# Patient Record
Sex: Female | Born: 1950 | Race: White | Hispanic: No | Smoking: Current every day smoker
Health system: Southern US, Community
[De-identification: ages and names within clinical notes are randomized; demographics above are authoritative.]

## PROBLEM LIST (undated history)

## (undated) DIAGNOSIS — J449 Chronic obstructive pulmonary disease, unspecified: Secondary | ICD-10-CM

## (undated) DIAGNOSIS — I739 Peripheral vascular disease, unspecified: Secondary | ICD-10-CM

## (undated) DIAGNOSIS — I2510 Atherosclerotic heart disease of native coronary artery without angina pectoris: Secondary | ICD-10-CM

## (undated) DIAGNOSIS — C801 Malignant (primary) neoplasm, unspecified: Secondary | ICD-10-CM

## (undated) DIAGNOSIS — K449 Diaphragmatic hernia without obstruction or gangrene: Secondary | ICD-10-CM

## (undated) DIAGNOSIS — M1990 Unspecified osteoarthritis, unspecified site: Secondary | ICD-10-CM

## (undated) DIAGNOSIS — I10 Essential (primary) hypertension: Secondary | ICD-10-CM

## (undated) DIAGNOSIS — I8290 Acute embolism and thrombosis of unspecified vein: Secondary | ICD-10-CM

## (undated) DIAGNOSIS — K219 Gastro-esophageal reflux disease without esophagitis: Secondary | ICD-10-CM

## (undated) DIAGNOSIS — I499 Cardiac arrhythmia, unspecified: Secondary | ICD-10-CM

## (undated) DIAGNOSIS — R0600 Dyspnea, unspecified: Secondary | ICD-10-CM

## (undated) DIAGNOSIS — R7303 Prediabetes: Secondary | ICD-10-CM

## (undated) DIAGNOSIS — I219 Acute myocardial infarction, unspecified: Secondary | ICD-10-CM

## (undated) DIAGNOSIS — E785 Hyperlipidemia, unspecified: Secondary | ICD-10-CM

## (undated) DIAGNOSIS — J439 Emphysema, unspecified: Secondary | ICD-10-CM

## (undated) DIAGNOSIS — F32A Depression, unspecified: Secondary | ICD-10-CM

## (undated) DIAGNOSIS — E119 Type 2 diabetes mellitus without complications: Secondary | ICD-10-CM

## (undated) HISTORY — PX: VASCULAR SURGERY: SHX849

## (undated) HISTORY — DX: Atherosclerotic heart disease of native coronary artery without angina pectoris: I25.10

## (undated) HISTORY — DX: Gastro-esophageal reflux disease without esophagitis: K21.9

## (undated) HISTORY — DX: Hyperlipidemia, unspecified: E78.5

## (undated) HISTORY — PX: ABDOMINAL AORTA STENT: SHX1108

## (undated) HISTORY — DX: Essential (primary) hypertension: I10

## (undated) HISTORY — PX: ABDOMINAL HYSTERECTOMY: SHX81

---

## 2003-06-18 HISTORY — PX: OTHER SURGICAL HISTORY: SHX169

## 2011-01-23 ENCOUNTER — Ambulatory Visit: Payer: Self-pay | Admitting: Adult Health

## 2011-06-26 ENCOUNTER — Ambulatory Visit: Payer: Self-pay

## 2014-09-28 DIAGNOSIS — I259 Chronic ischemic heart disease, unspecified: Secondary | ICD-10-CM | POA: Insufficient documentation

## 2014-09-28 LAB — HEMOGLOBIN A1C: Hemoglobin A1C: 6.6

## 2014-09-28 LAB — TSH: TSH: 1.51 u[IU]/mL (ref 0.41–5.90)

## 2015-03-22 ENCOUNTER — Other Ambulatory Visit: Payer: Self-pay

## 2015-03-22 LAB — CBC AND DIFFERENTIAL
HCT: 42 % (ref 36–46)
Hemoglobin: 14.7 g/dL (ref 12.0–16.0)
NEUTROS ABS: 5 /uL
Platelets: 257 10*3/uL (ref 150–399)
WBC: 9.9 10^3/mL

## 2015-03-22 LAB — BASIC METABOLIC PANEL
BUN: 13 mg/dL (ref 4–21)
Creatinine: 0.6 mg/dL (ref 0.5–1.1)
GLUCOSE: 115 mg/dL
POTASSIUM: 4.2 mmol/L (ref 3.4–5.3)
SODIUM: 142 mmol/L (ref 137–147)

## 2015-03-22 LAB — HEPATIC FUNCTION PANEL
ALT: 19 U/L (ref 7–35)
AST: 17 U/L (ref 13–35)
Alkaline Phosphatase: 73 U/L (ref 25–125)
BILIRUBIN, TOTAL: 0.4 mg/dL

## 2015-03-22 LAB — LIPID PANEL
Cholesterol: 169 mg/dL (ref 0–200)
HDL: 28 mg/dL — AB (ref 35–70)
LDL CALC: 100 mg/dL
Triglycerides: 205 mg/dL — AB (ref 40–160)

## 2015-03-29 ENCOUNTER — Ambulatory Visit: Payer: Self-pay | Admitting: Internal Medicine

## 2015-03-29 DIAGNOSIS — E785 Hyperlipidemia, unspecified: Secondary | ICD-10-CM | POA: Insufficient documentation

## 2015-03-29 DIAGNOSIS — I10 Essential (primary) hypertension: Secondary | ICD-10-CM | POA: Insufficient documentation

## 2015-03-29 DIAGNOSIS — Z720 Tobacco use: Secondary | ICD-10-CM | POA: Insufficient documentation

## 2015-11-02 ENCOUNTER — Other Ambulatory Visit: Payer: Self-pay | Admitting: Family Medicine

## 2015-11-02 DIAGNOSIS — Z1239 Encounter for other screening for malignant neoplasm of breast: Secondary | ICD-10-CM

## 2015-12-13 DIAGNOSIS — E785 Hyperlipidemia, unspecified: Secondary | ICD-10-CM

## 2015-12-13 DIAGNOSIS — I10 Essential (primary) hypertension: Secondary | ICD-10-CM

## 2015-12-13 DIAGNOSIS — I259 Chronic ischemic heart disease, unspecified: Secondary | ICD-10-CM

## 2015-12-13 DIAGNOSIS — Z720 Tobacco use: Secondary | ICD-10-CM

## 2016-09-19 ENCOUNTER — Telehealth: Payer: Self-pay | Admitting: *Deleted

## 2016-09-19 DIAGNOSIS — Z87891 Personal history of nicotine dependence: Secondary | ICD-10-CM

## 2016-09-19 NOTE — Telephone Encounter (Signed)
Received referral for initial lung cancer screening scan. Contacted patient and obtained smoking history,(current, 60 pack year) as well as answering questions related to screening process. Patient denies signs of lung cancer such as weight loss or hemoptysis. Patient denies comorbidity that would prevent curative treatment if lung cancer were found. Patient is tentatively scheduled for shared decision making visit and CT scan on 09/25/16, pending insurance approval from business office.

## 2016-09-25 ENCOUNTER — Encounter: Payer: Self-pay | Admitting: Oncology

## 2016-09-25 ENCOUNTER — Ambulatory Visit
Admission: RE | Admit: 2016-09-25 | Discharge: 2016-09-25 | Disposition: A | Discharge status: Home / Self Care | Payer: Medicare Other | Source: Ambulatory Visit | Attending: Oncology | Admitting: Oncology

## 2016-09-25 ENCOUNTER — Inpatient Hospital Stay: Payer: Medicare Other | Attending: Oncology | Admitting: Oncology

## 2016-09-25 DIAGNOSIS — Z122 Encounter for screening for malignant neoplasm of respiratory organs: Secondary | ICD-10-CM | POA: Diagnosis not present

## 2016-09-25 DIAGNOSIS — F17210 Nicotine dependence, cigarettes, uncomplicated: Secondary | ICD-10-CM | POA: Diagnosis not present

## 2016-09-25 DIAGNOSIS — I700 Atherosclerosis of aorta: Secondary | ICD-10-CM | POA: Insufficient documentation

## 2016-09-25 DIAGNOSIS — Z87891 Personal history of nicotine dependence: Secondary | ICD-10-CM | POA: Diagnosis present

## 2016-09-25 DIAGNOSIS — I2510 Atherosclerotic heart disease of native coronary artery without angina pectoris: Secondary | ICD-10-CM | POA: Insufficient documentation

## 2016-09-25 DIAGNOSIS — J439 Emphysema, unspecified: Secondary | ICD-10-CM | POA: Insufficient documentation

## 2016-09-25 NOTE — Assessment & Plan Note (Signed)
In accordance with CMS guidelines, patient has met eligibility criteria including age, absence of signs or symptoms of lung cancer.  Social History  Substance Use Topics  . Smoking status: Current Every Day Smoker    Packs/day: 1.00    Years: 60.00    Types: Cigarettes  . Smokeless tobacco: Not on file  . Alcohol use No     A shared decision-making session was conducted prior to the performance of CT scan. This includes one or more decision aids, includes benefits and harms of screening, follow-up diagnostic testing, over-diagnosis, false positive rate, and total radiation exposure.  Counseling on the importance of adherence to annual lung cancer LDCT screening, impact of co-morbidities, and ability or willingness to undergo diagnosis and treatment is imperative for compliance of the program.  Counseling on the importance of continued smoking cessation for former smokers; the importance of smoking cessation for current smokers, and information about tobacco cessation interventions have been given to patient including [place] and [facility] quit [place] programs.  Written order for lung cancer screening with LDCT has been given to the patient and any and all questions have been answered to the best of my abilities.   Yearly follow up will be coordinated by Burgess Estelle, Thoracic Navigator.

## 2016-09-25 NOTE — Progress Notes (Signed)
Personal history of tobacco use, presenting hazards to health In accordance with CMS guidelines, patient has met eligibility criteria including age, absence of signs or symptoms of lung cancer.  Social History  Substance Use Topics  . Smoking status: Current Every Day Smoker    Packs/day: 1.00    Years: 60.00    Types: Cigarettes  . Smokeless tobacco: Not on file  . Alcohol use No     A shared decision-making session was conducted prior to the performance of CT scan. This includes one or more decision aids, includes benefits and harms of screening, follow-up diagnostic testing, over-diagnosis, false positive rate, and total radiation exposure.  Counseling on the importance of adherence to annual lung cancer LDCT screening, impact of co-morbidities, and ability or willingness to undergo diagnosis and treatment is imperative for compliance of the program.  Counseling on the importance of continued smoking cessation for former smokers; the importance of smoking cessation for current smokers, and information about tobacco cessation interventions have been given to patient including [place] and [facility] quit [place] programs.  Written order for lung cancer screening with LDCT has been given to the patient and any and all questions have been answered to the best of my abilities.   Yearly follow up will be coordinated by Burgess Estelle, Thoracic Navigator.   Lucendia Herrlich, NP  09/25/16 3:17 PM

## 2016-09-27 ENCOUNTER — Ambulatory Visit (INDEPENDENT_AMBULATORY_CARE_PROVIDER_SITE_OTHER): Payer: Medicare Other | Admitting: Vascular Surgery

## 2016-09-27 ENCOUNTER — Telehealth: Payer: Self-pay | Admitting: *Deleted

## 2016-09-27 ENCOUNTER — Encounter (INDEPENDENT_AMBULATORY_CARE_PROVIDER_SITE_OTHER): Payer: Self-pay | Admitting: Vascular Surgery

## 2016-09-27 VITALS — BP 145/75 | HR 70 | Resp 17 | Ht 63.0 in | Wt 157.0 lb

## 2016-09-27 DIAGNOSIS — Z720 Tobacco use: Secondary | ICD-10-CM

## 2016-09-27 DIAGNOSIS — I6529 Occlusion and stenosis of unspecified carotid artery: Secondary | ICD-10-CM | POA: Diagnosis not present

## 2016-09-27 DIAGNOSIS — I70219 Atherosclerosis of native arteries of extremities with intermittent claudication, unspecified extremity: Secondary | ICD-10-CM | POA: Insufficient documentation

## 2016-09-27 DIAGNOSIS — F17210 Nicotine dependence, cigarettes, uncomplicated: Secondary | ICD-10-CM | POA: Diagnosis not present

## 2016-09-27 DIAGNOSIS — I10 Essential (primary) hypertension: Secondary | ICD-10-CM

## 2016-09-27 DIAGNOSIS — I70213 Atherosclerosis of native arteries of extremities with intermittent claudication, bilateral legs: Secondary | ICD-10-CM | POA: Diagnosis not present

## 2016-09-27 DIAGNOSIS — E785 Hyperlipidemia, unspecified: Secondary | ICD-10-CM

## 2016-09-27 NOTE — Patient Instructions (Signed)
Peripheral Vascular Disease Peripheral vascular disease (PVD) is a disease of the blood vessels that are not part of your heart and brain. A simple term for PVD is poor circulation. In most cases, PVD narrows the blood vessels that carry blood from your heart to the rest of your body. This can result in a decreased supply of blood to your arms, legs, and internal organs, like your stomach or kidneys. However, it most often affects a person's lower legs and feet. There are two types of PVD.  Organic PVD. This is the more common type. It is caused by damage to the structure of blood vessels.  Functional PVD. This is caused by conditions that make blood vessels contract and tighten (spasm).  Without treatment, PVD tends to get worse over time. PVD can also lead to acute ischemic limb. This is when an arm or limb suddenly has trouble getting enough blood. This is a medical emergency. What are the causes? Each type of PVD has many different causes. The most common cause of PVD is buildup of a fatty material (plaque) inside of your arteries (atherosclerosis). Small amounts of plaque can break off from the walls of the blood vessels and become lodged in a smaller artery. This blocks blood flow and can cause acute ischemic limb. Other common causes of PVD include:  Blood clots that form inside of blood vessels.  Injuries to blood vessels.  Diseases that cause inflammation of blood vessels or cause blood vessel spasms.  Health behaviors and health history that increase your risk of developing PVD.  What increases the risk? You may have a greater risk of PVD if you:  Have a family history of PVD.  Have certain medical conditions, including: ? High cholesterol. ? Diabetes. ? High blood pressure (hypertension). ? Coronary heart disease. ? Past problems with blood clots. ? Past injury, such as burns or a broken bone. These may have damaged blood vessels in your limbs. ? Buerger disease. This is  caused by inflamed blood vessels in your hands and feet. ? Some forms of arthritis. ? Rare birth defects that affect the arteries in your legs.  Use tobacco.  Do not get enough exercise.  Are obese.  Are age 50 or older.  What are the signs or symptoms? PVD may cause many different symptoms. Your symptoms depend on what part of your body is not getting enough blood. Some common signs and symptoms include:  Cramps in your lower legs. This may be a symptom of poor leg circulation (claudication).  Pain and weakness in your legs while you are physically active that goes away when you rest (intermittent claudication).  Leg pain when at rest.  Leg numbness, tingling, or weakness.  Coldness in a leg or foot, especially when compared with the other leg.  Skin or hair changes. These can include: ? Hair loss. ? Shiny skin. ? Pale or bluish skin. ? Thick toenails.  Inability to get or maintain an erection (erectile dysfunction).  People with PVD are more prone to developing ulcers and sores on their toes, feet, or legs. These may take longer than normal to heal. How is this diagnosed? Your health care provider may diagnose PVD from your signs and symptoms. The health care provider will also do a physical exam. You may have tests to find out what is causing your PVD and determine its severity. Tests may include:  Blood pressure recordings from your arms and legs and measurements of the strength of your pulses (  pulse volume recordings).  Imaging studies using sound waves to take pictures of the blood flow through your blood vessels (Doppler ultrasound).  Injecting a dye into your blood vessels before having imaging studies using: ? X-rays (angiogram or arteriogram). ? Computer-generated X-rays (CT angiogram). ? A powerful electromagnetic field and a computer (magnetic resonance angiogram or MRA).  How is this treated? Treatment for PVD depends on the cause of your condition and the  severity of your symptoms. It also depends on your age. Underlying causes need to be treated and controlled. These include long-lasting (chronic) conditions, such as diabetes, high cholesterol, and high blood pressure. You may need to first try making lifestyle changes and taking medicines. Surgery may be needed if these do not work. Lifestyle changes may include:  Quitting smoking.  Exercising regularly.  Following a low-fat, low-cholesterol diet.  Medicines may include:  Blood thinners to prevent blood clots.  Medicines to improve blood flow.  Medicines to improve your blood cholesterol levels.  Surgical procedures may include:  A procedure that uses an inflated balloon to open a blocked artery and improve blood flow (angioplasty).  A procedure to put in a tube (stent) to keep a blocked artery open (stent implant).  Surgery to reroute blood flow around a blocked artery (peripheral bypass surgery).  Surgery to remove dead tissue from an infected wound on the affected limb.  Amputation. This is surgical removal of the affected limb. This may be necessary in cases of acute ischemic limb that are not improved through medical or surgical treatments.  Follow these instructions at home:  Take medicines only as directed by your health care provider.  Do not use any tobacco products, including cigarettes, chewing tobacco, or electronic cigarettes. If you need help quitting, ask your health care provider.  Lose weight if you are overweight, and maintain a healthy weight as directed by your health care provider.  Eat a diet that is low in fat and cholesterol. If you need help, ask your health care provider.  Exercise regularly. Ask your health care provider to suggest some good activities for you.  Use compression stockings or other mechanical devices as directed by your health care provider.  Take good care of your feet. ? Wear comfortable shoes that fit well. ? Check your feet  often for any cuts or sores. Contact a health care provider if:  You have cramps in your legs while walking.  You have leg pain when you are at rest.  You have coldness in a leg or foot.  Your skin changes.  You have erectile dysfunction.  You have cuts or sores on your feet that are not healing. Get help right away if:  Your arm or leg turns cold and blue.  Your arms or legs become red, warm, swollen, painful, or numb.  You have chest pain or trouble breathing.  You suddenly have weakness in your face, arm, or leg.  You become very confused or lose the ability to speak.  You suddenly have a very bad headache or lose your vision. This information is not intended to replace advice given to you by your health care provider. Make sure you discuss any questions you have with your health care provider. Document Released: 07/11/2004 Document Revised: 11/09/2015 Document Reviewed: 11/11/2013 Elsevier Interactive Patient Education  2017 [employer]  

## 2016-09-27 NOTE — Telephone Encounter (Signed)
Notified patient of LDCT lung cancer screening results with recommendation for 12 month follow up imaging. Also notified of incidental finding noted below and is encouraged to discuss further with PCP who will receive a copy of these results. Patient verbalizes understanding.   IMPRESSION: 1. Lung-RADS Category 2, benign appearance or behavior. Continue annual screening with low-dose chest CT without contrast in 12 months 2. Aortic atherosclerosis and coronary artery calcification 3. Diffuse bronchial wall thickening with emphysema, as above; imaging findings suggestive of underlying COPD.

## 2016-09-27 NOTE — Assessment & Plan Note (Signed)
The patient describes short distance claudication of the lower extremities. The left leg may be the more severely affected of the 2 legs, but both are bothersome to her. She has a previous history of what sounds like aortoiliac stent placement for short distance claudication. I would be concerned about recurrent stenosis or new disease above or below the previous interventions. Those records are not available as they were over a decade ago in [place]. We will obtain ABIs in the near future at her convenience. If this confirms diminished perfusion of the lower extremities with her highly symptomatic state, we would likely then proceed with an angiogram.

## 2016-09-27 NOTE — Assessment & Plan Note (Signed)
The patient reports that she has previous history of carotid disease and has not had a duplex in several years. She does have a right carotid bruit. Although she does not have any focal neurologic symptoms, I think this needs to be rechecked for further evaluation.

## 2016-09-27 NOTE — Assessment & Plan Note (Signed)
lipid control important in reducing the progression of atherosclerotic disease. Continue statin therapy  

## 2016-09-27 NOTE — Assessment & Plan Note (Signed)
blood pressure control important in reducing the progression of atherosclerotic disease. On appropriate oral medications.  

## 2016-09-27 NOTE — Assessment & Plan Note (Signed)
We had a discussion for approximately 3-4 minutes regarding the absolute need for smoking cessation due to the deleterious nature of tobacco on the vascular system. We discussed the tobacco use would diminish patency of any intervention, and likely significantly worsen progressio of disease. We discussed multiple agents for quitting including replacement therapy or medications to reduce cravings such as Chantix. The patient voices their understanding of the importance of smoking cessation.  

## 2016-09-27 NOTE — Progress Notes (Signed)
Patient ID: Mariah Rogers, female   DOB: 1951/06/02, 66 y.o.   MRN: [id number]  Chief Complaint  Patient presents with  . New Patient (Initial Visit)    HPI Mariah Rogers is a 65 y.o. female.  Patient present for evaluation of PAD with claudication. The patient reports that 13 years ago she had what sounds like an aortoiliac intervention with stent placement for very short distance claudication. She says she then did well for 10 or 11 years but over the past year or 2 she has noticed worsening pain in her legs with activity. The onset has been gradual. The symptoms are pain and cramping that start in her hip and thighs and radiates down her legs. The left lower extremity is more severely affected of the 2 legs. She has no ulceration or infection. She denies fever or chills. She is now becoming disabled from the pain and having difficulty even with minor tasks. The patient also reports a lot of neck pain and spasms. She is very concerned about this because she was told some years ago that she had blockage in her carotid arteries but that was not bad enough to need surgery. She does not have stroke or TIA symptoms. Specifically, the patient denies amaurosis fugax, speech or swallowing difficulties, or arm or leg weakness or numbness. She continues to smoke.   Current Outpatient Prescriptions  Medication Sig Dispense Refill  . cholecalciferol (VITAMIN D) 1000 units tablet Take 1,000 Units by mouth daily.    Marland Kitchen ezetimibe-simvastatin (VYTORIN) 10-40 MG tablet Take 1 tablet by mouth daily.    Marland Kitchen lisinopril (PRINIVIL,ZESTRIL) 20 MG tablet Take 20 mg by mouth daily.    Marland Kitchen omeprazole (PRILOSEC) 20 MG capsule Take 20 mg by mouth daily.    . sertraline (ZOLOFT) 50 MG tablet Take 50 mg by mouth at bedtime.     No current facility-administered medications for this visit.      Past Medical History:  Diagnosis Date  . CAD (coronary artery disease)   . GERD (gastroesophageal reflux disease)   .  Hyperlipidemia   . Hypertension     Past Surgical History:  Procedure Laterality Date  . ABDOMINAL HYSTERECTOMY    . aortobifemoral bypass  2005  . CESAREAN SECTION     x 3    Family History  Problem Relation Age of Onset  . Osteoporosis Mother   . Alzheimer's disease Mother   Brother with peripheral arterial disease No bleeding or clotting disorders  Social History Social History  Substance Use Topics  . Smoking status: Current Every Day Smoker    Packs/day: 1.00    Years: 60.00    Types: Cigarettes  . Smokeless tobacco: Never Used  . Alcohol use No   No IV drug use   Allergies  Allergen Reactions  . Codeine         REVIEW OF SYSTEMS (Negative unless checked)  Constitutional: [] Weight loss  [] Fever  [] Chills Cardiac: [] Chest pain   [] Chest pressure   [] Palpitations   [] Shortness of breath when laying flat   [] Shortness of breath at rest   [] Shortness of breath with exertion. Vascular:  [x] Pain in legs with walking   [] Pain in legs at rest   [] Pain in legs when laying flat   [x] Claudication   [] Pain in feet when walking  [] Pain in feet at rest  [] Pain in feet when laying flat   [] History of DVT   [] Phlebitis   [] Swelling in legs   []   Varicose veins   [] Non-healing ulcers Pulmonary:   [] Uses home oxygen   [] Productive cough   [] Hemoptysis   [] Wheeze  [] COPD   [] Asthma Neurologic:  [] Dizziness  [] Blackouts   [] Seizures   [] History of stroke   [] History of TIA  [] Aphasia   [] Temporary blindness   [] Dysphagia   [] Weakness or numbness in arms   [x] Weakness or numbness in legs Musculoskeletal:  [x] Arthritis   [] Joint swelling   [] Joint pain   [] Low back pain Hematologic:  [] Easy bruising  [] Easy bleeding   [] Hypercoagulable state   [] Anemic  [] Hepatitis  Gastrointestinal:  [] Blood in stool   [] Vomiting blood  [] Gastroesophageal reflux/heartburn   [] Difficulty swallowing   Genitourinary:  [] Chronic kidney disease   [] Difficult urination  [] Frequent urination  [] Burning with  urination   [] Blood in urine Skin:  [] Rashes   [] Ulcers   [] Wounds Psychological:  [] History of anxiety   []  History of major depression.  Physical Exam BP (!) 145/75   Pulse 70   Resp 17   Ht 5\' 3"  (1.6 m)   Wt 71.2 kg (157 lb)   BMI 27.81 kg/m   Gen:  WD/WN, NAD. Appears older than stated age Head: [place]/AT, No temporalis wasting.  Ear/Nose/Throat: Hearing grossly intact, nares w/o erythema or drainage, oropharynx w/o Erythema/Exudate Eyes: Sclera non-icteric, conjunctiva clear Neck: Trachea midline.  No JVD.  Pulmonary:  Good air movement, no use of accessory muscles.  Cardiac: RRR, normal S1, S2. Vascular:  Vessel Right Left  Radial Palpable Palpable  Ulnar Palpable Palpable  Brachial Palpable Palpable  Carotid Palpable, with bruit Palpable, without bruit  Aorta Not palpable N/A  Femoral Palpable 1+ Palpable  Popliteal 1+ Palpable Not Palpable  PT 1+ Palpable 1+ Palpable  DP 1+ Palpable Not Palpable   Gastrointestinal: soft, non-tender/non-distended. Musculoskeletal: M/S 5/5 throughout. No deformity or atrophy. No edema. Varicosities scattered  Neurologic:  Sensation grossly intact in extremities.  Symmetrical.  Speech is fluent. Motor exam as listed above. Psychiatric: Judgment intact, Mood & affect appropriate for pt's clinical situation. Dermatologic: No rashes or ulcers noted.  No cellulitis or open wounds.  Radiology Ct Chest Lung Cancer Screening Low Dose Wo Contrast  Result Date: 09/26/2016 CLINICAL DATA:  Lung cancer screening. Sixty pack-year history. Asymptomatic current smoker. EXAM: CT CHEST WITHOUT CONTRAST LOW-DOSE FOR LUNG CANCER SCREENING TECHNIQUE: Multidetector CT imaging of the chest was performed following the standard protocol without IV contrast. COMPARISON:  None. FINDINGS: Cardiovascular: Normal heart size. No pericardial effusion. Aortic atherosclerosis noted. Calcification within the LAD, RCA and left circumflex coronary artery noted.  Mediastinum/Nodes: Small hiatal hernia. The trachea appears patent and is midline. No enlarged mediastinal or hilar lymph nodes. Lungs/Pleura: Moderate changes of centrilobular emphysema. Diffuse bronchial wall thickening noted. Pulmonary nodule within the right middle lobe has an equivalent diameter of 3.6 mm. Upper Abdomen: No acute findings within the upper abdomen. Nodule in the left adrenal gland measures 1.6 cm and has an attenuation characteristics of a benign adenoma. Musculoskeletal: Spondylosis noted within the thoracic spine. No aggressive lytic or sclerotic bone lesion. IMPRESSION: 1. Lung-RADS Category 2, benign appearance or behavior. Continue annual screening with low-dose chest CT without contrast in 12 months 2. Aortic atherosclerosis and coronary artery calcification 3. Diffuse bronchial wall thickening with emphysema, as above; imaging findings suggestive of underlying COPD. Electronically Signed   By: Kerby Moors M.D.   On: 09/26/2016 08:46    Labs No results found for this or any previous visit (from the past  2160 hour(s)).  Assessment/Plan:  Tobacco abuse We had a discussion for approximately 3-4 minutes regarding the absolute need for smoking cessation due to the deleterious nature of tobacco on the vascular system. We discussed the tobacco use would diminish patency of any intervention, and likely significantly worsen progressio of disease. We discussed multiple agents for quitting including replacement therapy or medications to reduce cravings such as Chantix. The patient voices their understanding of the importance of smoking cessation.   Hypertension blood pressure control important in reducing the progression of atherosclerotic disease. On appropriate oral medications.   Carotid stenosis The patient reports that she has previous history of carotid disease and has not had a duplex in several years. She does have a right carotid bruit. Although she does not have any  focal neurologic symptoms, I think this needs to be rechecked for further evaluation.  Hyperlipidemia lipid control important in reducing the progression of atherosclerotic disease. Continue statin therapy   Atherosclerosis of native arteries of extremity with intermittent claudication [facility]) The patient describes short distance claudication of the lower extremities. The left leg may be the more severely affected of the 2 legs, but both are bothersome to her. She has a previous history of what sounds like aortoiliac stent placement for short distance claudication. I would be concerned about recurrent stenosis or new disease above or below the previous interventions. Those records are not available as they were over a decade ago in [place]. We will obtain ABIs in the near future at her convenience. If this confirms diminished perfusion of the lower extremities with her highly symptomatic state, we would likely then proceed with an angiogram.     Leotis Pain 09/27/2016, 9:50 AM   This note was created with [employer] dictation system.  Any errors from dictation are unintentional.

## 2016-09-30 ENCOUNTER — Ambulatory Visit (INDEPENDENT_AMBULATORY_CARE_PROVIDER_SITE_OTHER): Payer: Medicare Other

## 2016-09-30 ENCOUNTER — Encounter (INDEPENDENT_AMBULATORY_CARE_PROVIDER_SITE_OTHER): Payer: Self-pay | Admitting: Vascular Surgery

## 2016-09-30 ENCOUNTER — Ambulatory Visit (INDEPENDENT_AMBULATORY_CARE_PROVIDER_SITE_OTHER): Payer: Medicare Other | Admitting: Vascular Surgery

## 2016-09-30 VITALS — BP 131/76 | HR 69 | Resp 17

## 2016-09-30 DIAGNOSIS — I70213 Atherosclerosis of native arteries of extremities with intermittent claudication, bilateral legs: Secondary | ICD-10-CM | POA: Diagnosis not present

## 2016-09-30 DIAGNOSIS — Z720 Tobacco use: Secondary | ICD-10-CM

## 2016-09-30 DIAGNOSIS — I739 Peripheral vascular disease, unspecified: Secondary | ICD-10-CM | POA: Diagnosis not present

## 2016-09-30 DIAGNOSIS — E785 Hyperlipidemia, unspecified: Secondary | ICD-10-CM | POA: Diagnosis not present

## 2016-09-30 NOTE — Progress Notes (Signed)
Subjective:    Patient ID: Mariah Rogers, female    DOB: May 03, 1951, 66 y.o.   MRN: [id number] Chief Complaint  Patient presents with  . Follow-up   Patient presents to review vascular studies. She was last seen on 09/27/16 complaining of worsening left lower extremity claudication. Her symptoms remain and have not changed since her last visit. The patient reports that 13 years ago she had what sounds like an aortoiliac intervention with stent placement for very short distance claudication. She says she then did well for 10 or 11 years but over the past year or 2 she has noticed worsening pain in her legs with activity. The onset has been gradual. The symptoms are pain and cramping that start in her hip and thighs and radiates down her legs. The left lower extremity is more severely affected of the 2 legs. She has no ulceration or infection. She denies fever or chills. She is now becoming disabled from the pain and having difficulty even with minor tasks. She continues to smoke however she is making a concerted. The patient underwent an ABI which showed no right lower extremity atherosclerotic disease however was notable for moderate left lower extremity disease with possible left limb occlusion.    Review of Systems  Constitutional: Negative.   HENT: Negative.   Eyes: Negative.   Respiratory: Negative.   Cardiovascular:       Lower Extremity Pain  Gastrointestinal: Negative.   Endocrine: Negative.   Genitourinary: Negative.   Musculoskeletal: Negative.   Skin: Negative.   Allergic/Immunologic: Negative.   Neurological: Negative.   Hematological: Negative.   Psychiatric/Behavioral: Negative.       Objective:   Physical Exam  Constitutional: She is oriented to person, place, and time. She appears well-developed and well-nourished. No distress.  HENT:  Head: Normocephalic and atraumatic.  Eyes: Conjunctivae are normal. Pupils are equal, round, and reactive to light.  Neck: Normal  range of motion.  Cardiovascular: Normal rate, regular rhythm, normal heart sounds and intact distal pulses.   Pulses:      Radial pulses are 2+ on the right side, and 2+ on the left side.       Dorsalis pedis pulses are 2+ on the right side, and 0 on the left side.       Posterior tibial pulses are 2+ on the right side, and 0 on the left side.  Pulmonary/Chest: Effort normal.  Musculoskeletal: Normal range of motion. She exhibits no edema.  Neurological: She is alert and oriented to person, place, and time.  Skin: Skin is warm and dry. She is not diaphoretic.  Psychiatric: She has a normal mood and affect. Her behavior is normal. Judgment and thought content normal.  Vitals reviewed.  BP 131/76   Pulse 69   Resp 17   Past Medical History:  Diagnosis Date  . CAD (coronary artery disease)   . GERD (gastroesophageal reflux disease)   . Hyperlipidemia   . Hypertension    Social History   Social History  . Marital status: Widowed    Spouse name: N/A  . Number of children: N/A  . Years of education: N/A   Occupational History  . Not on file.   Social History Main Topics  . Smoking status: Current Every Day Smoker    Packs/day: 1.00    Years: 60.00    Types: Cigarettes  . Smokeless tobacco: Never Used  . Alcohol use No  . Drug use: No  Comment: Former marijuana use  . Sexual activity: Not on file   Other Topics Concern  . Not on file   Social History Narrative  . No narrative on file   Past Surgical History:  Procedure Laterality Date  . ABDOMINAL HYSTERECTOMY    . aortobifemoral bypass  2005  . CESAREAN SECTION     x 3   Family History  Problem Relation Age of Onset  . Osteoporosis Mother   . Alzheimer's disease Mother    Allergies  Allergen Reactions  . Codeine       Assessment & Plan:  Patient presents to review vascular studies. She was last seen on 09/27/16 complaining of worsening left lower extremity claudication. Her symptoms remain and have  not changed since her last visit. The patient reports that 13 years ago she had what sounds like an aortoiliac intervention with stent placement for very short distance claudication. She says she then did well for 10 or 11 years but over the past year or 2 she has noticed worsening pain in her legs with activity. The onset has been gradual. The symptoms are pain and cramping that start in her hip and thighs and radiates down her legs. The left lower extremity is more severely affected of the 2 legs. She has no ulceration or infection. She denies fever or chills. She is now becoming disabled from the pain and having difficulty even with minor tasks. She continues to smoke however she is making a concerted. The patient underwent an ABI which showed no right lower extremity atherosclerotic disease however was notable for moderate left lower extremity disease with possible left limb occlusion.   1. PAD (peripheral artery disease) ([place]) - New Patient symptomatic to the point she is unable to function on a daily basis.  ABI with LLE moderate disease with possible occlusion of left limb. Recommend left lower extremity angiogram in an attempt to assess anatomy and revascularize the extremity if indicated.  Procedure, risks and benefits explained to patient. All questions answered. Patient wishes to proceed.  2. Hyperlipidemia, unspecified hyperlipidemia type - Stable Encouraged good control as its slows the progression of atherosclerotic disease  3. Tobacco abuse - Stable Patient is trying to quit. I have discussed (approximately 5 minutes) with the patient the role of tobacco in the pathogenesis of atherosclerosis and its effect on the progression of the disease, impact on the durability of interventions and its limitations on the formation of collateral pathways. I have recommended absolute tobacco cessation. I have discussed various options available for assistance with tobacco cessation including over the  counter methods (Nicotine gum, patch and lozenges). We also discussed prescription options (Chantix, Nicotine Inhaler / Nasal Spray). The patient is not interested in pursuing any prescription tobacco cessation options at this time. The patient voices their understanding.   Current Outpatient Prescriptions on File Prior to Visit  Medication Sig Dispense Refill  . cholecalciferol (VITAMIN D) 1000 units tablet Take 1,000 Units by mouth daily.    Marland Kitchen ezetimibe-simvastatin (VYTORIN) 10-40 MG tablet Take 1 tablet by mouth daily.    Marland Kitchen lisinopril (PRINIVIL,ZESTRIL) 20 MG tablet Take 20 mg by mouth daily.    Marland Kitchen omeprazole (PRILOSEC) 20 MG capsule Take 20 mg by mouth daily.    . sertraline (ZOLOFT) 50 MG tablet Take 50 mg by mouth at bedtime.     No current facility-administered medications on file prior to visit.     There are no Patient Instructions on file for this visit. No  Follow-up on file.   Chanice Brenton A Foch Rosenwald, PA-C

## 2016-10-01 ENCOUNTER — Encounter
Admission: RE | Admit: 2016-10-01 | Discharge: 2016-10-01 | Disposition: A | Discharge status: Home / Self Care | Payer: Medicare Other | Source: Ambulatory Visit | Attending: Vascular Surgery | Admitting: Vascular Surgery

## 2016-10-01 ENCOUNTER — Other Ambulatory Visit (INDEPENDENT_AMBULATORY_CARE_PROVIDER_SITE_OTHER): Payer: Self-pay | Admitting: Vascular Surgery

## 2016-10-01 DIAGNOSIS — Z8262 Family history of osteoporosis: Secondary | ICD-10-CM | POA: Insufficient documentation

## 2016-10-01 DIAGNOSIS — Z820 Family history of epilepsy and other diseases of the nervous system: Secondary | ICD-10-CM

## 2016-10-01 DIAGNOSIS — Z9889 Other specified postprocedural states: Secondary | ICD-10-CM

## 2016-10-01 DIAGNOSIS — F17210 Nicotine dependence, cigarettes, uncomplicated: Secondary | ICD-10-CM | POA: Insufficient documentation

## 2016-10-01 DIAGNOSIS — E785 Hyperlipidemia, unspecified: Secondary | ICD-10-CM

## 2016-10-01 DIAGNOSIS — Z90710 Acquired absence of both cervix and uterus: Secondary | ICD-10-CM | POA: Insufficient documentation

## 2016-10-01 DIAGNOSIS — K219 Gastro-esophageal reflux disease without esophagitis: Secondary | ICD-10-CM | POA: Insufficient documentation

## 2016-10-01 DIAGNOSIS — I2510 Atherosclerotic heart disease of native coronary artery without angina pectoris: Secondary | ICD-10-CM

## 2016-10-01 DIAGNOSIS — I6523 Occlusion and stenosis of bilateral carotid arteries: Secondary | ICD-10-CM

## 2016-10-01 DIAGNOSIS — Z79899 Other long term (current) drug therapy: Secondary | ICD-10-CM

## 2016-10-01 DIAGNOSIS — I70203 Unspecified atherosclerosis of native arteries of extremities, bilateral legs: Secondary | ICD-10-CM | POA: Insufficient documentation

## 2016-10-01 DIAGNOSIS — Z01812 Encounter for preprocedural laboratory examination: Secondary | ICD-10-CM

## 2016-10-01 DIAGNOSIS — Z885 Allergy status to narcotic agent status: Secondary | ICD-10-CM

## 2016-10-01 DIAGNOSIS — I10 Essential (primary) hypertension: Secondary | ICD-10-CM | POA: Insufficient documentation

## 2016-10-01 HISTORY — DX: Acute embolism and thrombosis of unspecified vein: I82.90

## 2016-10-01 HISTORY — DX: Peripheral vascular disease, unspecified: I73.9

## 2016-10-01 HISTORY — DX: Chronic obstructive pulmonary disease, unspecified: J44.9

## 2016-10-01 HISTORY — DX: Diaphragmatic hernia without obstruction or gangrene: K44.9

## 2016-10-01 LAB — CREATININE, SERUM
Creatinine, Ser: 0.71 mg/dL (ref 0.44–1.00)
GFR calc non Af Amer: >60 mL/min (ref >60)

## 2016-10-01 LAB — BUN: BUN: 21 mg/dL — ABNORMAL HIGH (ref 6–20)

## 2016-10-02 ENCOUNTER — Encounter
Admission: AD | Disposition: A | Discharge status: Home / Self Care | Payer: Self-pay | Source: Ambulatory Visit | Attending: Vascular Surgery

## 2016-10-02 ENCOUNTER — Inpatient Hospital Stay
Admission: AD | Admit: 2016-10-02 | Discharge: 2016-10-04 | DRG: 272 | Disposition: A | Discharge status: Home / Self Care | Payer: Medicare Other | Source: Ambulatory Visit | Attending: Vascular Surgery | Admitting: Vascular Surgery

## 2016-10-02 DIAGNOSIS — I998 Other disorder of circulatory system: Secondary | ICD-10-CM | POA: Diagnosis present

## 2016-10-02 DIAGNOSIS — I10 Essential (primary) hypertension: Secondary | ICD-10-CM | POA: Diagnosis present

## 2016-10-02 DIAGNOSIS — J449 Chronic obstructive pulmonary disease, unspecified: Secondary | ICD-10-CM | POA: Diagnosis present

## 2016-10-02 DIAGNOSIS — I739 Peripheral vascular disease, unspecified: Secondary | ICD-10-CM | POA: Diagnosis present

## 2016-10-02 DIAGNOSIS — Y832 Surgical operation with anastomosis, bypass or graft as the cause of abnormal reaction of the patient, or of later complication, without mention of misadventure at the time of the procedure: Secondary | ICD-10-CM | POA: Diagnosis present

## 2016-10-02 DIAGNOSIS — E785 Hyperlipidemia, unspecified: Secondary | ICD-10-CM | POA: Diagnosis present

## 2016-10-02 DIAGNOSIS — I2510 Atherosclerotic heart disease of native coronary artery without angina pectoris: Secondary | ICD-10-CM | POA: Diagnosis present

## 2016-10-02 DIAGNOSIS — Z79899 Other long term (current) drug therapy: Secondary | ICD-10-CM | POA: Diagnosis not present

## 2016-10-02 DIAGNOSIS — K219 Gastro-esophageal reflux disease without esophagitis: Secondary | ICD-10-CM | POA: Diagnosis present

## 2016-10-02 DIAGNOSIS — T82868A Thrombosis of vascular prosthetic devices, implants and grafts, initial encounter: Principal | ICD-10-CM | POA: Diagnosis present

## 2016-10-02 DIAGNOSIS — I70512 Atherosclerosis of nonautologous biological bypass graft(s) of the extremities with intermittent claudication, left leg: Secondary | ICD-10-CM | POA: Diagnosis not present

## 2016-10-02 HISTORY — PX: LOWER EXTREMITY ANGIOGRAPHY: CATH118251

## 2016-10-02 LAB — GLUCOSE, CAPILLARY: GLUCOSE-CAPILLARY: 222 mg/dL — AB (ref 65–99)

## 2016-10-02 LAB — CBC
HCT: 39.1 % (ref 35.0–47.0)
HEMATOCRIT: 38.9 % (ref 35.0–47.0)
HEMOGLOBIN: 13.4 g/dL (ref 12.0–16.0)
HEMOGLOBIN: 13.2 g/dL (ref 12.0–16.0)
MCH: 31.1 pg (ref 26.0–34.0)
MCH: 30.6 pg (ref 26.0–34.0)
MCHC: 34.3 g/dL (ref 32.0–36.0)
MCHC: 33.8 g/dL (ref 32.0–36.0)
MCV: 90.7 fL (ref 80.0–100.0)
MCV: 90.5 fL (ref 80.0–100.0)
Platelets: 207 10*3/uL (ref 150–440)
Platelets: 197 10*3/uL (ref 150–440)
RBC: 4.31 MIL/uL (ref 3.80–5.20)
RBC: 4.3 MIL/uL (ref 3.80–5.20)
RDW: 14.2 % (ref 11.5–14.5)
RDW: 14.3 % (ref 11.5–14.5)
WBC: 17.9 10*3/uL — AB (ref 3.6–11.0)
WBC: 16.5 10*3/uL — ABNORMAL HIGH (ref 3.6–11.0)

## 2016-10-02 LAB — HEPARIN LEVEL (UNFRACTIONATED): Heparin Unfractionated: <0.1 IU/mL — ABNORMAL LOW (ref 0.30–0.70)

## 2016-10-02 LAB — MRSA PCR SCREENING: MRSA by PCR: NEGATIVE

## 2016-10-02 LAB — FIBRINOGEN
FIBRINOGEN: 178 mg/dL — AB (ref 210–475)
Fibrinogen: 222 mg/dL (ref 210–475)

## 2016-10-02 SURGERY — LOWER EXTREMITY ANGIOGRAPHY
Anesthesia: Moderate Sedation | Laterality: Left

## 2016-10-02 MED ORDER — EZETIMIBE-SIMVASTATIN 10-40 MG PO TABS: 1.0000 | ORAL_TABLET | Freq: Every day | ORAL | Status: DC

## 2016-10-02 MED ORDER — HYDROCHLOROTHIAZIDE 12.5 MG PO CAPS
12.5000 mg | ORAL_CAPSULE | Freq: Every day | ORAL | Status: DC
  Administered 2016-10-04: 12.5 mg via ORAL
  Filled 2016-10-02: qty 1

## 2016-10-02 MED ORDER — HEPARIN SODIUM (PORCINE) 1000 UNIT/ML IJ SOLN
INTRAMUSCULAR | Status: DC | PRN
  Administered 2016-10-02: 2000 [IU] via INTRAVENOUS
  Administered 2016-10-02: 3000 [IU] via INTRAVENOUS

## 2016-10-02 MED ORDER — HYDROMORPHONE HCL 1 MG/ML IJ SOLN: 1.0000 mg | Freq: Once | INTRAMUSCULAR | Status: DC | PRN

## 2016-10-02 MED ORDER — HYDRALAZINE HCL 20 MG/ML IJ SOLN: 5.0000 mg | INTRAMUSCULAR | Status: DC | PRN

## 2016-10-02 MED ORDER — SORBITOL 70 % SOLN
30.0000 mL | Freq: Every day | Status: DC | PRN
  Filled 2016-10-02: qty 30

## 2016-10-02 MED ORDER — MAGNESIUM HYDROXIDE 400 MG/5ML PO SUSP: 30.0000 mL | Freq: Every day | ORAL | Status: DC | PRN

## 2016-10-02 MED ORDER — SODIUM CHLORIDE 0.9% FLUSH
3.0000 mL | Freq: Two times a day (BID) | INTRAVENOUS | Status: DC
  Administered 2016-10-02 – 2016-10-04 (×4): 3 mL via INTRAVENOUS

## 2016-10-02 MED ORDER — SODIUM CHLORIDE 0.9 % IV SOLN
0.5000 mg/h | INTRAVENOUS | Status: DC
  Filled 2016-10-02 (×3): qty 10

## 2016-10-02 MED ORDER — FLEET ENEMA 7-19 GM/118ML RE ENEM: 1.0000 | ENEMA | Freq: Once | RECTAL | Status: DC | PRN

## 2016-10-02 MED ORDER — LISINOPRIL 10 MG PO TABS: 20.0000 mg | ORAL_TABLET | Freq: Every day | ORAL | Status: DC

## 2016-10-02 MED ORDER — METHYLPREDNISOLONE SODIUM SUCC 125 MG IJ SOLR: 125.0000 mg | INTRAMUSCULAR | Status: DC | PRN

## 2016-10-02 MED ORDER — SODIUM CHLORIDE 0.9 % IV SOLN
0.5000 mg/h | INTRAVENOUS | Status: DC
  Administered 2016-10-02 – 2016-10-03 (×2): 0.5 mg/h
  Filled 2016-10-02 (×2): qty 10

## 2016-10-02 MED ORDER — MIDAZOLAM HCL 5 MG/5ML IJ SOLN
INTRAMUSCULAR | Status: AC
  Filled 2016-10-02: qty 5

## 2016-10-02 MED ORDER — ASPIRIN EC 81 MG PO TBEC
81.0000 mg | DELAYED_RELEASE_TABLET | Freq: Every day | ORAL | Status: DC
  Administered 2016-10-04: 81 mg via ORAL
  Filled 2016-10-02: qty 1

## 2016-10-02 MED ORDER — ONDANSETRON HCL 4 MG/2ML IJ SOLN
4.0000 mg | Freq: Four times a day (QID) | INTRAMUSCULAR | Status: DC | PRN
  Administered 2016-10-03 (×2): 4 mg via INTRAVENOUS
  Filled 2016-10-02 (×2): qty 2

## 2016-10-02 MED ORDER — DOCUSATE SODIUM 100 MG PO CAPS
100.0000 mg | ORAL_CAPSULE | Freq: Two times a day (BID) | ORAL | Status: DC
  Administered 2016-10-03 – 2016-10-04 (×2): 100 mg via ORAL
  Filled 2016-10-02 (×2): qty 1

## 2016-10-02 MED ORDER — PANTOPRAZOLE SODIUM 40 MG PO TBEC
40.0000 mg | DELAYED_RELEASE_TABLET | Freq: Every day | ORAL | Status: DC
  Administered 2016-10-04: 40 mg via ORAL
  Filled 2016-10-02: qty 1

## 2016-10-02 MED ORDER — VITAMIN D 1000 UNITS PO TABS
1000.0000 [IU] | ORAL_TABLET | Freq: Every day | ORAL | Status: DC
  Administered 2016-10-04: 1000 [IU] via ORAL
  Filled 2016-10-02: qty 1

## 2016-10-02 MED ORDER — HYDROCODONE-ACETAMINOPHEN 5-325 MG PO TABS
1.0000 | ORAL_TABLET | ORAL | Status: DC | PRN
  Administered 2016-10-02: 1 via ORAL

## 2016-10-02 MED ORDER — EZETIMIBE 10 MG PO TABS
10.0000 mg | ORAL_TABLET | Freq: Every evening | ORAL | Status: DC
  Filled 2016-10-02: qty 1

## 2016-10-02 MED ORDER — CEFAZOLIN IN D5W 1 GM/50ML IV SOLN
1.0000 g | Freq: Once | INTRAVENOUS | Status: AC
Start: 2016-10-02 — End: 2016-10-02
  Administered 2016-10-02: 1 g via INTRAVENOUS

## 2016-10-02 MED ORDER — ONDANSETRON HCL 4 MG/2ML IJ SOLN
4.0000 mg | Freq: Once | INTRAMUSCULAR | Status: AC
  Administered 2016-10-02: 4 mg via INTRAVENOUS

## 2016-10-02 MED ORDER — ONDANSETRON HCL 4 MG PO TABS: 4.0000 mg | ORAL_TABLET | Freq: Four times a day (QID) | ORAL | Status: DC | PRN

## 2016-10-02 MED ORDER — SERTRALINE HCL 50 MG PO TABS
50.0000 mg | ORAL_TABLET | Freq: Every day | ORAL | Status: DC
  Administered 2016-10-03: 50 mg via ORAL
  Filled 2016-10-02: qty 1

## 2016-10-02 MED ORDER — FENTANYL CITRATE (PF) 100 MCG/2ML IJ SOLN
INTRAMUSCULAR | Status: AC
  Filled 2016-10-02: qty 2

## 2016-10-02 MED ORDER — HYDROMORPHONE HCL 1 MG/ML IJ SOLN
INTRAMUSCULAR | Status: AC
  Administered 2016-10-02: 0.5 mg
  Filled 2016-10-02: qty 1

## 2016-10-02 MED ORDER — LISINOPRIL-HYDROCHLOROTHIAZIDE 20-12.5 MG PO TABS: 1.0000 | ORAL_TABLET | Freq: Every day | ORAL | Status: DC

## 2016-10-02 MED ORDER — MIDAZOLAM HCL 2 MG/2ML IJ SOLN
INTRAMUSCULAR | Status: DC | PRN
  Administered 2016-10-02: 2 mg via INTRAVENOUS
  Administered 2016-10-02: 1 mg via INTRAVENOUS

## 2016-10-02 MED ORDER — SODIUM CHLORIDE 0.9 % IV SOLN
1.0000 mg/h | INTRAVENOUS | Status: DC
  Administered 2016-10-02: 1 mg/h
  Filled 2016-10-02: qty 10

## 2016-10-02 MED ORDER — PROMETHAZINE HCL 25 MG/ML IJ SOLN
12.5000 mg | Freq: Once | INTRAMUSCULAR | Status: AC
  Administered 2016-10-02: 12.5 mg via INTRAVENOUS

## 2016-10-02 MED ORDER — ACETAMINOPHEN 650 MG RE SUPP
650.0000 mg | Freq: Four times a day (QID) | RECTAL | Status: DC | PRN
  Filled 2016-10-02: qty 1

## 2016-10-02 MED ORDER — SODIUM CHLORIDE 0.9 % IV SOLN: 1.0000 mg/h | INTRAVENOUS | Status: DC

## 2016-10-02 MED ORDER — MORPHINE SULFATE (PF) 4 MG/ML IV SOLN
2.0000 mg | INTRAVENOUS | Status: DC | PRN
  Administered 2016-10-03: 2 mg via INTRAVENOUS
  Filled 2016-10-02: qty 1

## 2016-10-02 MED ORDER — DEXTROSE-NACL 5-0.9 % IV SOLN
INTRAVENOUS | Status: DC
  Administered 2016-10-02 – 2016-10-03 (×3): via INTRAVENOUS

## 2016-10-02 MED ORDER — SIMVASTATIN 40 MG PO TABS: 40.0000 mg | ORAL_TABLET | Freq: Every day | ORAL | Status: DC

## 2016-10-02 MED ORDER — HYDROCODONE-ACETAMINOPHEN 5-325 MG PO TABS
ORAL_TABLET | ORAL | Status: AC
  Filled 2016-10-02: qty 1

## 2016-10-02 MED ORDER — FAMOTIDINE 20 MG PO TABS: 40.0000 mg | ORAL_TABLET | ORAL | Status: DC | PRN

## 2016-10-02 MED ORDER — MIDAZOLAM HCL 2 MG/2ML IJ SOLN: 1.0000 mg | INTRAMUSCULAR | Status: DC | PRN

## 2016-10-02 MED ORDER — ONDANSETRON HCL 4 MG/2ML IJ SOLN
INTRAMUSCULAR | Status: AC
  Administered 2016-10-02: 4 mg
  Filled 2016-10-02: qty 2

## 2016-10-02 MED ORDER — ALTEPLASE 2 MG IJ SOLR
1.0000 mg/h | INTRAMUSCULAR | Status: AC
  Administered 2016-10-02: 1 mg/h
  Filled 2016-10-02: qty 10

## 2016-10-02 MED ORDER — LABETALOL HCL 5 MG/ML IV SOLN: 10.0000 mg | INTRAVENOUS | Status: DC | PRN

## 2016-10-02 MED ORDER — SODIUM CHLORIDE 0.9 % IV SOLN: 250.0000 mL | INTRAVENOUS | Status: DC | PRN

## 2016-10-02 MED ORDER — FENTANYL CITRATE (PF) 100 MCG/2ML IJ SOLN
INTRAMUSCULAR | Status: DC | PRN
  Administered 2016-10-02: 50 ug via INTRAVENOUS
  Administered 2016-10-02: 25 ug via INTRAVENOUS

## 2016-10-02 MED ORDER — METOPROLOL TARTRATE 5 MG/5ML IV SOLN
5.0000 mg | Freq: Four times a day (QID) | INTRAVENOUS | Status: DC
  Administered 2016-10-04 (×2): 5 mg via INTRAVENOUS
  Filled 2016-10-02 (×2): qty 5

## 2016-10-02 MED ORDER — HEPARIN (PORCINE) IN NACL 100-0.45 UNIT/ML-% IJ SOLN
INTRAMUSCULAR | Status: AC
  Administered 2016-10-02: 600 [IU]/h via INTRAVENOUS
  Filled 2016-10-02: qty 250

## 2016-10-02 MED ORDER — SODIUM CHLORIDE 0.9% FLUSH: 3.0000 mL | Freq: Two times a day (BID) | INTRAVENOUS | Status: DC

## 2016-10-02 MED ORDER — PROMETHAZINE HCL 25 MG/ML IJ SOLN
INTRAMUSCULAR | Status: AC
  Administered 2016-10-02: 12.5 mg via INTRAVENOUS
  Filled 2016-10-02: qty 1

## 2016-10-02 MED ORDER — SODIUM CHLORIDE 0.9% FLUSH: 3.0000 mL | INTRAVENOUS | Status: DC | PRN

## 2016-10-02 MED ORDER — ONDANSETRON HCL 4 MG/2ML IJ SOLN: 4.0000 mg | Freq: Four times a day (QID) | INTRAMUSCULAR | Status: DC | PRN

## 2016-10-02 MED ORDER — ONDANSETRON HCL 4 MG/2ML IJ SOLN
4.0000 mg | Freq: Four times a day (QID) | INTRAMUSCULAR | Status: DC | PRN
  Administered 2016-10-03: 4 mg via INTRAVENOUS

## 2016-10-02 MED ORDER — SODIUM CHLORIDE 0.9 % IV SOLN
INTRAVENOUS | Status: DC | PRN
  Administered 2016-10-02: 500 mL via INTRAVENOUS

## 2016-10-02 MED ORDER — SODIUM CHLORIDE 0.9 % IV SOLN
INTRAVENOUS | Status: DC
  Administered 2016-10-02: 11:00:00 via INTRAVENOUS

## 2016-10-02 MED ORDER — ONDANSETRON HCL 4 MG/2ML IJ SOLN
INTRAMUSCULAR | Status: AC
  Filled 2016-10-02: qty 2

## 2016-10-02 MED ORDER — HEPARIN SODIUM (PORCINE) 1000 UNIT/ML IJ SOLN
INTRAMUSCULAR | Status: AC
  Filled 2016-10-02: qty 1

## 2016-10-02 MED ORDER — ALTEPLASE 2 MG IJ SOLR
INTRAMUSCULAR | Status: DC | PRN
  Administered 2016-10-02: 8 mg

## 2016-10-02 MED ORDER — HEPARIN (PORCINE) IN NACL 100-0.45 UNIT/ML-% IJ SOLN
1100.0000 [IU]/h | INTRAMUSCULAR | Status: DC
  Administered 2016-10-02: 600 [IU]/h via INTRAVENOUS
  Filled 2016-10-02: qty 250

## 2016-10-02 MED ORDER — ALTEPLASE 2 MG IJ SOLR
INTRAMUSCULAR | Status: AC
  Filled 2016-10-02: qty 8

## 2016-10-02 MED ORDER — ACETAMINOPHEN 325 MG PO TABS: 650.0000 mg | ORAL_TABLET | Freq: Four times a day (QID) | ORAL | Status: DC | PRN

## 2016-10-02 MED ORDER — IOPAMIDOL (ISOVUE-300) INJECTION 61%
INTRAVENOUS | Status: DC | PRN
  Administered 2016-10-02: 75 mL via INTRA_ARTERIAL

## 2016-10-02 MED ORDER — LIDOCAINE-EPINEPHRINE (PF) 2 %-1:200000 IJ SOLN
INTRAMUSCULAR | Status: AC
  Filled 2016-10-02: qty 20

## 2016-10-02 MED ORDER — LISINOPRIL 20 MG PO TABS
20.0000 mg | ORAL_TABLET | Freq: Every day | ORAL | Status: DC
  Administered 2016-10-04: 20 mg via ORAL
  Filled 2016-10-02: qty 1

## 2016-10-02 SURGICAL SUPPLY — 16 items
BALLN LUTONIX DCB 6X80X130 (BALLOONS) ×2
BALLN LUTONIX DCB 7X60X130 (BALLOONS) ×2
BALLOON LUTONIX DCB 6X80X130 (BALLOONS) ×1 IMPLANT
BALLOON LUTONIX DCB 7X60X130 (BALLOONS) ×1 IMPLANT
CATH 5FR PIG 90CM (CATHETERS) ×2 IMPLANT
CATH 5FR REUT (CATHETERS) ×2 IMPLANT
CATH INFUS 90CMX20CM (CATHETERS) ×2 IMPLANT
DEVICE PRESTO INFLATION (MISCELLANEOUS) ×2 IMPLANT
DEVICE SOLENT PROXI 90CM (CATHETERS) ×2 IMPLANT
GLIDECATH 4FR STR (CATHETERS) ×2 IMPLANT
GLIDEWIRE ADV .035X260CM (WIRE) ×2 IMPLANT
KIT CATH CVC 3 LUMEN 7FR 8IN (MISCELLANEOUS) ×2 IMPLANT
PACK ANGIOGRAPHY (CUSTOM PROCEDURE TRAY) ×2 IMPLANT
SHEATH BRITE TIP 5FRX11 (SHEATH) ×2 IMPLANT
SHEATH BRITE TIP 6FR X 23 (SHEATH) ×2 IMPLANT
WIRE J 3MM .035X145CM (WIRE) ×2 IMPLANT

## 2016-10-02 NOTE — Op Note (Signed)
[facility] Percutaneous Study/Intervention Procedural Note   Date of Surgery: 10/02/2016  Surgeon(s):Emmalea Treanor   Assistants:none  Pre-operative Diagnosis: PAD with claudication LLE  Post-operative diagnosis: Same  Procedure(s) Performed: 1. Ultrasound guidance for vascular access right femoral artery 2. Catheter placement into left SFA from right femoral approach 3. Aortogram and selective left lower extremity angiogram 4. Catheter directed thrombolytic therapy with 8 mg of TPA delivered and a power pulse spray fashion with the AngioJet proxy catheter to the left limb of the aortobifemoral bypass graft (analagous with the left iliac artery), left common femoral artery, and left proximal superficial femoral artery 5. Mechanical rheolytic thrombectomy with the AngioJet proxy catheter to the left limb of the aortobifemoral bypass graft, left common femoral artery, and left proximal superficial femoral artery  6.  Percutaneous transluminal angioplasty of the initial portion of the left limb of the aortobifemoral bypass graft (analogous with the proximal left common iliac artery) with a 7 mm diameter by 6 cm length Lutonix drug-coated angioplasty balloon 7. Percutaneous transluminal angioplasty of the distal portion of the left aortobifemoral bypass anastomosis to the left common femoral artery, left common femoral artery, and proximal left superficial femoral artery with a 6 mm diameter by 8 cm length Lutonix drug-coated angioplasty balloon  8.  Placement of an infusion catheter for overnight thrombolytic therapy into the left limb of the aortobifemoral bypass graft, left common femoral artery, and proximal left superficial femoral artery with a 90 cm total length 20 cm working length catheter  EBL: 10 cc  Contrast: 75 cc  Fluoro Time: 7.7 minutes  Moderate Conscious Sedation Time:  approximately 60 minutes using 3 mg of Versed and 75 mcg of Fentanyl  Indications: Patient is a 66 y.o.female with lifestyle limiting short distance claudication of the left leg with worsening symptoms. The patient has noninvasive study showing markedly reduced left ABI and likely occluded left limb of the aortabifemoral bypass graft. The patient is brought in for angiography for further evaluation and potential treatment. Risks and benefits are discussed and informed consent is obtained  Procedure: The patient was identified and appropriate procedural time out was performed. The patient was then placed supine on the table and prepped and draped in the usual sterile fashion.Moderate conscious sedation was administered during a face to face encounter with the patient throughout the procedure with my supervision of the RN administering medicines and monitoring the patient's vital signs, pulse oximetry, telemetry and mental status throughout from the start of the procedure until the patient was taken to the recovery room. Ultrasound was used to evaluate the right common femoral artery. It was patent at the level of the distal bypass anastomosis. A digital ultrasound image was acquired. A Seldinger needle was used to access the right common femoral artery under direct ultrasound guidance and a permanent image was performed. A 0.035 J wire was advanced without resistance and a 5Fr sheath was placed. Pigtail catheter was placed into the aorta and an AP aortogram was performed. The renal arteries had good flow with no stenosis in the right renal artery stenosis of less than 50% in the left renal artery. Proximal infrarenal aorta was patent with a aortobifemoral bypass graft plugged into the mid infrarenal aorta. The common trunk was patent and the right limb of the bypass graft was widely patent. The left limb of the aortobifemoral bypass was occluded. There reconstituted flow in the left common  femoral artery. I then crossed the aortic bifurcation and advanced to the  left femoral head using a RIM catheter. Selective left lower extremity angiogram was then performed. This demonstrated reasonably normal flow from the superficial femoral artery beyond its initial segment occlusion with a normal popliteal artery and what appeared to be two-vessel runoff distally although it was somewhat sluggish. The patient was systemically heparinized and a 6 French 21 cm sheath was then placed over the Terumo Advantage wire. The wire was parked well into the left lower extremity down to the popliteal artery after intraluminal flow was confirmed in the proximal superficial femoral artery beyond the occlusion. I then instilled 8 mg of TPA in a power pulse spray fashion with the AngioJet proxy catheter in the left limb of aortobifemoral bypass graft which was analogous to the left iliac artery, the left common femoral artery, and the proximal left superficial femoral artery. This was allowed to dwell for 20 minutes. Mechanical rheolytic thrombectomy was then performed with the AngioJet proxy catheter to the same area from the left limb of the aortobifemoral bypass graft, left common femoral artery, and proximal left superficial femoral artery. This uncovered what appeared to be thrombus and a high-grade stenosis at the origin of the limb of the bypass graft which would be analogous to the left proximal common iliac artery. There was also stenosis and thrombus at the distal bypass anastomosis into the left common femoral artery in the proximal portion of the superficial femoral artery. I performed angioplasty of the proximal portion of the bypass graft left limb which was analogous to the left proximal common iliac artery with a 7 mm diameter by 6 cm length Lutonix drug-coated angioplasty balloon. This was inflated to 12 atm for 1 minute. Then treated the distal bypass anastomosis, left common femoral artery, and the initial 2  cm of the superficial femoral artery with 6 mm diameter by 8 cm length Lutonix drug-coated angioplasty balloon. This was inflated to 10 atm for 1 minute. Completion angiogram showed residual thrombus the proximal portion the bypass graft limb with greater than 50% residual stenosis and only slight improvement in the left common femoral artery and proximal superficial femoral artery with flow-limiting thrombus and stenosis in this area as well. I felt it would be prudent at this point to continue thrombolytic therapy through a catheter overnight in hopes of removing the thrombus in restore patency. I placed a 90 cm total length 20 cm working length thrombolytic catheter that started at the origin of the left limb of the bypass graft and traversed down through the distal bypass graft anastomosis about 3-4 cm into the superficial femoral artery. This was secured in place with a silk suture as was the 21 cm sheath that was essentially at the bifurcation of the bypass graft proximally. A central line was then placed which will be dictated separately. The patient was taken to the recovery room in stable condition having tolerated the procedure well.  Findings:  Aortogram: The renal arteries had good flow with no stenosis in the right renal artery stenosis of less than 50% in the left renal artery. Proximal infrarenal aorta was patent with a aortobifemoral bypass graft plugged into the mid infrarenal aorta. The common trunk was patent and the right limb of the bypass graft was widely patent. The left limb of the aortobifemoral bypass was occluded. There reconstituted flow in the left common femoral artery. Left Lower Extremity:  This demonstrated reasonably normal flow from the superficial femoral artery beyond its initial segment occlusion with a normal popliteal artery and what appeared  to be two-vessel runoff distally although it was somewhat sluggish.   Disposition: Patient was taken  to the recovery room in stable condition having tolerated the procedure well.  Complications: None  Mariah Rogers 10/02/2016 4:08 PM   This note was created with Dragon Medical transcription system. Any errors in dictation are purely unintentional.

## 2016-10-02 NOTE — H&P (Signed)
[facility] History & Physical Update  The patient was interviewed and re-examined.  The patient's previous History and Physical has been reviewed and is unchanged.  There is no change in the plan of care. We plan to proceed with the scheduled procedure.  Leotis Pain, MD  10/02/2016, 12:28 PM

## 2016-10-02 NOTE — Progress Notes (Signed)
eLink Physician-Brief Progress Note Patient Name: RAETTA AGOSTINELLI DOB: [birth year] MRN: [id number]   Date of Service  10/02/2016  HPI/Events of Note  patient with severe PAD-Vasc Surgery patient Aortogram and selective left lower extremity angiogram Catheter directed thrombolytic therapy with 8 mg of TPA delivered Mechanical rheolytic thrombectomy   percutaneous transluminal angioplasty    eICU Interventions  Post op ICU care for TPA per vasc surgery No further recs at this time Patient resting comfortably, VS stable     Intervention Category Evaluation Type: New Patient Evaluation  Rella Egelston 10/02/2016, 6:06 PM

## 2016-10-02 NOTE — Op Note (Signed)
[facility]   PROCEDURE NOTE  PROCEDURE: 1. Right IJ triple lumen central venous catheter placement 2. Right IJ cannulation under ultrasound guidance 3. Fluoroscopic guidance for placement of catheter  PRE-OPERATIVE DIAGNOSIS: Ischemic leg  POST-OPERATIVE DIAGNOSIS: same as above  SURGEON: Leotis Pain, MD  ANESTHESIA:  None  ESTIMATED BLOOD LOSS: minimal  FINDING(S): none  SPECIMEN(S):  none  INDICATIONS:   Mariah Rogers is a 66 y.o. female who presents with need for venous access.  The patient presents for intervention for her LE ischemia and will need thrombolytic therapy, and so we will plan to also do central venous catheter placement.  The patient is aware the risks of central venous catheter placement include but are not limited to: bleeding, infection, central venous injury, pneumothorax, possible venous stenosis, possible malpositioning in the venous system, and possible infections related to long-term catheter presence. The patient was aware of these risks and agreed to proceed.  DESCRIPTION: After written informed consent was obtained from the patient and/or family, the patient remained supine on the procedure bed after her angiogram.  The patient was prepped with chloraprep and draped in the standard fashion for a chest or neck central venous catheter placement.  I anesthesized the neck cannulation site with 1% lidocaine then under ultrasound guidance, the right internal jugular vein was cannulated with the 18 gauge needle and a permanent image was recorded.  A J wire was then placed down in the superior vena cava.  After a skin nick and dilatation, the triple lumen central venous catheter was placed over the wire and the wire was removed. As were already in the procedure room with fluoroscopy, this was used to park the tip of the catheter at the cavoatrial junction and this also confirms catheter placement instead of an Xray. Each port was aspirated and  flushed with sterile normal saline.  The catheter was secured in placed with three interrupted stitches of 3-0 Silk tied to the catheter.  The catheter was dressed with sterile dressing.    COMPLICATIONS: none apparent  CONDITION: stable  Leotis Pain 10/02/2016, 4:04 PM     This note was created with Dragon Medical transcription system. Any errors in dictation are purely unintentional.

## 2016-10-02 NOTE — Progress Notes (Addendum)
ANTICOAGULATION CONSULT NOTE - Initial Consult  Pharmacy Consult for Heparin monitoring and management  Indication: Ischemic Leg/VTE treatment   Allergies  Allergen Reactions  . Codeine Nausea And Vomiting    Patient Measurements: Height: 5\' 3"  (160 cm) Weight: 145 lb 15.1 oz (66.2 kg) IBW/kg (Calculated) : 52.4  Vital Signs: Temp: 97.5 F (36.4 C) (04/18 1800) Temp Source: Axillary (04/18 1800) BP: 154/84 (04/18 1800) Pulse Rate: 59 (04/18 1800)  Labs:  Recent Labs  10/01/16 1414 10/02/16 1748  HGB  --  13.4  HCT  --  39.1  PLT  --  207  CREATININE 0.71  --     Estimated Creatinine Clearance: 63.2 mL/min (by C-G formula based on SCr of 0.71 mg/dL).   Medical History: Past Medical History:  Diagnosis Date  . CAD (coronary artery disease)   . COPD (chronic obstructive pulmonary disease) ([place])   . GERD (gastroesophageal reflux disease)   . Hernia, hiatal   . Hyperlipidemia   . Hypertension   . Peripheral vascular disease ([place])   . Thrombosis    in Lt leg    Assessment: 66 yo Vascular surgery patient with severe PAD. Pharmacy consulted for heparin monitoring and management for ischemic leg. Patient also receiving TPA.   Goal of Therapy:  Heparin level 0.2-0.5 units/ml Monitor platelets by anticoagulation protocol: Yes   Plan:  Heparin infusing at 600units/hr.  Heparin level ordered every 6 hours. Pharmacy will continue to monitor and adjust heparin dose to maintain a heparin level between 0.2-0.5 units/ml.     4/18 22:00 heparin level <0.1. Increase rate to 800 units/hr. Rcheck at next interval.  4/19 04:00 heparin level <0.1 Increase to 1000 units/hr and recheck at next interval.  Pernell Dupre, PharmD, BCPS Clinical Pharmacist 10/02/2016 6:21 PM

## 2016-10-03 ENCOUNTER — Encounter
Admission: AD | Disposition: A | Discharge status: Home / Self Care | Payer: Self-pay | Source: Ambulatory Visit | Attending: Vascular Surgery

## 2016-10-03 DIAGNOSIS — I70512 Atherosclerosis of nonautologous biological bypass graft(s) of the extremities with intermittent claudication, left leg: Secondary | ICD-10-CM

## 2016-10-03 HISTORY — PX: LOWER EXTREMITY ANGIOGRAPHY: CATH118251

## 2016-10-03 LAB — CBC
HEMATOCRIT: 38.2 % (ref 35.0–47.0)
HEMATOCRIT: 37.9 % (ref 35.0–47.0)
HEMATOCRIT: 35.4 % (ref 35.0–47.0)
HEMOGLOBIN: 13.1 g/dL (ref 12.0–16.0)
Hemoglobin: 13 g/dL (ref 12.0–16.0)
Hemoglobin: 12.1 g/dL (ref 12.0–16.0)
MCH: 31.3 pg (ref 26.0–34.0)
MCH: 31.2 pg (ref 26.0–34.0)
MCH: 31.1 pg (ref 26.0–34.0)
MCHC: 34.4 g/dL (ref 32.0–36.0)
MCHC: 34.3 g/dL (ref 32.0–36.0)
MCHC: 34.2 g/dL (ref 32.0–36.0)
MCV: 91 fL (ref 80.0–100.0)
MCV: 90.9 fL (ref 80.0–100.0)
MCV: 91 fL (ref 80.0–100.0)
Platelets: 180 10*3/uL (ref 150–440)
Platelets: 189 10*3/uL (ref 150–440)
Platelets: 167 10*3/uL (ref 150–440)
RBC: 4.19 MIL/uL (ref 3.80–5.20)
RBC: 4.17 MIL/uL (ref 3.80–5.20)
RBC: 3.88 MIL/uL (ref 3.80–5.20)
RDW: 14 % (ref 11.5–14.5)
RDW: 14.6 % — ABNORMAL HIGH (ref 11.5–14.5)
RDW: 14.3 % (ref 11.5–14.5)
WBC: 13 10*3/uL — AB (ref 3.6–11.0)
WBC: 14 10*3/uL — AB (ref 3.6–11.0)
WBC: 14.7 10*3/uL — AB (ref 3.6–11.0)

## 2016-10-03 LAB — BASIC METABOLIC PANEL
Anion gap: 7 (ref 5–15)
Anion gap: 6 (ref 5–15)
BUN: 23 mg/dL — AB (ref 6–20)
BUN: 14 mg/dL (ref 6–20)
CALCIUM: 9 mg/dL (ref 8.9–10.3)
CHLORIDE: 111 mmol/L (ref 101–111)
CHLORIDE: 109 mmol/L (ref 101–111)
CO2: 22 mmol/L (ref 22–32)
CO2: 24 mmol/L (ref 22–32)
CREATININE: 0.87 mg/dL (ref 0.44–1.00)
CREATININE: 0.78 mg/dL (ref 0.44–1.00)
Calcium: 8.5 mg/dL — ABNORMAL LOW (ref 8.9–10.3)
GFR calc Af Amer: >60 mL/min (ref >60)
GFR calc Af Amer: >60 mL/min (ref >60)
GFR calc non Af Amer: >60 mL/min (ref >60)
GLUCOSE: 191 mg/dL — AB (ref 65–99)
GLUCOSE: 142 mg/dL — AB (ref 65–99)
Potassium: 4 mmol/L (ref 3.5–5.1)
Potassium: 3.2 mmol/L — ABNORMAL LOW (ref 3.5–5.1)
SODIUM: 139 mmol/L (ref 135–145)
Sodium: 140 mmol/L (ref 135–145)

## 2016-10-03 LAB — HEPARIN LEVEL (UNFRACTIONATED)
Heparin Unfractionated: <0.1 IU/mL — ABNORMAL LOW (ref 0.30–0.70)
Heparin Unfractionated: 0.17 IU/mL — ABNORMAL LOW (ref 0.30–0.70)
Heparin Unfractionated: <0.1 IU/mL — ABNORMAL LOW (ref 0.30–0.70)

## 2016-10-03 LAB — FIBRINOGEN
Fibrinogen: 158 mg/dL — ABNORMAL LOW (ref 210–475)
Fibrinogen: 157 mg/dL — ABNORMAL LOW (ref 210–475)

## 2016-10-03 SURGERY — LOWER EXTREMITY ANGIOGRAPHY
Anesthesia: Moderate Sedation

## 2016-10-03 MED ORDER — FENTANYL CITRATE (PF) 100 MCG/2ML IJ SOLN
INTRAMUSCULAR | Status: DC | PRN
  Administered 2016-10-03: 25 ug via INTRAVENOUS
  Administered 2016-10-03: 50 ug via INTRAVENOUS

## 2016-10-03 MED ORDER — CEFAZOLIN IN D5W 1 GM/50ML IV SOLN
1.0000 g | Freq: Once | INTRAVENOUS | Status: AC
  Administered 2016-10-03: 1 g via INTRAVENOUS

## 2016-10-03 MED ORDER — LIDOCAINE-EPINEPHRINE (PF) 2 %-1:200000 IJ SOLN
INTRAMUSCULAR | Status: AC
  Filled 2016-10-03: qty 20

## 2016-10-03 MED ORDER — MIDAZOLAM HCL 5 MG/5ML IJ SOLN
INTRAMUSCULAR | Status: AC
Start: 2016-10-03 — End: 2016-10-03
  Filled 2016-10-03: qty 5

## 2016-10-03 MED ORDER — FENTANYL CITRATE (PF) 100 MCG/2ML IJ SOLN
INTRAMUSCULAR | Status: AC
  Filled 2016-10-03: qty 2

## 2016-10-03 MED ORDER — IOPAMIDOL (ISOVUE-300) INJECTION 61%
INTRAVENOUS | Status: DC | PRN
  Administered 2016-10-03: 20 mL via INTRA_ARTERIAL

## 2016-10-03 MED ORDER — HEPARIN SODIUM (PORCINE) 1000 UNIT/ML IJ SOLN
INTRAMUSCULAR | Status: AC
  Filled 2016-10-03: qty 1

## 2016-10-03 MED ORDER — POTASSIUM CHLORIDE CRYS ER 20 MEQ PO TBCR
40.0000 meq | EXTENDED_RELEASE_TABLET | Freq: Once | ORAL | Status: AC
  Administered 2016-10-04: 40 meq via ORAL
  Filled 2016-10-03: qty 2

## 2016-10-03 MED ORDER — ONDANSETRON HCL 4 MG/2ML IJ SOLN
INTRAMUSCULAR | Status: AC
  Filled 2016-10-03: qty 2

## 2016-10-03 MED ORDER — APIXABAN 5 MG PO TABS
5.0000 mg | ORAL_TABLET | Freq: Two times a day (BID) | ORAL | Status: DC
  Administered 2016-10-04: 5 mg via ORAL
  Filled 2016-10-03: qty 1

## 2016-10-03 MED ORDER — HEPARIN SODIUM (PORCINE) 1000 UNIT/ML IJ SOLN
INTRAMUSCULAR | Status: DC | PRN
  Administered 2016-10-03: 3000 [IU] via INTRAVENOUS

## 2016-10-03 MED ORDER — MIDAZOLAM HCL 2 MG/2ML IJ SOLN
INTRAMUSCULAR | Status: DC | PRN
  Administered 2016-10-03: 2 mg via INTRAVENOUS
  Administered 2016-10-03: 1 mg via INTRAVENOUS

## 2016-10-03 SURGICAL SUPPLY — 6 items
BALLN LUTONIX DCB 6X60X130 (BALLOONS) ×2
BALLOON LUTONIX DCB 6X60X130 (BALLOONS) ×1 IMPLANT
DEVICE PRESTO INFLATION (MISCELLANEOUS) ×2 IMPLANT
DEVICE STARCLOSE SE CLOSURE (Vascular Products) ×2 IMPLANT
PACK ANGIOGRAPHY (CUSTOM PROCEDURE TRAY) ×2 IMPLANT
WIRE MAGIC TORQUE 260C (WIRE) ×2 IMPLANT

## 2016-10-03 NOTE — Progress Notes (Signed)
Inpatient Diabetes Program Recommendations  AACE/ADA: New Consensus Statement on Inpatient Glycemic Control (2015)  Target Ranges:  Prepandial:   less than 140 mg/dL      Peak postprandial:   less than 180 mg/dL (1-2 hours)      Critically ill patients:  140 - 180 mg/dL   Lab Results  Component Value Date   GLUCAP 222 (H) 10/02/2016   HGBA1C 6.6 09/28/2014    Review of Glycemic Control Results for Mariah Rogers, Mariah Rogers (MRN [id number]) as of 10/03/2016 09:42  Ref. Range 10/02/2016 17:51  Glucose-Capillary Latest Ref Range: 65 - 99 mg/dL 222 (H)   Diabetes history: none noted- A1C pending Outpatient Diabetes medications: none Current orders for Inpatient glycemic control: none  Inpatient Diabetes Program Recommendations:   Consider adding Novolog 0-9 units tid, Novolog 0-5 units qhs  Gentry Fitz, RN, [place], [place], CDE Diabetes Coordinator Inpatient Diabetes Program  [phone] (Team Pager) [phone] ([place]) 10/03/2016 9:44 AM

## 2016-10-03 NOTE — Op Note (Signed)
[facility] Percutaneous Study/Intervention Procedural Note   Date of Surgery:  10/03/2016  Surgeon(s):Donald Jacque   Assistants:none  Pre-operative Diagnosis: PAD with claudication left lower extremity  Post-operative diagnosis: Same  Procedure(s) Performed: 1. Catheter placement into left SFA from right femoral approach 2. Aortogram and selective left lower extremity angiogram 3. Percutaneous transluminal angioplasty of left common femoral artery and distal bypass anastomosis with 6 mm diameter by 6 cm length Lutonix drug-coated angioplasty balloon 4. 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 The patient has noninvasive study showing suspected occlusion of the bypass graft and she was brought down yesterday where that was found to be the case. She has been running on thrombolytic therapy overnight and returns for second look angiography. The patient is brought in for angiography for further evaluation and potential treatment. Risks and benefits are discussed and informed consent is obtained  Procedure: The patient was identified and appropriate procedural time out was performed. The patient was then placed supine on the table and prepped and draped in the usual sterile fashion.Moderate conscious sedation was administered during a face to face encounter with the patient throughout the procedure with my supervision of the RN administering medicines and monitoring the patient's vital signs, pulse oximetry, telemetry and mental status throughout from the start of the procedure until the patient was taken  to the recovery room. The existing thrombolytic catheter was removed over a Magic torque wire and a Kumpe catheter placed in the SFA. Selective left lower extremity angiogram was then performed. This demonstrated normal and patent superficial femoral artery, popliteal artery, and two-vessel runoff distally through the peroneal artery and anterior tibial arteries. The Magic torque wire was replaced and imaging was performed through the 6 French sheath that was parked at the bifurcation of the bypass graft. This demonstrated resolution of the thrombus and narrowing in the proximal portion of the left limb of the aortobifemoral bypass graft which was now widely patent. The right limb of the bypass graft remained widely patent. The aorta proximal to the bypass was patent. There was what appeared to be a hyperplastic narrowing at the left distal bypass anastomosis that was in the moderate range and somewhere around 60%. This was a marked improvement and there was not the large blob of thrombus that was nearly occlusive yesterday. I elected to treat this area with a 6 mm diameter by 6 cm length Lutonix drug-coated angioplasty balloon after giving the patient additional 3000 units of intravenous heparin. This was inflated to 12 atm for 1 minute. Completion angiogram showed about a 30-40% residual stenosis that did not appear flow limiting and no thrombus was present. This would be a poor location for stent placement, and there was brisk flow through there so I felt leaving this alone would be the best option. I elected to terminate the procedure. The sheath was removed and StarClose closure device was deployed in the right femoral artery with excellent hemostatic result. The patient was taken to the recovery room in stable condition having tolerated the procedure well.  Findings:  Aortogram: Resolution of the thrombus and narrowing in the proximal portion of the left limb of the aortobifemoral bypass graft  which was now  widely patent. The right limb of the bypass graft remained widely patent. The aorta proximal to the bypass was patent. Left Lower Extremity: This demonstrated normal and patent superficial femoral artery, popliteal artery, and two-vessel runoff distally through the peroneal artery and anterior tibial arteries. There was what appeared to be a hyperplastic narrowing at the left distal bypass anastomosis that was in the moderate range and somewhere around 60%. This was a marked improvement and there was not the large blob of thrombus that was nearly occlusive yesterday.   Disposition: Patient was taken to the recovery room in stable condition having tolerated the procedure well.  Complications: None  Mariah Rogers 10/03/2016 3:08 PM   This note was created with Dragon Medical transcription system. Any errors in dictation are purely unintentional.

## 2016-10-03 NOTE — Progress Notes (Signed)
Patient was transported to Special Procedures holding area. Report given to Victoria Vera, Therapist, sports. Patient's daughter, Marzetta Board, was called and informed that her mother had been taken down to [facility] for there procedure.

## 2016-10-03 NOTE — Progress Notes (Signed)
Patient vital signs are stable and at baseline. Patient is tolerating ginger ale and jello with no nausea or vomiting. Patient right femoral vascular access is without ecchymosis, skin is supple surrounding site. No drainage visible on dressing, distal dorsal pedal pulses bilaterally are palpated and warm to touch. Patient is alert and oriented, no complaints of pain. Per report from day shift nurse once patient VSS, PO intake without N&V, and pain under control, patient may transfer off unit with cardiac monitoring.  Orders placed to transfer.   Observation of gradual increasing T wave in ECG rhythm is noted, confirmed with CCMD. Patient skin is dry, no complaints of chest pain or any pain, no complaints of nausea. Evaluated potassium level with serum lab work. Potassium level of 3.2 resulted. Notified vascular surgery MD of potassium level, received verbal order for pharmacy consult of electrolyte management and lab recheck for CBC and BMP in morning.   Will continue to monitor and assess patient for any changes and will await bed approval for transfer.

## 2016-10-03 NOTE — Progress Notes (Signed)
This patient is currently still asleep, very drowsy when attempt to arouse is made.

## 2016-10-03 NOTE — Progress Notes (Signed)
MEDICATION RELATED CONSULT NOTE - INITIAL   Pharmacy Consult for electrolytes Indication:   Allergies  Allergen Reactions  . Codeine Nausea And Vomiting    Patient Measurements: Height: 5\' 3"  (160 cm) Weight: 144 lb 10 oz (65.6 kg) IBW/kg (Calculated) : 52.4 Adjusted Body Weight:   Vital Signs: Temp: 99.3 F (37.4 C) (04/19 2000) Temp Source: Oral (04/19 2000) BP: 128/69 (04/19 2300) Pulse Rate: 71 (04/19 2300) Intake/Output from previous day: 04/18 0701 - 04/19 0700 In: 1587.3 [I.V.:1462.3] Out: 1220 [Urine:1020; Emesis/NG output:200] Intake/Output from this shift: Total I/O In: 300 [I.V.:300] Out: 210 [Urine:210]  Labs:  Recent Labs  10/01/16 1414  10/03/16 0331 10/03/16 0955 10/03/16 1835 10/03/16 2245  WBC  --   < > 13.0* 14.0* 14.7*  --   HGB  --   < > 13.1 13.0 12.1  --   HCT  --   < > 38.2 37.9 35.4  --   PLT  --   < > 180 189 167  --   CREATININE 0.71  --  0.87  --   --  0.78  < > = values in this interval not displayed. Estimated Creatinine Clearance: 63 mL/min (by C-G formula based on SCr of 0.78 mg/dL).   Microbiology: Recent Results (from the past 720 hour(s))  MRSA PCR Screening     Status: None   Collection Time: 10/02/16  5:30 PM  Result Value Ref Range Status   MRSA by PCR NEGATIVE NEGATIVE Final    Comment:        The GeneXpert MRSA Assay (FDA approved for NASAL specimens only), is one component of a comprehensive MRSA colonization surveillance program. It is not intended to diagnose MRSA infection nor to guide or monitor treatment for MRSA infections.     Medical History: Past Medical History:  Diagnosis Date  . CAD (coronary artery disease)   . COPD (chronic obstructive pulmonary disease) ([place])   . GERD (gastroesophageal reflux disease)   . Hernia, hiatal   . Hyperlipidemia   . Hypertension   . Peripheral vascular disease ([place])   . Thrombosis    in Lt leg    Medications:    Assessment: K+ 3.2.  Goal of Therapy:   K+ WNL.  Plan:  KCl 40 mEq PO x1 ordered. BMP tomorrow AM.  Waneta Martins S 10/03/2016,11:53 PM

## 2016-10-03 NOTE — Progress Notes (Signed)
This Probation officer spoke w/ Dr. Lucky Cowboy. This patient can go to any off-unit telemetry bed after she wakes up and is evaluated to be stable (VS, pain control, etc.) and pass PO trial (N/V under control).

## 2016-10-03 NOTE — Progress Notes (Signed)
ANTICOAGULATION CONSULT NOTE - Initial Consult  Pharmacy Consult for Heparin monitoring and management  Indication: Ischemic Leg/VTE treatment   Allergies  Allergen Reactions  . Codeine Nausea And Vomiting    Patient Measurements: Height: 5\' 3"  (160 cm) Weight: 144 lb 10 oz (65.6 kg) IBW/kg (Calculated) : 52.4  Vital Signs: Temp: 98.2 F (36.8 C) (04/19 1606) Temp Source: Oral (04/19 1606) BP: 125/60 (04/19 1900) Pulse Rate: 63 (04/19 1900)  Labs:  Recent Labs  10/01/16 1414  10/03/16 0331 10/03/16 0955 10/03/16 1835  HGB  --   < > 13.1 13.0 12.1  HCT  --   < > 38.2 37.9 35.4  PLT  --   < > 180 189 167  HEPARINUNFRC  --   < > <0.10* 0.17* <0.10*  CREATININE 0.71  --  0.87  --   --   < > = values in this interval not displayed.  Estimated Creatinine Clearance: 57.9 mL/min (by C-G formula based on SCr of 0.87 mg/dL).   Medical History: Past Medical History:  Diagnosis Date  . CAD (coronary artery disease)   . COPD (chronic obstructive pulmonary disease) ([place])   . GERD (gastroesophageal reflux disease)   . Hernia, hiatal   . Hyperlipidemia   . Hypertension   . Peripheral vascular disease ([place])   . Thrombosis    in Lt leg    Assessment: 66 yo Vascular surgery patient with severe PAD. Pharmacy consulted for heparin monitoring and management for ischemic leg. Patient also receiving TPA.   Goal of Therapy:  Heparin level 0.2-0.5 units/ml Monitor platelets by anticoagulation protocol: Yes   Plan:  Patient is s/p angiogram with orders for apixaban to begin tomorrow AM. Orders for heparin drip and heparin consult remain active. Heparin infusion was stopped prior to procedure and remains stopped after patient's return to the ICU. Attempted to call both vascular surgeons to clarify orders and determine whether or not to resume heparin until AM. Neither physician returned page.   Napoleon Form, PharmD, BCPS Clinical Pharmacist 10/03/2016 7:50 PM

## 2016-10-03 NOTE — H&P (Signed)
[facility] History & Physical Update  The patient was interviewed and re-examined.  The patient's previous History and Physical has been reviewed and is unchanged.  There is no change in the plan of care. We plan to proceed with the scheduled procedure.  Leotis Pain, MD  10/03/2016, 8:12 AM

## 2016-10-03 NOTE — Progress Notes (Addendum)
Report received from [place] in Special Procedures. Pt's Heparin & TPA gtts stopped at 1430. Patient received 1 gram of Ancef, 2,000 units Heparin, left SFA ballooned, StarClose closure device to right groin. Site is Level 0. May sit up at 1635 hours.  I notified patient's son and daughter of her status via telephone.

## 2016-10-03 NOTE — Progress Notes (Signed)
Patient has returned to unit from Mariah Rogers. She is A&O x 4, in NSR, skin warm and dry, denies pain and nausea. Advised her that she could sit up at 1635 hours and have something to drink. She was educated on, and verbalized her understanding of, holding pressure on her right groin when coughing. Call bell within patient's reach.

## 2016-10-04 ENCOUNTER — Encounter: Payer: Self-pay | Admitting: Vascular Surgery

## 2016-10-04 DIAGNOSIS — I70512 Atherosclerosis of nonautologous biological bypass graft(s) of the extremities with intermittent claudication, left leg: Secondary | ICD-10-CM

## 2016-10-04 LAB — BASIC METABOLIC PANEL
Anion gap: 4 — ABNORMAL LOW (ref 5–15)
BUN: 11 mg/dL (ref 6–20)
CO2: 24 mmol/L (ref 22–32)
Calcium: 8.5 mg/dL — ABNORMAL LOW (ref 8.9–10.3)
Chloride: 111 mmol/L (ref 101–111)
Creatinine, Ser: 0.7 mg/dL (ref 0.44–1.00)
GFR calc Af Amer: >60 mL/min (ref >60)
GFR calc non Af Amer: >60 mL/min (ref >60)
GLUCOSE: 131 mg/dL — AB (ref 65–99)
POTASSIUM: 4 mmol/L (ref 3.5–5.1)
Sodium: 139 mmol/L (ref 135–145)

## 2016-10-04 LAB — CBC
HCT: 33 % — ABNORMAL LOW (ref 35.0–47.0)
Hemoglobin: 11.4 g/dL — ABNORMAL LOW (ref 12.0–16.0)
MCH: 31.4 pg (ref 26.0–34.0)
MCHC: 34.6 g/dL (ref 32.0–36.0)
MCV: 90.6 fL (ref 80.0–100.0)
Platelets: 148 10*3/uL — ABNORMAL LOW (ref 150–440)
RBC: 3.64 MIL/uL — AB (ref 3.80–5.20)
RDW: 14.2 % (ref 11.5–14.5)
WBC: 10.6 10*3/uL (ref 3.6–11.0)

## 2016-10-04 LAB — GLUCOSE, CAPILLARY: Glucose-Capillary: 114 mg/dL — ABNORMAL HIGH (ref 65–99)

## 2016-10-04 MED ORDER — HYDROCODONE-ACETAMINOPHEN 5-325 MG PO TABS: 1.0000 | ORAL_TABLET | Freq: Four times a day (QID) | ORAL | 0 refills | Status: DC | PRN

## 2016-10-04 MED ORDER — APIXABAN 5 MG PO TABS: 5.0000 mg | ORAL_TABLET | Freq: Two times a day (BID) | ORAL | 5 refills | Status: DC

## 2016-10-04 MED ORDER — ASPIRIN 81 MG PO TBEC: 81.0000 mg | DELAYED_RELEASE_TABLET | Freq: Every day | ORAL | 5 refills | Status: DC

## 2016-10-04 NOTE — Discharge Summary (Signed)
[facility]    Discharge Summary   Patient ID:  Mariah Rogers MRN: [id number] DOB/AGE: August 17, 1950 66 y.o.  Admit date: 10/02/2016 Discharge date: 10/04/2016 Date of Surgery: 10/02/2016 - 10/03/2016 Surgeon: Juliann Mule): Algernon Huxley, MD  Admission Diagnosis: Ischemic leg [I99.8]  Discharge Diagnoses:  Ischemic leg [I99.8]  Secondary Diagnoses: Past Medical History:  Diagnosis Date  . CAD (coronary artery disease)   . COPD (chronic obstructive pulmonary disease) ([place])   . GERD (gastroesophageal reflux disease)   . Hernia, hiatal   . Hyperlipidemia   . Hypertension   . Peripheral vascular disease ([place])   . 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 On 09/23/16, the patient underwent a catheter placement into left SFA from right femoral approach, aortogram and selective left lower extremity angiogram, percutaneous transluminal angioplasty of left common femoral artery and distal bypass anastomosis with 6 mm diameter by 6 cm length Lutonix drug-coated angioplasty balloon with starClose closure device right femoral artery. At the end of the procedure, she was found to have a patent aorta with two vessel run off. She tolerated the procedure well and was transferred to the ICU then surgical floor without issue. Foley and central line removed without complication. Eating and ambulating well.   Hospital Course:  Mariah Rogers is a 66 y.o. female is S/P  Procedure(s): Lower Extremity Angiography with possible revascularization  Extubated: POD # 0   Physical exam:  A&Ox3, NAD CV: RRR Pulm: CTA Bilaterally Abdomen: Soft, Non-tender, (+) Bowel Sounds Extremity:   Left Lower Extremity: PT pulse palpable, foot warm, no edema  Post-op wounds  clean, dry, intact or healing well  Pt. Ambulating, voiding and taking PO diet without difficulty. Pt pain controlled with PO pain meds. Labs as below  Complications:none  Consults: None  Significant Diagnostic Studies: CBC Lab Results  Component Value Date   WBC 10.6 10/04/2016   HGB 11.4 (L) 10/04/2016   HCT 33.0 (L) 10/04/2016   MCV 90.6 10/04/2016   PLT 148 (L) 10/04/2016   BMET    Component Value Date/Time   NA 139 10/04/2016 0500   NA 142 03/22/2015   K 4.0 10/04/2016 0500   CL 111 10/04/2016 0500   CO2 24 10/04/2016 0500   GLUCOSE 131 (H) 10/04/2016 0500   BUN 11 10/04/2016 0500   BUN 13 03/22/2015   CREATININE 0.70 10/04/2016 0500   CALCIUM 8.5 (L) 10/04/2016 0500   GFRNONAA >60 10/04/2016 0500   GFRAA >60 10/04/2016 0500   COAG No results found for: INR, PROTIME  Disposition:  Discharge to :Home  Allergies as of 10/04/2016      Reactions   Codeine Nausea And Vomiting      Medication List    TAKE these medications   apixaban 5 MG Tabs tablet Commonly known as:  ELIQUIS Take 1 tablet (5 mg total) by mouth 2 (two) times daily.   aspirin 81 MG EC tablet Take 1 tablet (81 mg total) by mouth daily. Start taking on:  10/05/2016   cholecalciferol 1000 units tablet Commonly known as:  VITAMIN D Take 1,000 Units by mouth daily.   ezetimibe-simvastatin 10-40 MG tablet Commonly known as:  VYTORIN Take 1 tablet by mouth daily.   HYDROcodone-acetaminophen 5-325 MG tablet Commonly  known as:  NORCO/VICODIN Take 1-2 tablets by mouth every 6 (six) hours as needed for moderate pain or severe pain.   lisinopril 20 MG tablet Commonly known as:  PRINIVIL,ZESTRIL Take 20 mg by mouth daily.   lisinopril-hydrochlorothiazide 20-12.5 MG tablet Commonly known as:  PRINZIDE,ZESTORETIC Take 1 tablet by mouth daily.   omeprazole 20 MG capsule Commonly known as:  PRILOSEC Take 20 mg by mouth daily.   sertraline 50 MG tablet Commonly known as:  ZOLOFT Take 50  mg by mouth at bedtime.      Verbal and written Discharge instructions given to the patient. Wound care per Discharge AVS Follow-up Information    Leotis Pain, MD Follow up in 1 month(s).   Specialties:  Vascular Surgery, Radiology, Interventional Cardiology Why:  Thrombolysis in hospital. Needs ABI in one month. Bjorn Pippin) Contact information: [place] [address] [phone]          Signed: Sela Hua, PA-C  10/04/2016, 10:37 AM

## 2016-10-04 NOTE — Discharge Instructions (Signed)
You make shower. Removed groin dressing tomorrow.

## 2016-10-04 NOTE — Care Management (Signed)
Patient to discharge on eliquis.  30 day free trial coupon provided.

## 2016-11-08 ENCOUNTER — Ambulatory Visit (INDEPENDENT_AMBULATORY_CARE_PROVIDER_SITE_OTHER): Payer: Medicare Other | Admitting: Vascular Surgery

## 2016-11-08 ENCOUNTER — Encounter (INDEPENDENT_AMBULATORY_CARE_PROVIDER_SITE_OTHER): Payer: Medicare Other

## 2016-11-18 ENCOUNTER — Other Ambulatory Visit (INDEPENDENT_AMBULATORY_CARE_PROVIDER_SITE_OTHER): Payer: Self-pay | Admitting: Vascular Surgery

## 2016-11-18 DIAGNOSIS — I70219 Atherosclerosis of native arteries of extremities with intermittent claudication, unspecified extremity: Secondary | ICD-10-CM

## 2016-11-19 ENCOUNTER — Ambulatory Visit (INDEPENDENT_AMBULATORY_CARE_PROVIDER_SITE_OTHER): Payer: Medicare Other | Admitting: Vascular Surgery

## 2016-11-19 ENCOUNTER — Encounter (INDEPENDENT_AMBULATORY_CARE_PROVIDER_SITE_OTHER): Payer: Self-pay | Admitting: Vascular Surgery

## 2016-11-19 ENCOUNTER — Ambulatory Visit (INDEPENDENT_AMBULATORY_CARE_PROVIDER_SITE_OTHER): Payer: Medicare Other

## 2016-11-19 VITALS — BP 134/66 | HR 57 | Resp 16 | Wt 157.0 lb

## 2016-11-19 DIAGNOSIS — F17210 Nicotine dependence, cigarettes, uncomplicated: Secondary | ICD-10-CM | POA: Diagnosis not present

## 2016-11-19 DIAGNOSIS — I739 Peripheral vascular disease, unspecified: Secondary | ICD-10-CM | POA: Diagnosis not present

## 2016-11-19 DIAGNOSIS — Z720 Tobacco use: Secondary | ICD-10-CM | POA: Diagnosis not present

## 2016-11-19 DIAGNOSIS — I10 Essential (primary) hypertension: Secondary | ICD-10-CM

## 2016-11-19 DIAGNOSIS — I70219 Atherosclerosis of native arteries of extremities with intermittent claudication, unspecified extremity: Secondary | ICD-10-CM

## 2016-11-19 NOTE — Assessment & Plan Note (Signed)
Her noninvasive studies today demonstrate normal ABIs of 1.1 on the right and 1.08 on the left with good brisk waveforms. She will continue her full anticoagulation as she now has a disadvantage graft as well as her aspirin. I will plan to see her back in 3 months with both duplex and ABIs. Smoking cessation strongly recommended.

## 2016-11-19 NOTE — Progress Notes (Signed)
MRN : [id number]  Mariah Rogers is a 65 y.o. (12-13-1950) female who presents with chief complaint of  Chief Complaint  Patient presents with  . Follow-up  .  History of Present Illness: Patient returns today in follow up of PAD. About 6 weeks ago, she underwent extensive treatment to her left leg for limb salvage with an occluded bypass graft. We were able to get her bypass graft open with percutaneous management and anticoagulation. She remains on anticoagulation. Her leg looks well today and she has no complaints. She has a little bit of a scar from her access site but otherwise is doing well. Her noninvasive studies today demonstrate normal ABIs of 1.1 on the right and 1.08 on the left with good brisk waveforms.  Current Outpatient Prescriptions  Medication Sig Dispense Refill  . apixaban (ELIQUIS) 5 MG TABS tablet Take 1 tablet (5 mg total) by mouth 2 (two) times daily. 60 tablet 5  . aspirin EC 81 MG EC tablet Take 1 tablet (81 mg total) by mouth daily. 30 tablet 5  . cholecalciferol (VITAMIN D) 1000 units tablet Take 1,000 Units by mouth daily.    Marland Kitchen ezetimibe-simvastatin (VYTORIN) 10-40 MG tablet Take 1 tablet by mouth daily.    Marland Kitchen lisinopril-hydrochlorothiazide (PRINZIDE,ZESTORETIC) 20-12.5 MG tablet Take 1 tablet by mouth daily.    Marland Kitchen omeprazole (PRILOSEC) 20 MG capsule Take 20 mg by mouth daily.    . sertraline (ZOLOFT) 50 MG tablet Take 50 mg by mouth at bedtime.    Marland Kitchen HYDROcodone-acetaminophen (NORCO/VICODIN) 5-325 MG tablet Take 1-2 tablets by mouth every 6 (six) hours as needed for moderate pain or severe pain. (Patient not taking: Reported on 11/19/2016) 30 tablet 0  . lisinopril (PRINIVIL,ZESTRIL) 20 MG tablet Take 20 mg by mouth daily.     No current facility-administered medications for this visit.     Past Medical History:  Diagnosis Date  . CAD (coronary artery disease)   . COPD (chronic obstructive pulmonary disease) ([place])   . GERD (gastroesophageal reflux disease)     . Hernia, hiatal   . Hyperlipidemia   . Hypertension   . Peripheral vascular disease ([place])   . Thrombosis    in Lt leg    Past Surgical History:  Procedure Laterality Date  . ABDOMINAL AORTA STENT    . ABDOMINAL HYSTERECTOMY    . aortobifemoral bypass  2005  . CESAREAN SECTION     x 3  . femoral stents    . LOWER EXTREMITY ANGIOGRAPHY Left 10/02/2016   Procedure: Lower Extremity Angiography;  Surgeon: Algernon Huxley, MD;  Location: [facility];  Service: Cardiovascular;  Laterality: Left;  . LOWER EXTREMITY ANGIOGRAPHY N/A 10/03/2016   Procedure: Lower Extremity Angiography with possible revascularization;  Surgeon: Algernon Huxley, MD;  Location: [facility];  Service: Cardiovascular;  Laterality: N/A;    Social History Social History  Substance Use Topics  . Smoking status: Current Every Day Smoker    Packs/day: 0.50    Years: 60.00    Types: Cigarettes  . Smokeless tobacco: Never Used  . Alcohol use No    Family History Family History  Problem Relation Age of Onset  . Osteoporosis Mother   . Alzheimer's disease Mother     Allergies  Allergen Reactions  . Codeine Nausea And Vomiting     REVIEW OF SYSTEMS (Negative unless checked)  Constitutional: '[]'$ Weight loss  '[]'$ Fever  '[]'$ Chills Cardiac: '[]'$ Chest pain   '[]'$ Chest pressure   '[]'$   Palpitations   '[]'$ Shortness of breath when laying flat   '[]'$ Shortness of breath at rest   '[x]'$ Shortness of breath with exertion. Vascular:  '[]'$ Pain in legs with walking   '[]'$ Pain in legs at rest   '[]'$ Pain in legs when laying flat   '[]'$ Claudication   '[]'$ Pain in feet when walking  '[]'$ Pain in feet at rest  '[]'$ Pain in feet when laying flat   '[]'$ History of DVT   '[]'$ Phlebitis   '[]'$ Swelling in legs   '[]'$ Varicose veins   '[]'$ Non-healing ulcers Pulmonary:   '[]'$ Uses home oxygen   '[]'$ Productive cough   '[]'$ Hemoptysis   '[]'$ Wheeze  '[x]'$ COPD   '[]'$ Asthma Neurologic:  '[]'$ Dizziness  '[]'$ Blackouts   '[]'$ Seizures   '[]'$ History of stroke   '[]'$ History of TIA  '[]'$ Aphasia   '[]'$ Temporary  blindness   '[]'$ Dysphagia   '[]'$ Weakness or numbness in arms   '[]'$ Weakness or numbness in legs Musculoskeletal:  '[x]'$ Arthritis   '[]'$ Joint swelling   '[]'$ Joint pain   '[]'$ Low back pain Hematologic:  '[]'$ Easy bruising  '[]'$ Easy bleeding   '[]'$ Hypercoagulable state   '[]'$ Anemic   Gastrointestinal:  '[]'$ Blood in stool   '[]'$ Vomiting blood  '[]'$ Gastroesophageal reflux/heartburn   '[]'$ Abdominal pain Genitourinary:  '[]'$ Chronic kidney disease   '[]'$ Difficult urination  '[]'$ Frequent urination  '[]'$ Burning with urination   '[]'$ Hematuria Skin:  '[]'$ Rashes   '[]'$ Ulcers   '[]'$ Wounds Psychological:  '[]'$ History of anxiety   '[]'$  History of major depression.  Physical Examination  BP 134/66   Pulse (!) 57   Resp 16   Wt 71.2 kg (157 lb)   BMI 27.81 kg/m  Gen:  WD/WN, NAD Head: [place]/AT, No temporalis wasting. Ear/Nose/Throat: Hearing grossly intact, nares w/o erythema or drainage, trachea midline Eyes: Conjunctiva clear. Sclera non-icteric Neck: Supple.  No JVD.  Pulmonary:  Good air movement, no use of accessory muscles.  Cardiac: RRR, normal S1, S2 Vascular:  Vessel Right Left  Radial Palpable Palpable  Ulnar Palpable Palpable  Brachial Palpable Palpable  Carotid Palpable, without bruit Palpable, without bruit  Aorta Not palpable N/A  Femoral Palpable Palpable  Popliteal Palpable Palpable  PT Palpable Palpable  DP Palpable Palpable   Gastrointestinal: soft, non-tender/non-distended.  Musculoskeletal: M/S 5/5 throughout.  No deformity or atrophy.  Neurologic: Sensation grossly intact in extremities.  Symmetrical.  Speech is fluent.  Psychiatric: Judgment intact, Mood & affect appropriate for pt's clinical situation. Dermatologic: No rashes or ulcers noted.  No cellulitis or open wounds.       Labs Recent Results (from the past 2160 hour(s))  BUN     Status: Abnormal   Collection Time: 10/01/16  2:14 PM  Result Value Ref Range   BUN 21 (H) 6 - 20 mg/dL  Creatinine, serum     Status: None   Collection Time: 10/01/16  2:14 PM    Result Value Ref Range   Creatinine, Ser 0.71 0.44 - 1.00 mg/dL   GFR calc non Af Amer >60 >60 mL/min   GFR calc Af Amer >60 >60 mL/min    Comment: (NOTE) The eGFR has been calculated using the CKD EPI equation. This calculation has not been validated in all clinical situations. eGFR's persistently <60 mL/min signify possible Chronic Kidney Disease.   MRSA PCR Screening     Status: None   Collection Time: 10/02/16  5:30 PM  Result Value Ref Range   MRSA by PCR NEGATIVE NEGATIVE    Comment:        The GeneXpert MRSA Assay (FDA approved for NASAL specimens only), is one component of a  comprehensive MRSA colonization surveillance program. It is not intended to diagnose MRSA infection nor to guide or monitor treatment for MRSA infections.   CBC     Status: Abnormal   Collection Time: 10/02/16  5:48 PM  Result Value Ref Range   WBC 17.9 (H) 3.6 - 11.0 K/uL   RBC 4.31 3.80 - 5.20 MIL/uL   Hemoglobin 13.4 12.0 - 16.0 g/dL   HCT 39.1 35.0 - 47.0 %   MCV 90.7 80.0 - 100.0 fL   MCH 31.1 26.0 - 34.0 pg   MCHC 34.3 32.0 - 36.0 g/dL   RDW 14.2 11.5 - 14.5 %   Platelets 207 150 - 440 K/uL  Glucose, capillary     Status: Abnormal   Collection Time: 10/02/16  5:51 PM  Result Value Ref Range   Glucose-Capillary 222 (H) 65 - 99 mg/dL  Fibrinogen     Status: None   Collection Time: 10/02/16  6:19 PM  Result Value Ref Range   Fibrinogen 222 210 - 475 mg/dL    Comment: SLIGHT HEMOLYSIS  Heparin level (unfractionated)     Status: Abnormal   Collection Time: 10/02/16  6:19 PM  Result Value Ref Range   Heparin Unfractionated <0.10 (L) 0.30 - 0.70 IU/mL    Comment:        IF HEPARIN RESULTS ARE BELOW EXPECTED VALUES, AND PATIENT DOSAGE HAS BEEN CONFIRMED, SUGGEST FOLLOW UP TESTING OF ANTITHROMBIN III LEVELS. RESULT REPEATED AND VERIFIED   CBC     Status: Abnormal   Collection Time: 10/02/16  9:48 PM  Result Value Ref Range   WBC 16.5 (H) 3.6 - 11.0 K/uL   RBC 4.30 3.80 - 5.20  MIL/uL   Hemoglobin 13.2 12.0 - 16.0 g/dL   HCT 38.9 35.0 - 47.0 %   MCV 90.5 80.0 - 100.0 fL   MCH 30.6 26.0 - 34.0 pg   MCHC 33.8 32.0 - 36.0 g/dL   RDW 14.3 11.5 - 14.5 %   Platelets 197 150 - 440 K/uL  Fibrinogen     Status: Abnormal   Collection Time: 10/02/16  9:48 PM  Result Value Ref Range   Fibrinogen 178 (L) 210 - 475 mg/dL  Heparin level (unfractionated) every 6 hours x 4 post-procedure     Status: Abnormal   Collection Time: 10/02/16  9:52 PM  Result Value Ref Range   Heparin Unfractionated <0.10 (L) 0.30 - 0.70 IU/mL    Comment:        IF HEPARIN RESULTS ARE BELOW EXPECTED VALUES, AND PATIENT DOSAGE HAS BEEN CONFIRMED, SUGGEST FOLLOW UP TESTING OF ANTITHROMBIN III LEVELS.   Basic metabolic panel     Status: Abnormal   Collection Time: 10/03/16  3:31 AM  Result Value Ref Range   Sodium 140 135 - 145 mmol/L   Potassium 4.0 3.5 - 5.1 mmol/L   Chloride 111 101 - 111 mmol/L   CO2 22 22 - 32 mmol/L   Glucose, Bld 191 (H) 65 - 99 mg/dL   BUN 23 (H) 6 - 20 mg/dL   Creatinine, Ser 0.87 0.44 - 1.00 mg/dL   Calcium 9.0 8.9 - 10.3 mg/dL   GFR calc non Af Amer >60 >60 mL/min   GFR calc Af Amer >60 >60 mL/min    Comment: (NOTE) The eGFR has been calculated using the CKD EPI equation. This calculation has not been validated in all clinical situations. eGFR's persistently <60 mL/min signify possible Chronic Kidney Disease.    Anion gap 7  5 - 15  CBC     Status: Abnormal   Collection Time: 10/03/16  3:31 AM  Result Value Ref Range   WBC 13.0 (H) 3.6 - 11.0 K/uL   RBC 4.19 3.80 - 5.20 MIL/uL   Hemoglobin 13.1 12.0 - 16.0 g/dL   HCT 38.2 35.0 - 47.0 %   MCV 91.0 80.0 - 100.0 fL   MCH 31.3 26.0 - 34.0 pg   MCHC 34.4 32.0 - 36.0 g/dL   RDW 14.0 11.5 - 14.5 %   Platelets 180 150 - 440 K/uL  Heparin level (unfractionated) every 6 hours x 4 post-procedure     Status: Abnormal   Collection Time: 10/03/16  3:31 AM  Result Value Ref Range   Heparin Unfractionated <0.10 (L)  0.30 - 0.70 IU/mL    Comment:        IF HEPARIN RESULTS ARE BELOW EXPECTED VALUES, AND PATIENT DOSAGE HAS BEEN CONFIRMED, SUGGEST FOLLOW UP TESTING OF ANTITHROMBIN III LEVELS.   Fibrinogen     Status: Abnormal   Collection Time: 10/03/16  3:31 AM  Result Value Ref Range   Fibrinogen 158 (L) 210 - 475 mg/dL  CBC     Status: Abnormal   Collection Time: 10/03/16  9:55 AM  Result Value Ref Range   WBC 14.0 (H) 3.6 - 11.0 K/uL   RBC 4.17 3.80 - 5.20 MIL/uL   Hemoglobin 13.0 12.0 - 16.0 g/dL   HCT 37.9 35.0 - 47.0 %   MCV 90.9 80.0 - 100.0 fL   MCH 31.2 26.0 - 34.0 pg   MCHC 34.3 32.0 - 36.0 g/dL   RDW 14.6 (H) 11.5 - 14.5 %   Platelets 189 150 - 440 K/uL  Fibrinogen     Status: Abnormal   Collection Time: 10/03/16  9:55 AM  Result Value Ref Range   Fibrinogen 157 (L) 210 - 475 mg/dL  Heparin level (unfractionated) every 6 hours x 4 post-procedure     Status: Abnormal   Collection Time: 10/03/16  9:55 AM  Result Value Ref Range   Heparin Unfractionated 0.17 (L) 0.30 - 0.70 IU/mL    Comment:        IF HEPARIN RESULTS ARE BELOW EXPECTED VALUES, AND PATIENT DOSAGE HAS BEEN CONFIRMED, SUGGEST FOLLOW UP TESTING OF ANTITHROMBIN III LEVELS.   CBC     Status: Abnormal   Collection Time: 10/03/16  6:35 PM  Result Value Ref Range   WBC 14.7 (H) 3.6 - 11.0 K/uL   RBC 3.88 3.80 - 5.20 MIL/uL   Hemoglobin 12.1 12.0 - 16.0 g/dL   HCT 35.4 35.0 - 47.0 %   MCV 91.0 80.0 - 100.0 fL   MCH 31.1 26.0 - 34.0 pg   MCHC 34.2 32.0 - 36.0 g/dL   RDW 14.3 11.5 - 14.5 %   Platelets 167 150 - 440 K/uL  Heparin level (unfractionated) every 6 hours x 4 post-procedure     Status: Abnormal   Collection Time: 10/03/16  6:35 PM  Result Value Ref Range   Heparin Unfractionated <0.10 (L) 0.30 - 0.70 IU/mL    Comment:        IF HEPARIN RESULTS ARE BELOW EXPECTED VALUES, AND PATIENT DOSAGE HAS BEEN CONFIRMED, SUGGEST FOLLOW UP TESTING OF ANTITHROMBIN III LEVELS.   Basic metabolic panel     Status:  Abnormal   Collection Time: 10/03/16 10:45 PM  Result Value Ref Range   Sodium 139 135 - 145 mmol/L   Potassium 3.2 (L) 3.5 -  5.1 mmol/L   Chloride 109 101 - 111 mmol/L   CO2 24 22 - 32 mmol/L   Glucose, Bld 142 (H) 65 - 99 mg/dL   BUN 14 6 - 20 mg/dL   Creatinine, Ser 8.28 0.44 - 1.00 mg/dL   Calcium 8.5 (L) 8.9 - 10.3 mg/dL   GFR calc non Af Amer >60 >60 mL/min   GFR calc Af Amer >60 >60 mL/min    Comment: (NOTE) The eGFR has been calculated using the CKD EPI equation. This calculation has not been validated in all clinical situations. eGFR's persistently <60 mL/min signify possible Chronic Kidney Disease.    Anion gap 6 5 - 15  CBC     Status: Abnormal   Collection Time: 10/04/16  5:00 AM  Result Value Ref Range   WBC 10.6 3.6 - 11.0 K/uL   RBC 3.64 (L) 3.80 - 5.20 MIL/uL   Hemoglobin 11.4 (L) 12.0 - 16.0 g/dL   HCT 00.3 (L) 49.1 - 79.1 %   MCV 90.6 80.0 - 100.0 fL   MCH 31.4 26.0 - 34.0 pg   MCHC 34.6 32.0 - 36.0 g/dL   RDW 50.5 69.7 - 94.8 %   Platelets 148 (L) 150 - 440 K/uL  Basic metabolic panel     Status: Abnormal   Collection Time: 10/04/16  5:00 AM  Result Value Ref Range   Sodium 139 135 - 145 mmol/L   Potassium 4.0 3.5 - 5.1 mmol/L   Chloride 111 101 - 111 mmol/L   CO2 24 22 - 32 mmol/L   Glucose, Bld 131 (H) 65 - 99 mg/dL   BUN 11 6 - 20 mg/dL   Creatinine, Ser 0.16 0.44 - 1.00 mg/dL   Calcium 8.5 (L) 8.9 - 10.3 mg/dL   GFR calc non Af Amer >60 >60 mL/min   GFR calc Af Amer >60 >60 mL/min    Comment: (NOTE) The eGFR has been calculated using the CKD EPI equation. This calculation has not been validated in all clinical situations. eGFR's persistently <60 mL/min signify possible Chronic Kidney Disease.    Anion gap 4 (L) 5 - 15  Glucose, capillary     Status: Abnormal   Collection Time: 10/04/16  7:47 AM  Result Value Ref Range   Glucose-Capillary 114 (H) 65 - 99 mg/dL    Radiology No results found.   Assessment/Plan  Hypertension blood  pressure control important in reducing the progression of atherosclerotic disease. On appropriate oral medications.   Tobacco abuse We had a discussion for approximately 3-4 minutes regarding the absolute need for smoking cessation due to the deleterious nature of tobacco on the vascular system. We discussed the tobacco use would diminish patency of any intervention, and likely significantly worsen progressio of disease. We discussed multiple agents for quitting including replacement therapy or medications to reduce cravings such as Chantix. The patient voices their understanding of the importance of smoking cessation. She understands this is her primary risk factor for vascular disease this point.  PAD (peripheral artery disease) ([facility]) Her noninvasive studies today demonstrate normal ABIs of 1.1 on the right and 1.08 on the left with good brisk waveforms. She will continue her full anticoagulation as she now has a disadvantage graft as well as her aspirin. I will plan to see her back in 3 months with both duplex and ABIs. Smoking cessation strongly recommended.    Festus Barren, MD  11/19/2016 10:54 AM    This note was created with Dragon medical transcription  system.  Any errors from dictation are purely unintentional

## 2016-11-19 NOTE — Assessment & Plan Note (Signed)
blood pressure control important in reducing the progression of atherosclerotic disease. On appropriate oral medications.  

## 2016-11-19 NOTE — Assessment & Plan Note (Signed)
We had a discussion for approximately 3-4 minutes regarding the absolute need for smoking cessation due to the deleterious nature of tobacco on the vascular system. We discussed the tobacco use would diminish patency of any intervention, and likely significantly worsen progressio of disease. We discussed multiple agents for quitting including replacement therapy or medications to reduce cravings such as Chantix. The patient voices their understanding of the importance of smoking cessation. She understands this is her primary risk factor for vascular disease this point.

## 2017-01-08 DIAGNOSIS — M1712 Unilateral primary osteoarthritis, left knee: Secondary | ICD-10-CM | POA: Insufficient documentation

## 2017-02-21 ENCOUNTER — Ambulatory Visit (INDEPENDENT_AMBULATORY_CARE_PROVIDER_SITE_OTHER): Payer: Medicare Other | Admitting: Vascular Surgery

## 2017-02-21 ENCOUNTER — Ambulatory Visit (INDEPENDENT_AMBULATORY_CARE_PROVIDER_SITE_OTHER): Payer: Medicare Other

## 2017-02-21 ENCOUNTER — Encounter (INDEPENDENT_AMBULATORY_CARE_PROVIDER_SITE_OTHER): Payer: Self-pay | Admitting: Vascular Surgery

## 2017-02-21 VITALS — BP 108/63 | HR 69 | Resp 17 | Wt 157.0 lb

## 2017-02-21 DIAGNOSIS — I739 Peripheral vascular disease, unspecified: Secondary | ICD-10-CM

## 2017-02-21 DIAGNOSIS — Z720 Tobacco use: Secondary | ICD-10-CM

## 2017-02-21 DIAGNOSIS — F17210 Nicotine dependence, cigarettes, uncomplicated: Secondary | ICD-10-CM

## 2017-02-21 DIAGNOSIS — I10 Essential (primary) hypertension: Secondary | ICD-10-CM

## 2017-02-21 NOTE — Progress Notes (Signed)
MRN : [id number]  Mariah Rogers is a 66 y.o. (Aug 23, 1950) female who presents with chief complaint of  Chief Complaint  Patient presents with  . Follow-up    74mo abi,lle art  .  History of Present Illness: Patient returns today in follow up of PAD.  She underwent extensive revascularization about 4-5 months ago for limb salvage for an occluded aorto biiliac bypass left limb. She is doing well currently. She remains on anticoagulation. Her noninvasive studies today demonstrate excellent lower extremity arterial perfusion bilaterally with a right ABI of 1.19 and left ABI of 1.17. She has mildly elevated velocities consistent with a 50-99% stenosis in the left proximal SFA, but this is on the lower end of that range.  Current Outpatient Prescriptions  Medication Sig Dispense Refill  . apixaban (ELIQUIS) 5 MG TABS tablet Take 1 tablet (5 mg total) by mouth 2 (two) times daily. 60 tablet 5  . aspirin EC 81 MG EC tablet Take 1 tablet (81 mg total) by mouth daily. 30 tablet 5  . cholecalciferol (VITAMIN D) 1000 units tablet Take 1,000 Units by mouth daily.    Marland Kitchen ezetimibe-simvastatin (VYTORIN) 10-40 MG tablet Take 1 tablet by mouth daily.    Marland Kitchen lisinopril-hydrochlorothiazide (PRINZIDE,ZESTORETIC) 20-12.5 MG tablet Take 1 tablet by mouth daily.    Marland Kitchen omeprazole (PRILOSEC) 20 MG capsule Take 20 mg by mouth daily.    . sertraline (ZOLOFT) 50 MG tablet Take 50 mg by mouth at bedtime.    Marland Kitchen HYDROcodone-acetaminophen (NORCO/VICODIN) 5-325 MG tablet Take 1-2 tablets by mouth every 6 (six) hours as needed for moderate pain or severe pain. (Patient not taking: Reported on 11/19/2016) 30 tablet 0  . lisinopril (PRINIVIL,ZESTRIL) 20 MG tablet Take 20 mg by mouth daily.     No current facility-administered medications for this visit.         Past Medical History:  Diagnosis Date  . CAD (coronary artery disease)   . COPD (chronic obstructive pulmonary disease) ([place])   . GERD  (gastroesophageal reflux disease)   . Hernia, hiatal   . Hyperlipidemia   . Hypertension   . Peripheral vascular disease ([place])   . Thrombosis    in Lt leg         Past Surgical History:  Procedure Laterality Date  . ABDOMINAL AORTA STENT    . ABDOMINAL HYSTERECTOMY    . aortobifemoral bypass  2005  . CESAREAN SECTION     x 3  . femoral stents    . LOWER EXTREMITY ANGIOGRAPHY Left 10/02/2016   Procedure: Lower Extremity Angiography;  Surgeon: Algernon Huxley, MD;  Location: [facility];  Service: Cardiovascular;  Laterality: Left;  . LOWER EXTREMITY ANGIOGRAPHY N/A 10/03/2016   Procedure: Lower Extremity Angiography with possible revascularization;  Surgeon: Algernon Huxley, MD;  Location: [facility];  Service: Cardiovascular;  Laterality: N/A;    Social History      Social History  Substance Use Topics  . Smoking status: Current Every Day Smoker    Packs/day: 0.50    Years: 60.00    Types: Cigarettes  . Smokeless tobacco: Never Used  . Alcohol use No    Family History      Family History  Problem Relation Age of Onset  . Osteoporosis Mother   . Alzheimer's disease Mother         Allergies  Allergen Reactions  . Codeine Nausea And Vomiting     REVIEW OF SYSTEMS (Negative  unless checked)  Constitutional: [] Weight loss  [] Fever  [] Chills Cardiac: [] Chest pain   [] Chest pressure   [] Palpitations   [] Shortness of breath when laying flat   [] Shortness of breath at rest   [x] Shortness of breath with exertion. Vascular:  [] Pain in legs with walking   [] Pain in legs at rest   [] Pain in legs when laying flat   [] Claudication   [] Pain in feet when walking  [] Pain in feet at rest  [] Pain in feet when laying flat   [] History of DVT   [] Phlebitis   [] Swelling in legs   [] Varicose veins   [] Non-healing ulcers Pulmonary:   [] Uses home oxygen   [] Productive cough   [] Hemoptysis   [] Wheeze  [x] COPD   [] Asthma Neurologic:   [] Dizziness  [] Blackouts   [] Seizures   [] History of stroke   [] History of TIA  [] Aphasia   [] Temporary blindness   [] Dysphagia   [] Weakness or numbness in arms   [] Weakness or numbness in legs Musculoskeletal:  [x] Arthritis   [] Joint swelling   [] Joint pain   [] Low back pain Hematologic:  [] Easy bruising  [] Easy bleeding   [] Hypercoagulable state   [] Anemic   Gastrointestinal:  [] Blood in stool   [] Vomiting blood  [] Gastroesophageal reflux/heartburn   [] Abdominal pain Genitourinary:  [] Chronic kidney disease   [] Difficult urination  [] Frequent urination  [] Burning with urination   [] Hematuria Skin:  [] Rashes   [] Ulcers   [] Wounds Psychological:  [] History of anxiety   []  History of major depression.   Physical Examination  BP 108/63   Pulse 69   Resp 17   Wt 71.2 kg (157 lb)   BMI 27.81 kg/m  Gen:  WD/WN, NAD Head: /AT, No temporalis wasting. Ear/Nose/Throat: Hearing grossly intact, nares w/o erythema or drainage, trachea midline Eyes: Conjunctiva clear. Sclera non-icteric Neck: Supple.  No JVD.  Pulmonary:  Good air movement, no use of accessory muscles.  Cardiac: RRR, normal S1, S2 Vascular:  Vessel Right Left  Radial Palpable Palpable                          PT Palpable Palpable  DP Palpable Palpable    Musculoskeletal: M/S 5/5 throughout.  No deformity or atrophy. No edema. Neurologic: Sensation grossly intact in extremities.  Symmetrical.  Speech is fluent.  Psychiatric: Judgment intact, Mood & affect appropriate for pt's clinical situation. Dermatologic: No rashes or ulcers noted.  No cellulitis or open wounds.       Labs Recent Results (from the past 2160 hour(s))  VAS [place] LOWER EXTREMITY ARTERIAL DUPLEX     Status: None (In process)   Collection Time: 02/21/17  2:55 PM  Result Value Ref Range   Left super femoral prox sys PSV 97 cm/s   Left super femoral mid sys PSV 92 cm/s   Left super femoral dist sys PSV -80 cm/s   Left popliteal prox sys PSV 58  cm/s   Left popliteal dist sys PSV 51 cm/s   Left ant tibial distal sys 53 cm/s   left post tibial dist sys 45 cm/s   LEFT PERO DIST SYS -34.00 cm/s    Radiology No results found.    Assessment/Plan Hypertension blood pressure control important in reducing the progression of atherosclerotic disease. On appropriate oral medications.  Tobacco abuse We had a discussion for approximately 3 minutes regarding the absolute need for smoking cessation due to the deleterious nature of tobacco on the vascular system. We discussed  the tobacco use would diminish patency of any intervention, and likely significantly worsen progressio of disease. We discussed multiple agents for quitting including replacement therapy or medications to reduce cravings such as Chantix. The patient voices their understanding of the importance of smoking cessation.   PAD (peripheral artery disease) ([place]) Her noninvasive studies today demonstrate excellent lower extremity arterial perfusion bilaterally with a right ABI of 1.19 and left ABI of 1.17. She has mildly elevated velocities consistent with a 50-99% stenosis in the left proximal SFA, but this is on the lower end of that range. Overall, she is doing quite well. I given her a refill on her anticoagulant today. She should walk as much as possible. We have again strongly recommended smoking cessation and I will see her back in 6 months with noninvasive studies.    Leotis Pain, MD  02/21/2017 3:28 PM    This note was created with Dragon medical transcription system.  Any errors from dictation are purely unintentional

## 2017-02-21 NOTE — Assessment & Plan Note (Signed)

## 2017-02-21 NOTE — Patient Instructions (Signed)

## 2017-02-21 NOTE — Assessment & Plan Note (Signed)
Her noninvasive studies today demonstrate excellent lower extremity arterial perfusion bilaterally with a right ABI of 1.19 and left ABI of 1.17. She has mildly elevated velocities consistent with a 50-99% stenosis in the left proximal SFA, but this is on the lower end of that range. Overall, she is doing quite well. I given her a refill on her anticoagulant today. She should walk as much as possible. We have again strongly recommended smoking cessation and I will see her back in 6 months with noninvasive studies.

## 2017-08-22 ENCOUNTER — Encounter (INDEPENDENT_AMBULATORY_CARE_PROVIDER_SITE_OTHER): Payer: Medicare Other

## 2017-08-22 ENCOUNTER — Ambulatory Visit (INDEPENDENT_AMBULATORY_CARE_PROVIDER_SITE_OTHER): Payer: Medicare Other | Admitting: Vascular Surgery

## 2017-09-19 ENCOUNTER — Telehealth: Payer: Self-pay | Admitting: *Deleted

## 2017-09-19 DIAGNOSIS — Z87891 Personal history of nicotine dependence: Secondary | ICD-10-CM

## 2017-09-19 DIAGNOSIS — Z122 Encounter for screening for malignant neoplasm of respiratory organs: Secondary | ICD-10-CM

## 2017-09-19 NOTE — Telephone Encounter (Signed)
Notified patient that annual lung cancer screening low dose CT scan is due currently or will be in near future. Confirmed that patient is within the age range of 55-77, and asymptomatic, (no signs or symptoms of lung cancer). Patient denies illness that would prevent curative treatment for lung cancer if found. Verified smoking history, (current, 60.5 pack year). The shared decision making visit was done 09/25/16. Patient is agreeable for CT scan being scheduled.

## 2017-09-25 ENCOUNTER — Ambulatory Visit
Admission: RE | Admit: 2017-09-25 | Discharge: 2017-09-25 | Disposition: A | Discharge status: Home / Self Care | Payer: Medicare Other | Source: Ambulatory Visit | Attending: Oncology | Admitting: Oncology

## 2017-09-25 DIAGNOSIS — Z122 Encounter for screening for malignant neoplasm of respiratory organs: Secondary | ICD-10-CM | POA: Diagnosis present

## 2017-09-25 DIAGNOSIS — Z87891 Personal history of nicotine dependence: Secondary | ICD-10-CM | POA: Insufficient documentation

## 2017-09-25 DIAGNOSIS — J432 Centrilobular emphysema: Secondary | ICD-10-CM | POA: Insufficient documentation

## 2017-09-25 DIAGNOSIS — I2510 Atherosclerotic heart disease of native coronary artery without angina pectoris: Secondary | ICD-10-CM | POA: Insufficient documentation

## 2017-09-25 DIAGNOSIS — I700 Atherosclerosis of aorta: Secondary | ICD-10-CM | POA: Insufficient documentation

## 2017-09-26 ENCOUNTER — Ambulatory Visit (INDEPENDENT_AMBULATORY_CARE_PROVIDER_SITE_OTHER): Payer: Medicare Other | Admitting: Vascular Surgery

## 2017-09-26 ENCOUNTER — Ambulatory Visit (INDEPENDENT_AMBULATORY_CARE_PROVIDER_SITE_OTHER): Payer: Medicare Other

## 2017-09-26 DIAGNOSIS — I739 Peripheral vascular disease, unspecified: Secondary | ICD-10-CM

## 2017-09-29 ENCOUNTER — Encounter: Payer: Self-pay | Admitting: *Deleted

## 2017-10-06 ENCOUNTER — Other Ambulatory Visit (INDEPENDENT_AMBULATORY_CARE_PROVIDER_SITE_OTHER): Payer: Self-pay | Admitting: Vascular Surgery

## 2018-03-02 ENCOUNTER — Other Ambulatory Visit: Payer: Self-pay | Admitting: Family Medicine

## 2018-03-02 DIAGNOSIS — M810 Age-related osteoporosis without current pathological fracture: Secondary | ICD-10-CM

## 2018-04-14 ENCOUNTER — Other Ambulatory Visit (INDEPENDENT_AMBULATORY_CARE_PROVIDER_SITE_OTHER): Payer: Self-pay

## 2018-04-14 ENCOUNTER — Other Ambulatory Visit (INDEPENDENT_AMBULATORY_CARE_PROVIDER_SITE_OTHER): Payer: Self-pay | Admitting: Vascular Surgery

## 2018-04-14 DIAGNOSIS — I6529 Occlusion and stenosis of unspecified carotid artery: Secondary | ICD-10-CM

## 2018-04-14 MED ORDER — APIXABAN 5 MG PO TABS: 5.0000 mg | ORAL_TABLET | Freq: Two times a day (BID) | ORAL | 4 refills | Status: AC

## 2018-05-28 ENCOUNTER — Other Ambulatory Visit
Admission: RE | Admit: 2018-05-28 | Discharge: 2018-05-28 | Disposition: A | Discharge status: Home / Self Care | Payer: Medicare Other | Source: Ambulatory Visit | Attending: Orthopedic Surgery | Admitting: Orthopedic Surgery

## 2018-05-28 DIAGNOSIS — Z01812 Encounter for preprocedural laboratory examination: Secondary | ICD-10-CM | POA: Insufficient documentation

## 2018-05-28 LAB — SYNOVIAL CELL COUNT + DIFF, W/ CRYSTALS
Eosinophils-Synovial: 0 %
Lymphocytes-Synovial Fld: 2 %
Monocyte-Macrophage-Synovial Fluid: 39 %
Neutrophil, Synovial: 59 %
WBC, Synovial: 637 /mm3 — ABNORMAL HIGH (ref 0–200)

## 2018-06-01 LAB — BODY FLUID CULTURE: Culture: NO GROWTH

## 2018-09-03 ENCOUNTER — Encounter: Payer: Self-pay | Admitting: *Deleted

## 2018-09-05 ENCOUNTER — Encounter: Payer: Self-pay | Admitting: *Deleted

## 2018-11-13 ENCOUNTER — Telehealth: Payer: Self-pay | Admitting: *Deleted

## 2018-11-13 DIAGNOSIS — Z122 Encounter for screening for malignant neoplasm of respiratory organs: Secondary | ICD-10-CM

## 2018-11-13 DIAGNOSIS — Z87891 Personal history of nicotine dependence: Secondary | ICD-10-CM

## 2018-11-13 NOTE — Telephone Encounter (Signed)
Patient has been notified that annual lung cancer screening low dose CT scan is due currently or will be in near future. Confirmed that patient is within the age range of 55-77, and asymptomatic, (no signs or symptoms of lung cancer). Patient denies illness that would prevent curative treatment for lung cancer if found. Verified smoking history, (current, 61 pack year). The shared decision making visit was done 09/25/16. Patient is agreeable for CT scan being scheduled.

## 2018-11-18 ENCOUNTER — Other Ambulatory Visit: Payer: Self-pay

## 2018-11-18 ENCOUNTER — Ambulatory Visit
Admission: RE | Admit: 2018-11-18 | Discharge: 2018-11-18 | Disposition: A | Discharge status: Home / Self Care | Payer: Medicare Other | Source: Ambulatory Visit | Attending: Oncology | Admitting: Oncology

## 2018-11-18 DIAGNOSIS — Z122 Encounter for screening for malignant neoplasm of respiratory organs: Secondary | ICD-10-CM | POA: Diagnosis not present

## 2018-11-18 DIAGNOSIS — Z87891 Personal history of nicotine dependence: Secondary | ICD-10-CM

## 2018-11-20 ENCOUNTER — Encounter: Payer: Self-pay | Admitting: *Deleted

## 2019-02-25 ENCOUNTER — Other Ambulatory Visit: Payer: Self-pay

## 2019-02-25 ENCOUNTER — Ambulatory Visit (INDEPENDENT_AMBULATORY_CARE_PROVIDER_SITE_OTHER): Payer: Medicare Other | Admitting: Gastroenterology

## 2019-02-25 ENCOUNTER — Encounter: Payer: Self-pay | Admitting: Gastroenterology

## 2019-02-25 ENCOUNTER — Encounter (INDEPENDENT_AMBULATORY_CARE_PROVIDER_SITE_OTHER): Payer: Self-pay

## 2019-02-25 VITALS — BP 107/59 | HR 77 | Temp 98.0°F | Ht 62.0 in | Wt 155.2 lb

## 2019-02-25 DIAGNOSIS — K219 Gastro-esophageal reflux disease without esophagitis: Secondary | ICD-10-CM | POA: Diagnosis not present

## 2019-02-25 DIAGNOSIS — Z1211 Encounter for screening for malignant neoplasm of colon: Secondary | ICD-10-CM | POA: Diagnosis not present

## 2019-02-25 DIAGNOSIS — Z1212 Encounter for screening for malignant neoplasm of rectum: Secondary | ICD-10-CM

## 2019-02-25 DIAGNOSIS — R1013 Epigastric pain: Secondary | ICD-10-CM | POA: Diagnosis not present

## 2019-02-25 MED ORDER — FAMOTIDINE 20 MG PO TABS: 20.0000 mg | ORAL_TABLET | Freq: Every day | ORAL | 0 refills | Status: DC

## 2019-02-25 MED ORDER — BISACODYL 5 MG PO TBEC: DELAYED_RELEASE_TABLET | ORAL | 0 refills | Status: DC

## 2019-02-25 MED ORDER — NA SULFATE-K SULFATE-MG SULF 17.5-3.13-1.6 GM/177ML PO SOLN: 354.0000 mL | Freq: Once | ORAL | 0 refills | Status: AC

## 2019-02-25 NOTE — Progress Notes (Signed)
Mariah Rogers [address]  [place]  [place], [place] [address]  Main: [phone]  Fax: [phone]   Gastroenterology Consultation  Referring Provider:     Letta Median, MD Primary Care Physician:  Letta Median, MD Reason for Consultation:     Chronic abdominal pain        HPI:    Chief Complaint  Patient presents with  . New Patient (Initial Visit)  . Abdominal Pain    Several years has had diarrhea and constipation     Mariah Rogers is a 68 y.o. y/o female referred for consultation & management  by Dr. Rebeca Alert, Durene Cal, MD.  Patient reports 2 to 3-year history of chronic abdominal bloating and pain and early satiety.  Does report going multiple days without a bowel movement and straining with bowel movements at times.  Does not use anything to help her go.  States gets bloated right after eating.  No prior EGD or colonoscopy.  Also reports that her daughter was diagnosed with stage IV colon cancer about a year ago when she was 68 years old and died from it.  The patient denies  anorexia, nausea or vomiting, dysphagia, change in bowel habits or black or bloody stools or weight loss.    Past Medical History:  Diagnosis Date  . CAD (coronary artery disease)   . COPD (chronic obstructive pulmonary disease) ([place])   . GERD (gastroesophageal reflux disease)   . Hernia, hiatal   . Hyperlipidemia   . Hypertension   . Peripheral vascular disease ([place])   . Thrombosis    in Lt leg    Past Surgical History:  Procedure Laterality Date  . ABDOMINAL AORTA STENT    . ABDOMINAL HYSTERECTOMY    . aortobifemoral bypass  2005  . CESAREAN SECTION     x 3  . femoral stents    . LOWER EXTREMITY ANGIOGRAPHY Left 10/02/2016   Procedure: Lower Extremity Angiography;  Surgeon: Algernon Huxley, MD;  Location: [facility];  Service: Cardiovascular;  Laterality: Left;  . LOWER EXTREMITY ANGIOGRAPHY N/A 10/03/2016   Procedure: Lower Extremity  Angiography with possible revascularization;  Surgeon: Algernon Huxley, MD;  Location: [facility];  Service: Cardiovascular;  Laterality: N/A;    Prior to Admission medications   Medication Sig Start Date End Date Taking? Authorizing Provider  albuterol (PROAIR HFA) 108 (90 Base) MCG/ACT inhaler ProAir HFA 90 mcg/actuation aerosol inhaler 11/04/16  Yes [provider]  apixaban (ELIQUIS) 5 MG TABS tablet Take 1 tablet (5 mg total) by mouth 2 (two) times daily. 04/14/18  Yes Algernon Huxley, MD  aspirin EC 81 MG EC tablet Take 1 tablet (81 mg total) by mouth daily. 10/05/16  Yes Stegmayer, Janalyn Harder, PA-C  buPROPion (WELLBUTRIN SR) 150 MG 12 hr tablet bupropion HCl SR 150 mg tablet,12 hr sustained-release   Yes [provider]  cholecalciferol (VITAMIN D) 1000 units tablet Take 1,000 Units by mouth daily.   Yes [provider]  dicyclomine (BENTYL) 10 MG capsule dicyclomine 10 mg capsule   Yes [provider]  ezetimibe-simvastatin (VYTORIN) 10-40 MG tablet Take 1 tablet by mouth daily.   Yes [provider]  Fluticasone-Salmeterol (ADVAIR) 250-50 MCG/DOSE AEPB fluticasone 250 mcg-salmeterol 50 mcg/dose blistr powdr for inhalation   Yes [provider]  lisinopril-hydrochlorothiazide (PRINZIDE,ZESTORETIC) 20-12.5 MG tablet Take 1 tablet by mouth daily.   Yes [provider]  meloxicam (MOBIC) 15 MG  tablet meloxicam 15 mg tablet   Yes [provider]  metFORMIN (GLUCOPHAGE-XR) 500 MG 24 hr tablet  12/31/18  Yes [provider]  nortriptyline (PAMELOR) 10 MG capsule  01/04/19  Yes [provider]  sertraline (ZOLOFT) 100 MG tablet sertraline 100 mg tablet   Yes [provider]  bisacodyl (DULCOLAX) 5 MG EC tablet Please take 2 tablets (10mg ) once between 1PM and 3pm before your procedure 02/25/19   Virgel Manifold, MD  famotidine (PEPCID) 20 MG tablet Take 1 tablet (20 mg total) by mouth daily.  02/25/19 03/27/19  Virgel Manifold, MD  Na Sulfate-K Sulfate-Mg Sulf 17.5-3.13-1.6 GM/177ML SOLN Take 354 mLs by mouth once for 1 dose. 02/25/19 02/25/19  Virgel Manifold, MD    Family History  Problem Relation Age of Onset  . Osteoporosis Mother   . Alzheimer's disease Mother      Social History   Tobacco Use  . Smoking status: Current Every Day Smoker    Packs/day: 0.50    Years: 60.00    Pack years: 30.00    Types: Cigarettes  . Smokeless tobacco: Never Used  Substance Use Topics  . Alcohol use: No  . Drug use: Yes    Types: Marijuana    Comment: Former marijuana use    Allergies as of 02/25/2019 - Review Complete 02/25/2019  Allergen Reaction Noted  . Codeine Nausea And Vomiting 12/23/2012    Review of Systems:    All systems reviewed and negative except where noted in HPI.   Physical Exam:  BP (!) 107/59 (BP Location: Left Arm, Patient Position: Sitting, Cuff Size: Normal)   Pulse 77   Temp 98 F (36.7 C) (Oral)   Ht 5\' 2"  (1.575 m)   Wt 155 lb 4 oz (70.4 kg)   BMI 28.40 kg/m  No LMP recorded. Patient has had a hysterectomy. Psych:  Alert and cooperative. Normal mood and affect. General:   Alert,  Well-developed, well-nourished, pleasant and cooperative in NAD Head:  Normocephalic and atraumatic. Eyes:  Sclera clear, no icterus.   Conjunctiva pink. Ears:  Normal auditory acuity. Nose:  No deformity, discharge, or lesions. Mouth:  No deformity or lesions,oropharynx pink & moist. Neck:  Supple; no masses or thyromegaly. Abdomen:  Normal bowel sounds.  No bruits.  Soft, non-tender and non-distended without masses, hepatosplenomegaly or hernias noted.  No guarding or rebound tenderness.    Msk:  Symmetrical without gross deformities. Good, equal movement & strength bilaterally. Pulses:  Normal pulses noted. Extremities:  No clubbing or edema.  No cyanosis. Neurologic:  Alert and oriented x3;  grossly normal neurologically. Skin:  Intact without  significant lesions or rashes. No jaundice. Lymph Nodes:  No significant cervical adenopathy. Psych:  Alert and cooperative. Normal mood and affect.   Labs: CBC    Component Value Date/Time   WBC 10.6 10/04/2016 0500   RBC 3.64 (L) 10/04/2016 0500   HGB 11.4 (L) 10/04/2016 0500   HCT 33.0 (L) 10/04/2016 0500   PLT 148 (L) 10/04/2016 0500   MCV 90.6 10/04/2016 0500   MCH 31.4 10/04/2016 0500   MCHC 34.6 10/04/2016 0500   RDW 14.2 10/04/2016 0500   CMP     Component Value Date/Time   NA 139 10/04/2016 0500   NA 142 03/22/2015   K 4.0 10/04/2016 0500   CL 111 10/04/2016 0500   CO2 24 10/04/2016 0500   GLUCOSE 131 (H) 10/04/2016 0500   BUN 11 10/04/2016 0500  BUN 13 03/22/2015   CREATININE 0.70 10/04/2016 0500   CALCIUM 8.5 (L) 10/04/2016 0500   AST 17 03/22/2015   ALT 19 03/22/2015   ALKPHOS 73 03/22/2015   GFRNONAA >60 10/04/2016 0500   GFRAA >60 10/04/2016 0500    Imaging Studies: No results found.  Assessment and Plan:   AVEENA DEPHILLIPS is a 68 y.o. y/o female has been referred for chronic abdominal pain and bloating, with constipation, and family history of colon cancer with no prior EGD or colonoscopy  Colonoscopy indicated for high risk screening  Abdominal pain and bloating, most consistent with likely being from constipation  High-fiber diet MiraLAX daily with goal of 1-2 soft bowel movements daily.  If not at goal, patient instructed to increase dose to twice daily.  If loose stools with the medication, patient asked to decrease the medication to every other day, or half dose daily.  Patient verbalized understanding  Patient has also chronically been on omeprazole for years which controls her indigestion and heartburn.  However, if she misses a dose it returns.  Due to chronic reflux, and her age, further evaluation for Barrett's also indicated with EGD.  It would also allow [place] to biopsy for H. pylori given her abdominal bloating and rule out any underlying  lesions.  She is agreeable to try to get off the omeprazole and is willing to try Pepcid instead.  Sent to pharmacy.  If symptoms return on the Pepcid I have asked her to call us and she verbalized understanding  I have discussed alternative options, risks & benefits,  which include, but are not limited to, bleeding, infection, perforation,respiratory complication & drug reaction.  The patient agrees with this plan & written consent will be obtained.    (Risks of PPI use were discussed with patient including bone loss, C. Diff diarrhea, pneumonia, infections, CKD, electrolyte abnormalities.   Pt. Verbalizes understanding and chooses to continue the medication.)    Dr Mariah Rogers  Speech recognition software was used to dictate the above note.

## 2019-02-26 ENCOUNTER — Telehealth: Payer: Self-pay

## 2019-02-26 NOTE — Telephone Encounter (Signed)
Per Dr. Lucky Cowboy patient needs to stop the Eliquis 5mg  3 days before procedure and then restart it 1 day before procedure. Called patient and patient verbalized understanding of stopping the medication and restarting it.

## 2019-03-05 ENCOUNTER — Other Ambulatory Visit: Payer: Self-pay

## 2019-03-05 ENCOUNTER — Encounter
Admission: RE | Admit: 2019-03-05 | Discharge: 2019-03-05 | Disposition: A | Discharge status: Home / Self Care | Payer: Medicare Other | Source: Ambulatory Visit | Attending: Gastroenterology | Admitting: Gastroenterology

## 2019-03-05 DIAGNOSIS — Z20828 Contact with and (suspected) exposure to other viral communicable diseases: Secondary | ICD-10-CM | POA: Diagnosis not present

## 2019-03-05 DIAGNOSIS — Z01812 Encounter for preprocedural laboratory examination: Secondary | ICD-10-CM | POA: Diagnosis not present

## 2019-03-05 LAB — SARS CORONAVIRUS 2 (TAT 6-24 HRS): SARS Coronavirus 2: NEGATIVE

## 2019-03-10 ENCOUNTER — Ambulatory Visit
Admission: RE | Admit: 2019-03-10 | Discharge: 2019-03-10 | Disposition: A | Discharge status: Home / Self Care | Payer: Medicare Other | Attending: Gastroenterology | Admitting: Gastroenterology

## 2019-03-10 ENCOUNTER — Ambulatory Visit: Payer: Medicare Other | Admitting: Certified Registered Nurse Anesthetist

## 2019-03-10 ENCOUNTER — Encounter: Payer: Self-pay | Admitting: Certified Registered Nurse Anesthetist

## 2019-03-10 ENCOUNTER — Encounter
Admission: RE | Disposition: A | Discharge status: Home / Self Care | Payer: Self-pay | Source: Home / Self Care | Attending: Gastroenterology

## 2019-03-10 DIAGNOSIS — E785 Hyperlipidemia, unspecified: Secondary | ICD-10-CM | POA: Insufficient documentation

## 2019-03-10 DIAGNOSIS — Z8262 Family history of osteoporosis: Secondary | ICD-10-CM | POA: Insufficient documentation

## 2019-03-10 DIAGNOSIS — Z7901 Long term (current) use of anticoagulants: Secondary | ICD-10-CM | POA: Diagnosis not present

## 2019-03-10 DIAGNOSIS — K449 Diaphragmatic hernia without obstruction or gangrene: Secondary | ICD-10-CM | POA: Insufficient documentation

## 2019-03-10 DIAGNOSIS — Z1211 Encounter for screening for malignant neoplasm of colon: Secondary | ICD-10-CM

## 2019-03-10 DIAGNOSIS — Z800 Family history of malignant neoplasm of digestive organs: Secondary | ICD-10-CM | POA: Diagnosis not present

## 2019-03-10 DIAGNOSIS — F17210 Nicotine dependence, cigarettes, uncomplicated: Secondary | ICD-10-CM | POA: Insufficient documentation

## 2019-03-10 DIAGNOSIS — Z820 Family history of epilepsy and other diseases of the nervous system: Secondary | ICD-10-CM | POA: Diagnosis not present

## 2019-03-10 DIAGNOSIS — D124 Benign neoplasm of descending colon: Secondary | ICD-10-CM | POA: Diagnosis not present

## 2019-03-10 DIAGNOSIS — Z90710 Acquired absence of both cervix and uterus: Secondary | ICD-10-CM | POA: Diagnosis not present

## 2019-03-10 DIAGNOSIS — Z7982 Long term (current) use of aspirin: Secondary | ICD-10-CM | POA: Insufficient documentation

## 2019-03-10 DIAGNOSIS — Z885 Allergy status to narcotic agent status: Secondary | ICD-10-CM | POA: Insufficient documentation

## 2019-03-10 DIAGNOSIS — Z79899 Other long term (current) drug therapy: Secondary | ICD-10-CM | POA: Insufficient documentation

## 2019-03-10 DIAGNOSIS — R1013 Epigastric pain: Secondary | ICD-10-CM | POA: Insufficient documentation

## 2019-03-10 DIAGNOSIS — Z7951 Long term (current) use of inhaled steroids: Secondary | ICD-10-CM | POA: Insufficient documentation

## 2019-03-10 DIAGNOSIS — Z86718 Personal history of other venous thrombosis and embolism: Secondary | ICD-10-CM | POA: Insufficient documentation

## 2019-03-10 DIAGNOSIS — Z7984 Long term (current) use of oral hypoglycemic drugs: Secondary | ICD-10-CM | POA: Diagnosis not present

## 2019-03-10 DIAGNOSIS — K635 Polyp of colon: Secondary | ICD-10-CM

## 2019-03-10 DIAGNOSIS — K222 Esophageal obstruction: Secondary | ICD-10-CM | POA: Insufficient documentation

## 2019-03-10 DIAGNOSIS — D120 Benign neoplasm of cecum: Secondary | ICD-10-CM | POA: Diagnosis not present

## 2019-03-10 DIAGNOSIS — I2510 Atherosclerotic heart disease of native coronary artery without angina pectoris: Secondary | ICD-10-CM | POA: Insufficient documentation

## 2019-03-10 DIAGNOSIS — Z791 Long term (current) use of non-steroidal anti-inflammatories (NSAID): Secondary | ICD-10-CM | POA: Insufficient documentation

## 2019-03-10 DIAGNOSIS — K219 Gastro-esophageal reflux disease without esophagitis: Secondary | ICD-10-CM | POA: Diagnosis not present

## 2019-03-10 DIAGNOSIS — Z1212 Encounter for screening for malignant neoplasm of rectum: Secondary | ICD-10-CM

## 2019-03-10 DIAGNOSIS — I10 Essential (primary) hypertension: Secondary | ICD-10-CM | POA: Insufficient documentation

## 2019-03-10 DIAGNOSIS — J449 Chronic obstructive pulmonary disease, unspecified: Secondary | ICD-10-CM | POA: Insufficient documentation

## 2019-03-10 DIAGNOSIS — I739 Peripheral vascular disease, unspecified: Secondary | ICD-10-CM | POA: Diagnosis not present

## 2019-03-10 HISTORY — PX: COLONOSCOPY WITH PROPOFOL: SHX5780

## 2019-03-10 HISTORY — PX: ESOPHAGOGASTRODUODENOSCOPY (EGD) WITH PROPOFOL: SHX5813

## 2019-03-10 LAB — GLUCOSE, CAPILLARY: Glucose-Capillary: 121 mg/dL — ABNORMAL HIGH (ref 70–99)

## 2019-03-10 SURGERY — COLONOSCOPY WITH PROPOFOL
Anesthesia: General

## 2019-03-10 MED ORDER — SODIUM CHLORIDE 0.9 % IV SOLN
INTRAVENOUS | Status: DC
  Administered 2019-03-10: 09:00:00 via INTRAVENOUS

## 2019-03-10 MED ORDER — PROPOFOL 500 MG/50ML IV EMUL
INTRAVENOUS | Status: AC
  Filled 2019-03-10: qty 50

## 2019-03-10 MED ORDER — LIDOCAINE HCL (CARDIAC) PF 100 MG/5ML IV SOSY
PREFILLED_SYRINGE | INTRAVENOUS | Status: DC | PRN
  Administered 2019-03-10: 50 mg via INTRAVENOUS

## 2019-03-10 MED ORDER — PROPOFOL 10 MG/ML IV BOLUS
INTRAVENOUS | Status: DC | PRN
  Administered 2019-03-10 (×2): 21 mg via INTRAVENOUS
  Administered 2019-03-10: 70 mg via INTRAVENOUS

## 2019-03-10 MED ORDER — PROPOFOL 500 MG/50ML IV EMUL
INTRAVENOUS | Status: DC | PRN
  Administered 2019-03-10: 130 ug/kg/min via INTRAVENOUS

## 2019-03-10 MED ORDER — PHENYLEPHRINE HCL (PRESSORS) 10 MG/ML IV SOLN
INTRAVENOUS | Status: DC | PRN
  Administered 2019-03-10: 100 ug via INTRAVENOUS

## 2019-03-10 MED ORDER — LIDOCAINE HCL (PF) 2 % IJ SOLN
INTRAMUSCULAR | Status: AC
  Filled 2019-03-10: qty 10

## 2019-03-10 NOTE — Op Note (Signed)
[facility] Patient Name: Mariah Rogers Procedure Date: 03/10/2019 9:45 AM MRN: [id number] Account #: [id number] Date of Birth: [birth year] Admit Type: Outpatient Age: 68 Room: [facility] ENDO ROOM 2 Gender: Female Note Status: Finalized Procedure:            Upper GI endoscopy Indications:          Abdominal pain, Heartburn Providers:            Adelina Collard B. Bonna Gains MD, MD Referring MD:         [name] MD, MD (Referring MD) Medicines:            Monitored Anesthesia Care Complications:        No immediate complications. Procedure:            Pre-Anesthesia Assessment:                       - Prior to the procedure, a History and Physical was                        performed, and patient medications, allergies and                        sensitivities were reviewed. The patient's tolerance of                        previous anesthesia was reviewed.                       - The risks and benefits of the procedure and the                        sedation options and risks were discussed with the                        patient. All questions were answered and informed                        consent was obtained.                       - Patient identification and proposed procedure were                        verified prior to the procedure by the physician, the                        nurse, the anesthesiologist, the anesthetist and the                        technician. The procedure was verified in the procedure                        room.                       - ASA Grade Assessment: II - A patient with mild                        systemic disease.  After obtaining informed consent, the endoscope was                        passed under direct vision. Throughout the procedure,                        the patient's blood pressure, pulse, and oxygen                        saturations were monitored continuously. The Endoscope                  was introduced through the mouth, and advanced to the                        second part of duodenum. The upper GI endoscopy was                        accomplished with ease. The patient tolerated the                        procedure well. Findings:      The examined esophagus was normal.      A widely patent and non-obstructing Schatzki ring was found at the       gastroesophageal junction.      A 2 cm hiatal hernia was present.      The entire examined stomach was normal. Biopsies were obtained in the       gastric body, at the incisura and in the gastric antrum with cold       forceps for histology. Biopsies were taken with a cold forceps for       Helicobacter pylori testing.      The duodenal bulb, second portion of the duodenum and examined duodenum       were normal. Impression:           - Normal esophagus.                       - Widely patent and non-obstructing Schatzki ring.                       - 2 cm hiatal hernia.                       - Normal stomach. Biopsied.                       - Normal duodenal bulb, second portion of the duodenum                        and examined duodenum.                       - Biopsies were obtained in the gastric body, at the                        incisura and in the gastric antrum. Recommendation:       - Discharge patient to home (with escort).                       - Advance diet as tolerated.                       -  Continue present medications.                       - Patient has a contact number available for                        emergencies. The signs and symptoms of potential                        delayed complications were discussed with the patient.                        Return to normal activities tomorrow. Written discharge                        instructions were provided to the patient.                       - Discharge patient to home (with escort).                       - The findings and  recommendations were discussed with                        the patient.                       - The findings and recommendations were discussed with                        the patient's family.                       - Follow an antireflux regimen.                       - Return to my office in 4 weeks. Procedure Code(s):    --- Professional ---                       [phone], Esophagogastroduodenoscopy, flexible, transoral;                        with biopsy, single or multiple Diagnosis Code(s):    --- Professional ---                       K22.2, Esophageal obstruction                       K44.9, Diaphragmatic hernia without obstruction or                        gangrene                       R10.9, Unspecified abdominal pain                       R12, Heartburn CPT copyright 2019 American Medical Association. All rights reserved. The codes documented in this report are preliminary and upon coder review may  be revised to meet current compliance requirements.  [name], MD [name] MD, MD 03/10/2019 10:05:14 AM This report has been signed electronically. Number of Addenda: 0 Note  Initiated On: 03/10/2019 9:45 AM Estimated Blood Loss: Estimated blood loss: none.      [facility]

## 2019-03-10 NOTE — Anesthesia Post-op Follow-up Note (Signed)
Anesthesia QCDR form completed.        

## 2019-03-10 NOTE — Op Note (Signed)
[facility]astroenter[facility] Patient Name: Mariah Rogers Procedure Date: 03/10/2019 9:44 AM MRN: [id number] Account #: [id number] Date of Birth: 10-28-1950 Admit Type: Outpatient Age: 68 Room: [facility] ENDO ROOM 2 Gender: Female Note Status: Finalized Procedure:            Colonoscopy Indications:          Screening in patient at increased risk: Family history                        of 1st-degree relative with colorectal cancer before                        age 35 years Providers:            [name] MD, MD Referring MD:         [name] MD, MD (Referring MD) Medicines:            Monitored Anesthesia Care Complications:        No immediate complications. Procedure:            Pre-Anesthesia Assessment:                       - ASA Grade Assessment: II - A patient with mild                        systemic disease.                       - Prior to the procedure, a History and Physical was                        performed, and patient medications, allergies and                        sensitivities were reviewed. The patient's tolerance of                        previous anesthesia was reviewed.                       - The risks and benefits of the procedure and the                        sedation options and risks were discussed with the                        patient. All questions were answered and informed                        consent was obtained.                       - Patient identification and proposed procedure were                        verified prior to the procedure by the physician, the                        nurse, the anesthesiologist, the anesthetist and the  technician. The procedure was verified in the procedure                        room.                       After obtaining informed consent, the colonoscope was                        passed under direct vision. Throughout the procedure,     the patient's blood pressure, pulse, and oxygen                        saturations were monitored continuously. The                        Colonoscope was introduced through the anus and                        advanced to the the cecum, identified by appendiceal                        orifice and ileocecal valve. The colonoscopy was                        performed with ease. The patient tolerated the                        procedure well. The quality of the bowel preparation                        was fair. Findings:      The perianal and digital rectal examinations were normal.      Two sessile polyps were found in the cecum. The polyps were 3 to 4 mm in       size. These polyps were removed with a cold biopsy forceps. Resection       and retrieval were complete.      A 6 mm polyp was found in the descending colon. The polyp was sessile.       The polyp was removed with a cold snare. Resection and retrieval were       complete.      The exam was otherwise without abnormality.      The rectum, sigmoid colon, descending colon, transverse colon, ascending       colon and cecum appeared normal.      The retroflexed view of the distal rectum and anal verge was normal and       showed no anal or rectal abnormalities. Impression:           - Preparation of the colon was fair.                       - Two 3 to 4 mm polyps in the cecum, removed with a                        cold biopsy forceps. Resected and retrieved.                       - One 6 mm polyp in the descending colon, removed with  a cold snare. Resected and retrieved.                       - The examination was otherwise normal.                       - The rectum, sigmoid colon, descending colon,                        transverse colon, ascending colon and cecum are normal.                       - The distal rectum and anal verge are normal on                        retroflexion view. Recommendation:        - Discharge patient to home (with escort).                       - Advance diet as tolerated.                       - Continue present medications.                       - Await pathology results.                       - Repeat colonoscopy in 1 year, with 2 day prep.                       - The findings and recommendations were discussed with                        the patient.                       - The findings and recommendations were discussed with                        the patient's family.                       - Return to primary care physician as previously                        scheduled. Procedure Code(s):    --- Professional ---                       [phone], Colonoscopy, flexible; with removal of tumor(s),                        polyp(s), or other lesion(s) by snare technique                       [id number], 86, Colonoscopy, flexible; with biopsy, single                        or multiple Diagnosis Code(s):    --- Professional ---                       K63.5, Polyp of colon  Z80.0, Family history of malignant neoplasm of                        digestive organs CPT copyright 2019 American Medical Association. All rights reserved. The codes documented in this report are preliminary and upon coder review may  be revised to meet current compliance requirements.  [name], MD [name] MD, MD 03/10/2019 10:42:26 AM This report has been signed electronically. Number of Addenda: 0 Note Initiated On: 03/10/2019 9:44 AM Scope Withdrawal Time: 0 hours 22 minutes 22 seconds  Total Procedure Duration: 0 hours 28 minutes 34 seconds  Estimated Blood Loss: Estimated blood loss: none.      [facility]

## 2019-03-10 NOTE — Transfer of Care (Signed)
Immediate Anesthesia Transfer of Care Note  Patient: Corynn P Prien  Procedure(s) Performed: COLONOSCOPY WITH PROPOFOL (N/A ) ESOPHAGOGASTRODUODENOSCOPY (EGD) WITH PROPOFOL (N/A )  Patient Location: PACU and Endoscopy Unit  Anesthesia Type:General  Level of Consciousness: drowsy  Airway & Oxygen Therapy: Patient Spontanous Breathing  Post-op Assessment: Report given to RN and Post -op Vital signs reviewed and stable  Post vital signs: Reviewed and stable  Last Vitals:  Vitals Value Taken Time  BP 116/59 03/10/19 1042  Temp 36.7 C 03/10/19 1040  Pulse 69 03/10/19 1043  Resp 19 03/10/19 1043  SpO2 96 % 03/10/19 1043  Vitals shown include unvalidated device data.  Last Pain:  Vitals:   03/10/19 1040  TempSrc: Oral  PainSc:          Complications: No apparent anesthesia complications

## 2019-03-10 NOTE — Anesthesia Preprocedure Evaluation (Signed)
Anesthesia Evaluation  Patient identified by MRN, date of birth, ID band Patient awake    Reviewed: Allergy & Precautions, H&P , NPO status , Patient's Chart, lab work & pertinent test results, reviewed documented beta blocker date and time   Airway Mallampati: II   Neck ROM: full    Dental  (+) Poor Dentition   Pulmonary neg pulmonary ROS, COPD,  COPD inhaler, Current SmokerPatient did not abstain from smoking.,    Pulmonary exam normal        Cardiovascular Exercise Tolerance: Poor hypertension, On Medications + CAD and + Peripheral Vascular Disease  negative cardio ROS Normal cardiovascular exam Rhythm:regular Rate:Normal     Neuro/Psych negative neurological ROS  negative psych ROS   GI/Hepatic negative GI ROS, Neg liver ROS, hiatal hernia, GERD  Medicated,  Endo/Other  negative endocrine ROS  Renal/GU negative Renal ROS  negative genitourinary   Musculoskeletal   Abdominal   Peds  Hematology negative hematology ROS (+)   Anesthesia Other Findings Past Medical History: No date: CAD (coronary artery disease) No date: COPD (chronic obstructive pulmonary disease) (HCC) No date: GERD (gastroesophageal reflux disease) No date: Hernia, hiatal No date: Hyperlipidemia No date: Hypertension No date: Peripheral vascular disease (HCC) No date: Thrombosis     Comment:  in Lt leg Past Surgical History: No date: ABDOMINAL AORTA STENT No date: ABDOMINAL HYSTERECTOMY 2005: aortobifemoral bypass No date: CESAREAN SECTION     Comment:  x 3 No date: femoral stents 10/02/2016: LOWER EXTREMITY ANGIOGRAPHY; Left     Comment:  Procedure: Lower Extremity Angiography;  Surgeon: Algernon Huxley, MD;  Location: [facility];  Service:               Cardiovascular;  Laterality: Left; 10/03/2016: LOWER EXTREMITY ANGIOGRAPHY; N/A     Comment:  Procedure: Lower Extremity Angiography with possible   revascularization;  Surgeon: Algernon Huxley, MD;  Location:               [facility];  Service: Cardiovascular;                Laterality: N/A; BMI    Body Mass Index: 28.35 kg/m     Reproductive/Obstetrics negative OB ROS                             Anesthesia Physical Anesthesia Plan  ASA: III  Anesthesia Plan: General   Post-op Pain Management:    Induction:   PONV Risk Score and Plan:   Airway Management Planned:   Additional Equipment:   Intra-op Plan:   Post-operative Plan:   Informed Consent: I have reviewed the patients History and Physical, chart, labs and discussed the procedure including the risks, benefits and alternatives for the proposed anesthesia with the patient or authorized representative who has indicated his/her understanding and acceptance.     Dental Advisory Given  Plan Discussed with: CRNA  Anesthesia Plan Comments:         Anesthesia Quick Evaluation

## 2019-03-10 NOTE — H&P (Signed)
[name], MD [address] [place], [place], [place], [address] [address], [place], [place], [place], [address] Phone: [phone]  Fax: [phone]  Primary Care Physician:  Letta Median, MD   Pre-Procedure History & Physical: HPI:  NOBIA WOLTHUIS is a 68 y.o. female is here for a colonoscopy and EGD.   Past Medical History:  Diagnosis Date  . CAD (coronary artery disease)   . COPD (chronic obstructive pulmonary disease) ([facility])   . GERD (gastroesophageal reflux disease)   . Hernia, hiatal   . Hyperlipidemia   . Hypertension   . Peripheral vascular disease ([facility])   . Thrombosis    in Lt leg    Past Surgical History:  Procedure Laterality Date  . ABDOMINAL AORTA STENT    . ABDOMINAL HYSTERECTOMY    . aortobifemoral bypass  2005  . CESAREAN SECTION     x 3  . femoral stents    . LOWER EXTREMITY ANGIOGRAPHY Left 10/02/2016   Procedure: Lower Extremity Angiography;  Surgeon: Algernon Huxley, MD;  Location: [facility];  Service: Cardiovascular;  Laterality: Left;  . LOWER EXTREMITY ANGIOGRAPHY N/A 10/03/2016   Procedure: Lower Extremity Angiography with possible revascularization;  Surgeon: Algernon Huxley, MD;  Location: [facility];  Service: Cardiovascular;  Laterality: N/A;    Prior to Admission medications   Medication Sig Start Date End Date Taking? Authorizing Provider  albuterol (PROAIR HFA) 108 (90 Base) MCG/ACT inhaler ProAir HFA 90 mcg/actuation aerosol inhaler 11/04/16  Yes [provider]  apixaban (ELIQUIS) 5 MG TABS tablet Take 1 tablet (5 mg total) by mouth 2 (two) times daily. 04/14/18   Algernon Huxley, MD  aspirin EC 81 MG EC tablet Take 1 tablet (81 mg total) by mouth daily. 10/05/16   Stegmayer, Joelene Millin A, PA-C  bisacodyl (DULCOLAX) 5 MG EC tablet Please take 2 tablets (10mg ) once between 1PM and 3pm before your procedure Patient not taking: Reported on 03/10/2019 02/25/19   Virgel Manifold, MD  buPROPion  (WELLBUTRIN SR) 150 MG 12 hr tablet bupropion HCl SR 150 mg tablet,12 hr sustained-release    [provider]  cholecalciferol (VITAMIN D) 1000 units tablet Take 1,000 Units by mouth daily.    [provider]  dicyclomine (BENTYL) 10 MG capsule dicyclomine 10 mg capsule    [provider]  ezetimibe-simvastatin (VYTORIN) 10-40 MG tablet Take 1 tablet by mouth daily.    [provider]  famotidine (PEPCID) 20 MG tablet Take 1 tablet (20 mg total) by mouth daily. 02/25/19 03/27/19  Virgel Manifold, MD  Fluticasone-Salmeterol (ADVAIR) 250-50 MCG/DOSE AEPB fluticasone 250 mcg-salmeterol 50 mcg/dose blistr powdr for inhalation    [provider]  lisinopril-hydrochlorothiazide (PRINZIDE,ZESTORETIC) 20-12.5 MG tablet Take 1 tablet by mouth daily.    [provider]  meloxicam (MOBIC) 15 MG tablet meloxicam 15 mg tablet    [provider]  metFORMIN (GLUCOPHAGE-XR) 500 MG 24 hr tablet  12/31/18   [provider]  nortriptyline (PAMELOR) 10 MG capsule  01/04/19   [provider]  sertraline (ZOLOFT) 100 MG tablet sertraline 100 mg tablet    [provider]    Allergies as of 02/25/2019 - Review Complete 02/25/2019  Allergen Reaction Noted  . Codeine Nausea And Vomiting 12/23/2012    Family History  Problem Relation Age of Onset  . Osteoporosis Mother   . Alzheimer's disease Mother     Social History   Socioeconomic History  .  Marital status: Widowed    Spouse name: Not on file  . Number of children: Not on file  . Years of education: Not on file  . Highest education level: Not on file  Occupational History  . Not on file  Social Needs  . Financial resource strain: Not on file  . Food insecurity    Worry: Not on file    Inability: Not on file  . Transportation needs    Medical: Not on file    Non-medical: Not on file  Tobacco Use  . Smoking status: Current Every Day Smoker    Packs/day: 0.50     Years: 60.00    Pack years: 30.00    Types: Cigarettes  . Smokeless tobacco: Never Used  Substance and Sexual Activity  . Alcohol use: No  . Drug use: Yes    Types: Marijuana    Comment: Former marijuana use  . Sexual activity: Never  Lifestyle  . Physical activity    Days per week: Not on file    Minutes per session: Not on file  . Stress: Not on file  Relationships  . Social Herbalist on phone: Not on file    Gets together: Not on file    Attends religious service: Not on file    Active member of club or organization: Not on file    Attends meetings of clubs or organizations: Not on file    Relationship status: Not on file  . Intimate partner violence    Fear of current or ex partner: Not on file    Emotionally abused: Not on file    Physically abused: Not on file    Forced sexual activity: Not on file  Other Topics Concern  . Not on file  Social History Narrative  . Not on file    Review of Systems: See HPI, otherwise negative ROS  Physical Exam: BP 135/73   Pulse 78   Temp 98.1 F (36.7 C) (Oral)   Resp 16   Ht 5\' 2"  (1.575 m)   Wt 70.3 kg   SpO2 100%   BMI 28.35 kg/m  General:   Alert,  pleasant and cooperative in NAD Head:  Normocephalic and atraumatic. Neck:  Supple; no masses or thyromegaly. Lungs:  Clear throughout to auscultation, normal respiratory effort.    Heart:  +S1, +S2, Regular rate and rhythm, No edema. Abdomen:  Soft, nontender and nondistended. Normal bowel sounds, without guarding, and without rebound.   Neurologic:  Alert and  oriented x4;  grossly normal neurologically.  Impression/Plan: Rupert Stacks is here for a colonoscopy to be performed for high risk screening,family history of colon cancer and EGD for Acid Reflux.  Risks, benefits, limitations, and alternatives regarding the procedures have been reviewed with the patient.  Questions have been answered.  All parties agreeable.   Virgel Manifold, MD   03/10/2019, 9:42 AM

## 2019-03-11 ENCOUNTER — Encounter: Payer: Self-pay | Admitting: Gastroenterology

## 2019-03-12 LAB — SURGICAL PATHOLOGY

## 2019-03-23 NOTE — Anesthesia Postprocedure Evaluation (Signed)
Anesthesia Post Note  Patient: Mariah Rogers  Procedure(s) Performed: COLONOSCOPY WITH PROPOFOL (N/A ) ESOPHAGOGASTRODUODENOSCOPY (EGD) WITH PROPOFOL (N/A )  Patient location during evaluation: PACU Anesthesia Type: General Level of consciousness: awake and alert Pain management: pain level controlled Vital Signs Assessment: post-procedure vital signs reviewed and stable Respiratory status: spontaneous breathing, nonlabored ventilation, respiratory function stable and patient connected to nasal cannula oxygen Cardiovascular status: blood pressure returned to baseline and stable Postop Assessment: no apparent nausea or vomiting Anesthetic complications: no     Last Vitals:  Vitals:   03/10/19 1100 03/10/19 1110  BP: (!) 126/55 126/64  Pulse: 67 67  Resp: (!) 21 13  Temp:    SpO2: 97% 97%    Last Pain:  Vitals:   03/10/19 1040  TempSrc: Oral  PainSc:                  Molli Barrows

## 2019-04-05 ENCOUNTER — Encounter: Payer: Self-pay | Admitting: Gastroenterology

## 2019-04-14 ENCOUNTER — Ambulatory Visit: Payer: Medicare Other | Admitting: Gastroenterology

## 2019-06-09 ENCOUNTER — Encounter: Payer: Self-pay | Admitting: Gastroenterology

## 2019-06-09 ENCOUNTER — Ambulatory Visit: Payer: Medicare Other | Admitting: Gastroenterology

## 2019-06-09 ENCOUNTER — Other Ambulatory Visit: Payer: Self-pay

## 2019-06-09 DIAGNOSIS — R1013 Epigastric pain: Secondary | ICD-10-CM

## 2019-10-07 ENCOUNTER — Other Ambulatory Visit: Payer: Self-pay | Admitting: Family Medicine

## 2019-10-07 DIAGNOSIS — Z1231 Encounter for screening mammogram for malignant neoplasm of breast: Secondary | ICD-10-CM

## 2019-10-07 DIAGNOSIS — Z0000 Encounter for general adult medical examination without abnormal findings: Secondary | ICD-10-CM

## 2019-11-10 ENCOUNTER — Telehealth: Payer: Self-pay

## 2019-11-10 DIAGNOSIS — Z87891 Personal history of nicotine dependence: Secondary | ICD-10-CM

## 2019-11-10 DIAGNOSIS — Z122 Encounter for screening for malignant neoplasm of respiratory organs: Secondary | ICD-10-CM

## 2019-11-10 NOTE — Telephone Encounter (Signed)
Patient has been notified that the low dose lung cancer screening CT scan is due currently or will be in near future.  Confirmed that patient is within the appropriate age range and asymptomatic, (no signs or symptoms of lung cancer).  Patient denies illness that would prevent curative treatment for lung cancer if found.  Patient is agreeable for CT scan being scheduled.     Verified smoking history (current smoker, with 61 year 0.05 ppd history).   CT scan scheduled for 11/24/19 @ 9:30

## 2019-11-11 NOTE — Telephone Encounter (Signed)
Smoking history: current, 61.5 pack year

## 2019-11-11 NOTE — Addendum Note (Signed)
Addended by: Lieutenant Diego on: 11/11/2019 10:45 AM   Modules accepted: Orders

## 2019-11-24 ENCOUNTER — Ambulatory Visit
Admission: RE | Admit: 2019-11-24 | Discharge: 2019-11-24 | Disposition: A | Discharge status: Home / Self Care | Payer: Medicare Other | Source: Ambulatory Visit | Attending: Oncology | Admitting: Oncology

## 2019-11-24 ENCOUNTER — Other Ambulatory Visit: Payer: Self-pay

## 2019-11-24 DIAGNOSIS — Z122 Encounter for screening for malignant neoplasm of respiratory organs: Secondary | ICD-10-CM | POA: Insufficient documentation

## 2019-11-24 DIAGNOSIS — Z87891 Personal history of nicotine dependence: Secondary | ICD-10-CM | POA: Diagnosis not present

## 2019-11-29 ENCOUNTER — Encounter: Payer: Self-pay | Admitting: *Deleted

## 2020-07-31 ENCOUNTER — Ambulatory Visit: Payer: Self-pay | Admitting: Surgery

## 2020-07-31 NOTE — H&P (Signed)
Subjective:  CC: Grade IV hemorrhoids [K64.3]   HPI:  Mariah Rogers is a 70 y.o. female who was referrred by [facility] * for above. Symptoms were first noted several weeks ago. she has some rectal bleeding occurring a few times per week. Bleeding is described as just notes blood on tissue paper. Pain is burning, confined to the perianal area, without radiation.  Associated with nothing specific, exacerbated by nothing specific.  Worsening symptoms.    Patient denies a personal history of colon cancer. Patient denies a personal history of IBD. Patient reports to history of polyps.  Last colonoscopy in 2020.  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  Past Surgical History:  has a past surgical history that includes Cesarean section; Hysterectomy; Aorta - femoral artery bypass graft; and Endovascular Transluminal Balloon Angioplasty Femoral/Popliteal Artery (Left).  Family History: family history includes Diabetes type II in her son; Osteoarthritis in her mother.  Social History:  reports that she has been smoking. She has never used smokeless tobacco. She reports that she does not drink alcohol. No history on file for drug use.  Current Medications: has a current medication list which includes the following prescription(s): apixaban, aspirin, cholecalciferol, ezetimibe-simvastatin, hydrocortisone, lisinopril, nortriptyline, omeprazole, proair hfa, sertraline, spiriva with handihaler, fluticasone propion-salmeterol, lidocaine, and  metformin.  Allergies:       Allergies as of 07/31/2020 - Reviewed 03/10/2020  Allergen Reaction Noted  . Codeine Nausea And Vomiting 12/23/2012    ROS:  A 15 point review of systems was performed and pertinent positives and negatives noted in HPI  Objective:   BP 124/66   Pulse 85   Ht 154.9 cm (5\' 1" )   Wt 66.2 kg (146 lb)   BMI 27.59 kg/m   Constitutional :  alert, appears stated age, cooperative and no distress  Lymphatics/Throat::  no asymmetry, masses, or scars  Respiratory:  clear to auscultation bilaterally  Cardiovascular:  regular rate and rhythm  Gastrointestinal: soft, non-tender; bowel sounds normal; no masses,  no organomegaly.    Musculoskeletal: Steady gait and movement  Skin: Cool and mois  Psychiatric: Normal affect, non-agitated, not confused  Genital/Rectal: Chaperone present for exam.  External exam noted to have ulcerated internal hemorrhoids, TTP.  Non-reducible, moderate size.  No obvious thrombosis noted on limited exam. No active bleeding.    LABS:  n/a   RADS: N/a  Assessment:   Grade IV hemorrhoids [K64.3]  Smoking  Plan:  1. Grade IV hemorrhoids [K64.3] Discussed risks/benefits/alternatives to surgery.  Alternatives include the options of observation, medical management.  Benefits include symptomatic relief.  I discussed  in detail and the complications related to the operation and the anesthesia, including bleeding, infection, recurrence, remote possibility of temporary or permanent fecal incontinence, poor/delayed wound healing, chronic pain, and additional procedures to address said risks. The risks of general anesthetic, if used, includes MI, CVA, sudden death or even reaction to anesthetic medications also discussed.   We also discussed typical post operative recovery which includes weeks to potentially months of anal pain, drainage, occasional bleeding, and sense of fecal urgency.    ED return precautions given for sudden  increase in pain, bleeding, with possible accompanying fever, nausea, and/or vomiting.  The patient understands the risks, any and all questions were answered to the patient's satisfaction.  2. Patient has elected to proceed with surgical treatment due to size and acuity of pain.  Procedure will be scheduled. Hold eliquis 3-5days prior to procedure.  Also discussed how current smoking history places her at higher perioperative risk and poor wound healing.  Urged her to continue to cut down on use.  She verbalized understanding.

## 2020-07-31 NOTE — H&P (View-Only) (Signed)
Subjective:  CC: Grade IV hemorrhoids [K64.3]   HPI:  Mariah Rogers is a 70 y.o. female who was referrred by Mariah Rogers * for above. Symptoms were first noted several weeks ago. she has some rectal bleeding occurring a few times per week. Bleeding is described as just notes blood on tissue paper. Pain is burning, confined to the perianal area, without radiation.  Associated with nothing specific, exacerbated by nothing specific.  Worsening symptoms.    Patient denies a personal history of colon cancer. Patient denies a personal history of IBD. Patient reports to history of polyps.  Last colonoscopy in 2020.  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  Past Surgical History:  has a past surgical history that includes Cesarean section; Hysterectomy; Aorta - femoral artery bypass graft; and Endovascular Transluminal Balloon Angioplasty Femoral/Popliteal Artery (Left).  Family History: family history includes Diabetes type II in her son; Osteoarthritis in her mother.  Social History:  reports that she has been smoking. She has never used smokeless tobacco. She reports that she does not drink alcohol. No history on file for drug use.  Current Medications: has a current medication list which includes the following prescription(s): apixaban, aspirin, cholecalciferol, ezetimibe-simvastatin, hydrocortisone, lisinopril, nortriptyline, omeprazole, proair hfa, sertraline, spiriva with handihaler, fluticasone propion-salmeterol, lidocaine, and  metformin.  Allergies:       Allergies as of 07/31/2020 - Reviewed 03/10/2020  Allergen Reaction Noted  . Codeine Nausea And Vomiting 12/23/2012    ROS:  A 15 point review of systems was performed and pertinent positives and negatives noted in HPI  Objective:   BP 124/66   Pulse 85   Ht 154.9 cm (5\' 1" )   Wt 66.2 kg (146 lb)   BMI 27.59 kg/m   Constitutional :  alert, appears stated age, cooperative and no distress  Lymphatics/Throat::  no asymmetry, masses, or scars  Respiratory:  clear to auscultation bilaterally  Cardiovascular:  regular rate and rhythm  Gastrointestinal: soft, non-tender; bowel sounds normal; no masses,  no organomegaly.    Musculoskeletal: Steady gait and movement  Skin: Cool and mois  Psychiatric: Normal affect, non-agitated, not confused  Genital/Rectal: Chaperone present for exam.  External exam noted to have ulcerated internal hemorrhoids, TTP.  Non-reducible, moderate size.  No obvious thrombosis noted on limited exam. No active bleeding.    LABS:  n/a   RADS: N/a  Assessment:   Grade IV hemorrhoids [K64.3]  Smoking  Plan:  1. Grade IV hemorrhoids [K64.3] Discussed risks/benefits/alternatives to surgery.  Alternatives include the options of observation, medical management.  Benefits include symptomatic relief.  I discussed  in detail and the complications related to the operation and the anesthesia, including bleeding, infection, recurrence, remote possibility of temporary or permanent fecal incontinence, poor/delayed wound healing, chronic pain, and additional procedures to address said risks. The risks of general anesthetic, if used, includes MI, CVA, sudden death or even reaction to anesthetic medications also discussed.   We also discussed typical post operative recovery which includes weeks to potentially months of anal pain, drainage, occasional bleeding, and sense of fecal urgency.    ED return precautions given for sudden  increase in pain, bleeding, with possible accompanying fever, nausea, and/or vomiting.  The patient understands the risks, any and all questions were answered to the patient's satisfaction.  2. Patient has elected to proceed with surgical treatment due to size and acuity of pain.  Procedure will be scheduled. Hold eliquis 3-5days prior to procedure.  Also discussed how current smoking history places her at higher perioperative risk and poor wound healing.  Urged her to continue to cut down on use.  She verbalized understanding.

## 2020-08-04 ENCOUNTER — Other Ambulatory Visit: Payer: Self-pay

## 2020-08-04 ENCOUNTER — Other Ambulatory Visit
Admission: RE | Admit: 2020-08-04 | Discharge: 2020-08-04 | Disposition: A | Discharge status: Home / Self Care | Payer: Medicare Other | Source: Ambulatory Visit | Attending: Surgery | Admitting: Surgery

## 2020-08-04 HISTORY — DX: Depression, unspecified: F32.A

## 2020-08-04 HISTORY — DX: Unspecified osteoarthritis, unspecified site: M19.90

## 2020-08-04 HISTORY — DX: Prediabetes: R73.03

## 2020-08-04 NOTE — Patient Instructions (Signed)
Your procedure is scheduled on: Thursday August 10, 2020. Report to Day Surgery inside [facility] 2nd floor (stop by Admissions desk first before getting on Elevator). To find out your arrival time please call [phone] between 1PM - 3PM on Wednesday August 09, 2020.  Remember: Instructions that are not followed completely may result in serious medical risk,  up to and including death, or upon the discretion of your surgeon and anesthesiologist your  surgery may need to be rescheduled.     _X__ 1. Do not eat food after midnight the night before your procedure.                 No chewing gum or hard candies. You may drink clear liquids up to 2 hours                 before you are scheduled to arrive for your surgery- DO not drink clear                 liquids within 2 hours of the start of your surgery.                 Clear Liquids include:  water, apple juice without pulp, clear Gatorade, G2 or                  Gatorade Zero (avoid Red/Purple/Blue), Black Coffee or Tea (Do not add                 anything to coffee or tea).  __X__2.  On the morning of surgery brush your teeth with toothpaste and water, you                may rinse your mouth with mouthwash if you wish.  Do not swallow any toothpaste of mouthwash.     _X__ 3.  No Alcohol for 24 hours before or after surgery.   _X__ 4.  Do Not Smoke or use e-cigarettes For 24 Hours Prior to Your Surgery.                 Do not use any chewable tobacco products for at least 6 hours prior to                 Surgery.  _X__  5.  Do not use any recreational drugs (marijuana, cocaine, heroin, ecstasy, MDMA or other)                For at least one week prior to your surgery.  Combination of these drugs with anesthesia                May have life threatening results.   _X___ 6.  Notify your doctor if there is any change in your medical condition      (cold, fever, infections).     Do not wear jewelry,  make-up, hairpins, clips or nail polish. Do not wear lotions, powders, or perfumes. You may wear deodorant. Do not shave 48 hours prior to surgery. Men may shave face and neck. Do not bring valuables to the hospital.    [facility] is not responsible for any belongings or valuables.  Contacts, dentures or bridgework may not be worn into surgery. Leave your suitcase in the car. After surgery it may be brought to your room. For patients admitted to the hospital, discharge time is determined by your treatment team.   Patients discharged the day of surgery will not be allowed to  drive home.   Make arrangements for someone to be with you for the first 24 hours of your Same Day Discharge.   __x__ Take these medicines the morning of surgery with A SIP OF WATER:    1. omeprazole (PRILOSEC) 20 MG  2. nortriptyline (PAMELOR) 10 MG   3. sertraline (ZOLOFT) 100 MG   ____ Fleet Enema (as directed)   __x__ Use CHG Soap (or wipes) as directed  ____ Use Benzoyl Peroxide Gel as instructed  __x__ Use inhalers on the day of surgery  albuterol (VENTOLIN HFA) 108 (90 Base) MCG/ACT inhaler  Fluticasone-Salmeterol (ADVAIR) 250-50 MCG/DOSE AEPB  ____ Stop metformin 2 days prior to surgery    ____ Take 1/2 of usual insulin dose the night before surgery. No insulin the morning          of surgery.   __X__ Stop apixaban (ELIQUIS) 5 MG and aspirin EC 81 MG as instructed by your doctor.  __X__ Stop Anti-inflammatories such as Ibuprofen, Aleve, Advil, naproxen and or BC powders.    __X__ Stop supplements until after surgery.    __X__ Do not start any herbal supplements before your procedure.     If you have any questions regarding your pre-procedure instructions,  Please call Pre-admit Testing at [phone].

## 2020-08-08 ENCOUNTER — Encounter
Admission: RE | Admit: 2020-08-08 | Discharge: 2020-08-08 | Disposition: A | Discharge status: Home / Self Care | Payer: Medicare Other | Source: Ambulatory Visit | Attending: Surgery | Admitting: Surgery

## 2020-08-08 ENCOUNTER — Other Ambulatory Visit: Payer: Self-pay

## 2020-08-08 ENCOUNTER — Other Ambulatory Visit: Payer: Medicare Other

## 2020-08-08 DIAGNOSIS — I10 Essential (primary) hypertension: Secondary | ICD-10-CM | POA: Insufficient documentation

## 2020-08-08 DIAGNOSIS — Z01810 Encounter for preprocedural cardiovascular examination: Secondary | ICD-10-CM | POA: Diagnosis not present

## 2020-08-08 DIAGNOSIS — Z01818 Encounter for other preprocedural examination: Secondary | ICD-10-CM | POA: Insufficient documentation

## 2020-08-08 DIAGNOSIS — Z20822 Contact with and (suspected) exposure to covid-19: Secondary | ICD-10-CM | POA: Insufficient documentation

## 2020-08-08 LAB — BASIC METABOLIC PANEL
Anion gap: 7 (ref 5–15)
BUN: 19 mg/dL (ref 8–23)
CO2: 27 mmol/L (ref 22–32)
Calcium: 9.5 mg/dL (ref 8.9–10.3)
Chloride: 103 mmol/L (ref 98–111)
Creatinine, Ser: 0.99 mg/dL (ref 0.44–1.00)
GFR, Estimated: >60 mL/min (ref >60)
Glucose, Bld: 103 mg/dL — ABNORMAL HIGH (ref 70–99)
Potassium: 4.6 mmol/L (ref 3.5–5.1)
Sodium: 137 mmol/L (ref 135–145)

## 2020-08-08 LAB — CBC
HCT: 37 % (ref 36.0–46.0)
Hemoglobin: 12.2 g/dL (ref 12.0–15.0)
MCH: 27.1 pg (ref 26.0–34.0)
MCHC: 33 g/dL (ref 30.0–36.0)
MCV: 82.2 fL (ref 80.0–100.0)
Platelets: 251 10*3/uL (ref 150–400)
RBC: 4.5 MIL/uL (ref 3.87–5.11)
RDW: 16.7 % — ABNORMAL HIGH (ref 11.5–15.5)
WBC: 11.3 10*3/uL — ABNORMAL HIGH (ref 4.0–10.5)
nRBC: 0 % (ref 0.0–0.2)

## 2020-08-08 LAB — SARS CORONAVIRUS 2 (TAT 6-24 HRS): SARS Coronavirus 2: NEGATIVE

## 2020-08-10 ENCOUNTER — Ambulatory Visit: Payer: Medicare Other | Admitting: Urgent Care

## 2020-08-10 ENCOUNTER — Other Ambulatory Visit: Payer: Self-pay

## 2020-08-10 ENCOUNTER — Encounter
Admission: RE | Disposition: A | Discharge status: Home / Self Care | Payer: Self-pay | Source: Home / Self Care | Attending: Surgery

## 2020-08-10 ENCOUNTER — Encounter: Payer: Self-pay | Admitting: Surgery

## 2020-08-10 ENCOUNTER — Ambulatory Visit
Admission: RE | Admit: 2020-08-10 | Discharge: 2020-08-10 | Disposition: A | Discharge status: Home / Self Care | Payer: Medicare Other | Attending: Surgery | Admitting: Surgery

## 2020-08-10 DIAGNOSIS — K643 Fourth degree hemorrhoids: Secondary | ICD-10-CM | POA: Diagnosis present

## 2020-08-10 DIAGNOSIS — Z7901 Long term (current) use of anticoagulants: Secondary | ICD-10-CM | POA: Insufficient documentation

## 2020-08-10 DIAGNOSIS — C210 Malignant neoplasm of anus, unspecified: Secondary | ICD-10-CM | POA: Insufficient documentation

## 2020-08-10 DIAGNOSIS — Z86010 Personal history of colonic polyps: Secondary | ICD-10-CM | POA: Insufficient documentation

## 2020-08-10 DIAGNOSIS — Z7982 Long term (current) use of aspirin: Secondary | ICD-10-CM | POA: Insufficient documentation

## 2020-08-10 DIAGNOSIS — F17200 Nicotine dependence, unspecified, uncomplicated: Secondary | ICD-10-CM | POA: Insufficient documentation

## 2020-08-10 DIAGNOSIS — Z79899 Other long term (current) drug therapy: Secondary | ICD-10-CM | POA: Insufficient documentation

## 2020-08-10 DIAGNOSIS — Z7984 Long term (current) use of oral hypoglycemic drugs: Secondary | ICD-10-CM | POA: Insufficient documentation

## 2020-08-10 DIAGNOSIS — K6289 Other specified diseases of anus and rectum: Secondary | ICD-10-CM

## 2020-08-10 HISTORY — PX: EVALUATION UNDER ANESTHESIA WITH HEMORRHOIDECTOMY: SHX5624

## 2020-08-10 HISTORY — DX: Emphysema, unspecified: J43.9

## 2020-08-10 LAB — URINE DRUG SCREEN, QUALITATIVE (ARMC ONLY)
Amphetamines, Ur Screen: NOT DETECTED
Barbiturates, Ur Screen: NOT DETECTED
Benzodiazepine, Ur Scrn: NOT DETECTED
Cannabinoid 50 Ng, Ur ~~LOC~~: POSITIVE — AB
Cocaine Metabolite,Ur ~~LOC~~: NOT DETECTED
MDMA (Ecstasy)Ur Screen: NOT DETECTED
Methadone Scn, Ur: NOT DETECTED
Opiate, Ur Screen: NOT DETECTED
Phencyclidine (PCP) Ur S: NOT DETECTED
Tricyclic, Ur Screen: POSITIVE — AB

## 2020-08-10 SURGERY — EXAM UNDER ANESTHESIA WITH HEMORRHOIDECTOMY
Anesthesia: General | Site: Anus

## 2020-08-10 MED ORDER — ACETAMINOPHEN 500 MG PO TABS
ORAL_TABLET | ORAL | Status: AC
  Filled 2020-08-10: qty 2

## 2020-08-10 MED ORDER — LACTATED RINGERS IV SOLN: INTRAVENOUS | Status: DC

## 2020-08-10 MED ORDER — BUPIVACAINE-EPINEPHRINE (PF) 0.5% -1:200000 IJ SOLN
INTRAMUSCULAR | Status: DC | PRN
  Administered 2020-08-10: 17 mL

## 2020-08-10 MED ORDER — PROPOFOL 10 MG/ML IV BOLUS
INTRAVENOUS | Status: AC
  Filled 2020-08-10: qty 20

## 2020-08-10 MED ORDER — GABAPENTIN 300 MG PO CAPS
ORAL_CAPSULE | ORAL | Status: AC
  Filled 2020-08-10: qty 1

## 2020-08-10 MED ORDER — ONDANSETRON HCL 4 MG/2ML IJ SOLN
INTRAMUSCULAR | Status: DC | PRN
  Administered 2020-08-10: 4 mg via INTRAVENOUS

## 2020-08-10 MED ORDER — GLYCOPYRROLATE 0.2 MG/ML IJ SOLN
INTRAMUSCULAR | Status: DC | PRN
  Administered 2020-08-10: .2 mg via INTRAVENOUS

## 2020-08-10 MED ORDER — GABAPENTIN 300 MG PO CAPS
300.0000 mg | ORAL_CAPSULE | ORAL | Status: AC
  Administered 2020-08-10: 300 mg via ORAL

## 2020-08-10 MED ORDER — CHLORHEXIDINE GLUCONATE 0.12 % MT SOLN
15.0000 mL | Freq: Once | OROMUCOSAL | Status: AC
  Administered 2020-08-10: 15 mL via OROMUCOSAL

## 2020-08-10 MED ORDER — EPHEDRINE SULFATE 50 MG/ML IJ SOLN
INTRAMUSCULAR | Status: DC | PRN
  Administered 2020-08-10: 25 mg via INTRAVENOUS
  Administered 2020-08-10: 15 mg via INTRAVENOUS
  Administered 2020-08-10: 10 mg via INTRAVENOUS

## 2020-08-10 MED ORDER — ACETAMINOPHEN 500 MG PO TABS
1000.0000 mg | ORAL_TABLET | ORAL | Status: AC
  Administered 2020-08-10: 1000 mg via ORAL

## 2020-08-10 MED ORDER — PROPOFOL 10 MG/ML IV BOLUS
INTRAVENOUS | Status: DC | PRN
  Administered 2020-08-10: 120 mg via INTRAVENOUS
  Administered 2020-08-10: 20 mg via INTRAVENOUS

## 2020-08-10 MED ORDER — CHLORHEXIDINE GLUCONATE CLOTH 2 % EX PADS
6.0000 | MEDICATED_PAD | Freq: Once | CUTANEOUS | Status: AC
  Administered 2020-08-10: 6 via TOPICAL

## 2020-08-10 MED ORDER — LIDOCAINE HCL (PF) 2 % IJ SOLN
INTRAMUSCULAR | Status: DC | PRN
  Administered 2020-08-10: 50 mg

## 2020-08-10 MED ORDER — TRAMADOL HCL 50 MG PO TABS: 50.0000 mg | ORAL_TABLET | Freq: Four times a day (QID) | ORAL | 0 refills | Status: DC | PRN

## 2020-08-10 MED ORDER — ACETAMINOPHEN 325 MG PO TABS: 650.0000 mg | ORAL_TABLET | Freq: Three times a day (TID) | ORAL | 0 refills | Status: AC | PRN

## 2020-08-10 MED ORDER — ORAL CARE MOUTH RINSE: 15.0000 mL | Freq: Once | OROMUCOSAL | Status: AC

## 2020-08-10 MED ORDER — FENTANYL CITRATE (PF) 100 MCG/2ML IJ SOLN
INTRAMUSCULAR | Status: AC
  Filled 2020-08-10: qty 2

## 2020-08-10 MED ORDER — DOCUSATE SODIUM 100 MG PO CAPS: 100.0000 mg | ORAL_CAPSULE | Freq: Two times a day (BID) | ORAL | 0 refills | Status: DC | PRN

## 2020-08-10 MED ORDER — DEXAMETHASONE SODIUM PHOSPHATE 10 MG/ML IJ SOLN
INTRAMUSCULAR | Status: DC | PRN
  Administered 2020-08-10: 10 mg via INTRAVENOUS

## 2020-08-10 MED ORDER — MEPERIDINE HCL 50 MG/ML IJ SOLN: 6.2500 mg | INTRAMUSCULAR | Status: DC | PRN

## 2020-08-10 MED ORDER — FENTANYL CITRATE (PF) 100 MCG/2ML IJ SOLN: 25.0000 ug | INTRAMUSCULAR | Status: DC | PRN

## 2020-08-10 MED ORDER — ONDANSETRON HCL 4 MG/2ML IJ SOLN: 4.0000 mg | Freq: Once | INTRAMUSCULAR | Status: DC | PRN

## 2020-08-10 MED ORDER — CHLORHEXIDINE GLUCONATE 0.12 % MT SOLN
OROMUCOSAL | Status: AC
  Filled 2020-08-10: qty 15

## 2020-08-10 MED ORDER — IBUPROFEN 400 MG PO TABS: 400.0000 mg | ORAL_TABLET | Freq: Three times a day (TID) | ORAL | 0 refills | Status: DC | PRN

## 2020-08-10 MED ORDER — FENTANYL CITRATE (PF) 100 MCG/2ML IJ SOLN
INTRAMUSCULAR | Status: DC | PRN
  Administered 2020-08-10 (×2): 50 ug via INTRAVENOUS

## 2020-08-10 MED ORDER — GELATIN ABSORBABLE 12-7 MM EX MISC
CUTANEOUS | Status: DC | PRN
  Administered 2020-08-10: 1

## 2020-08-10 MED ORDER — PHENYLEPHRINE HCL (PRESSORS) 10 MG/ML IV SOLN
INTRAVENOUS | Status: DC | PRN
  Administered 2020-08-10: 200 ug via INTRAVENOUS
  Administered 2020-08-10: 100 ug via INTRAVENOUS
  Administered 2020-08-10: 200 ug via INTRAVENOUS
  Administered 2020-08-10: 100 ug via INTRAVENOUS

## 2020-08-10 SURGICAL SUPPLY — 30 items
BLADE SURG 15 STRL LF DISP TIS (BLADE) ×1 IMPLANT
BLADE SURG 15 STRL SS (BLADE) ×2
BRIEF STRETCH MATERNITY 2XLG (MISCELLANEOUS) ×2 IMPLANT
CANISTER SUCT 1200ML W/VALVE (MISCELLANEOUS) ×2 IMPLANT
COVER WAND RF STERILE (DRAPES) ×2 IMPLANT
DRAPE PERI LITHO V/GYN (MISCELLANEOUS) ×2 IMPLANT
DRAPE UNDER BUTTOCK W/FLU (DRAPES) ×2 IMPLANT
DRSG GAUZE FLUFF 36X18 (GAUZE/BANDAGES/DRESSINGS) ×2 IMPLANT
ELECT REM PT RETURN 9FT ADLT (ELECTROSURGICAL) ×2
ELECTRODE REM PT RTRN 9FT ADLT (ELECTROSURGICAL) ×1 IMPLANT
GLOVE SURG SYN 6.5 ES PF (GLOVE) ×2 IMPLANT
GLOVE SURG UNDER POLY LF SZ7 (GLOVE) ×2 IMPLANT
GOWN STRL REUS W/ TWL LRG LVL3 (GOWN DISPOSABLE) ×2 IMPLANT
GOWN STRL REUS W/TWL LRG LVL3 (GOWN DISPOSABLE) ×4
KIT TURNOVER CYSTO (KITS) ×2 IMPLANT
LABEL OR SOLS (LABEL) ×2 IMPLANT
MANIFOLD NEPTUNE II (INSTRUMENTS) ×2 IMPLANT
NEEDLE HYPO 22GX1.5 SAFETY (NEEDLE) ×2 IMPLANT
NEEDLE HYPO 25X1 1.5 SAFETY (NEEDLE) ×2 IMPLANT
NS IRRIG 500ML POUR BTL (IV SOLUTION) ×2 IMPLANT
PACK BASIN MINOR ARMC (MISCELLANEOUS) ×2 IMPLANT
PAD PREP 24X41 OB/GYN DISP (PERSONAL CARE ITEMS) ×2 IMPLANT
SHEARS HARMONIC 9CM CVD (BLADE) ×2 IMPLANT
SOL PREP PVP 2OZ (MISCELLANEOUS) ×2
SOLUTION PREP PVP 2OZ (MISCELLANEOUS) ×1 IMPLANT
SURGILUBE 2OZ TUBE FLIPTOP (MISCELLANEOUS) ×2 IMPLANT
SUT VIC AB 3-0 SH 27 (SUTURE) ×2
SUT VIC AB 3-0 SH 27X BRD (SUTURE) ×1 IMPLANT
SYR 10ML LL (SYRINGE) ×2 IMPLANT
SYR 20ML LL LF (SYRINGE) ×2 IMPLANT

## 2020-08-10 NOTE — Interval H&P Note (Signed)
History and Physical Interval Note:  08/10/2020 10:05 AM  Mariah Rogers  has presented today for surgery, with the diagnosis of K64.3  Grade IV hemorrhoids.  The various methods of treatment have been discussed with the patient and family. After consideration of risks, benefits and other options for treatment, the patient has consented to  Procedure(s): EXAM UNDER ANESTHESIA WITH HEMORRHOIDECTOMY (N/A) as a surgical intervention.  The patient's history has been reviewed, patient examined, no change in status, stable for surgery.  I have reviewed the patient's chart and labs.  Questions were answered to the patient's satisfaction.     Jeffree Cazeau Lysle Pearl

## 2020-08-10 NOTE — Anesthesia Preprocedure Evaluation (Signed)
Anesthesia Evaluation  Patient identified by MRN, date of birth, ID band Patient awake    Reviewed: Allergy & Precautions, H&P , NPO status , Patient's Chart, lab work & pertinent test results, reviewed documented beta blocker date and time   Airway Mallampati: II  TM Distance: >3 FB Neck ROM: Full    Dental  (+) Edentulous Upper, Edentulous Lower   Pulmonary neg pulmonary ROS, COPD,  COPD inhaler, Current SmokerPatient did not abstain from smoking.,    Pulmonary exam normal        Cardiovascular Exercise Tolerance: Poor hypertension, On Medications + CAD and + Peripheral Vascular Disease  negative cardio ROS Normal cardiovascular exam Rhythm:regular Rate:Normal     Neuro/Psych PSYCHIATRIC DISORDERS Depression negative neurological ROS     GI/Hepatic negative GI ROS, Neg liver ROS, hiatal hernia, GERD  Medicated,  Endo/Other  negative endocrine ROS  Renal/GU negative Renal ROS  negative genitourinary   Musculoskeletal  (+) Arthritis , Osteoarthritis,    Abdominal   Peds  Hematology negative hematology ROS (+)   Anesthesia Other Findings CAD COPD GERD Hernia, hiatal Hyperlipidemia Hypertension Peripheral vascular disease Thrombosis     Comment:  in Lt leg [place] ABDOMINAL HYSTERECTOMY 2005: aortobifemoral bypass CESAREAN SECTION femoral stents 10/02/2016: LOWER EXTREMITY ANGIOGRAPHY; Left     Comment:  Procedure: Lower Extremity Angiography;  Surgeon: Algernon Huxley, MD;  Location: [facility];  Service:               Cardiovascular;  Laterality: Left; 10/03/2016: LOWER EXTREMITY ANGIOGRAPHY; N/A     Comment:  Procedure: Lower Extremity Angiography with possible               revascularization;  Surgeon: Algernon Huxley, MD;  Location:               [facility];  Service: Cardiovascular;                Laterality: N/A; BMI    Body Mass Index: 28.35 kg/m      Reproductive/Obstetrics negative OB ROS                            Anesthesia Physical  Anesthesia Plan  ASA: III  Anesthesia Plan: General   Post-op Pain Management:    Induction: Intravenous  PONV Risk Score and Plan: 2 and Ondansetron, Dexamethasone and Midazolam  Airway Management Planned: LMA and Oral ETT  Additional Equipment:   Intra-op Plan:   Post-operative Plan:   Informed Consent: I have reviewed the patients History and Physical, chart, labs and discussed the procedure including the risks, benefits and alternatives for the proposed anesthesia with the patient or authorized representative who has indicated his/her understanding and acceptance.     Dental Advisory Given  Plan Discussed with: CRNA, Anesthesiologist and Surgeon  Anesthesia Plan Comments:        Anesthesia Quick Evaluation

## 2020-08-10 NOTE — Op Note (Signed)
Preoperative diagnosis: fourth degree hemorrhoids.   Postoperative diagnosis: anal mass  Procedure: exam under anesthesia, incisional biopsy of anal mass  Surgeon: Lysle Pearl  Anesthesia: general  Specimen: anal mass Complications: none  EBL: 31mL  Wound classification: Clean Contaminated  Indications: Patient is a 70 y.o. female was found to have hemorrhoid like tissue in office.  Here for EUA.   Findings: 1.skin tag extending from anal mass at 2 clock position 2. Internal and external anal sphincter palpated and preserved 3. Adequate hemostasis  Description of procedure: The patient was brought to the operating room and general anesthesia was induced. Patient was placed high lithotomy position. A time-out was completed verifying correct patient, procedure, site, positioning, and implant(s) and/or special equipment prior to beginning this procedure. The perineum was prepped and draped in standard sterile fashion.    Previously noted hemorrhoid like tissue present on external exam at 2 o clock position. Upon further palpation, mass like nodule noted to extend from tissue towards external sphincter muscle.  DRE confirmed this mass extended within the rectal vault. Local anesthetic was injected as a perianal block. An anoscope was introduced and confirmed a mass like group of abnormal tissue extending from external skin tissue, approximately 4cm total, with ulceration within tissue.  The tissue seemed to be extending into the sphincter muscles and was not mobile.  Decision was made to proceed with incisional biopsy for definitive diagnosis.  The outer tissue and part of ulcreated tissue was removed tangentially off the sphincter muscle using cautery, passed off field pending pathology.  Bleeding from biopsy site controlled with 4-0 vicryl x2.  Hemostasis confirmed prior to injecting exparel into biopsy area.  surgifoam placed and area dressed with fluffs.  The patient tolerated the procedure  well and was taken to the postanesthesia care unit in stable condition.  Sponge and instrument count correct at end of procedure.

## 2020-08-10 NOTE — Anesthesia Postprocedure Evaluation (Signed)
Anesthesia Post Note  Patient: Mariah Rogers  Procedure(s) Performed: EXAM UNDER ANESTHESIA WITH HEMORRHOIDECTOMY (N/A Anus)  Patient location during evaluation: PACU Anesthesia Type: General Level of consciousness: awake and alert, awake and oriented Pain management: pain level controlled Vital Signs Assessment: post-procedure vital signs reviewed and stable Respiratory status: spontaneous breathing, nonlabored ventilation and respiratory function stable Cardiovascular status: blood pressure returned to baseline and stable Postop Assessment: no apparent nausea or vomiting Anesthetic complications: no   No complications documented.   Last Vitals:  Vitals:   08/10/20 1130 08/10/20 1140  BP: 116/76 124/61  Pulse: 78 79  Resp: 19 16  Temp: 36.7 C 36.7 C  SpO2: 95% 97%    Last Pain:  Vitals:   08/10/20 1140  TempSrc: Temporal  PainSc: 0-No pain                 Phill Mutter

## 2020-08-10 NOTE — Anesthesia Procedure Notes (Signed)
Procedure Name: LMA Insertion Performed by: Nayah Lukens, CRNA Pre-anesthesia Checklist: Patient identified, Patient being monitored, Timeout performed, Emergency Drugs available and Suction available Patient Re-evaluated:Patient Re-evaluated prior to induction Oxygen Delivery Method: Circle system utilized Preoxygenation: Pre-oxygenation with 100% oxygen Induction Type: IV induction Ventilation: Mask ventilation without difficulty LMA: LMA inserted LMA Size: 3.5 Tube type: Oral Number of attempts: 1 Placement Confirmation: positive ETCO2 and breath sounds checked- equal and bilateral Tube secured with: Tape Dental Injury: Teeth and Oropharynx as per pre-operative assessment        

## 2020-08-10 NOTE — Discharge Instructions (Signed)
Removal, Care After This sheet gives you information about how to care for yourself after your procedure. Your health care provider may also give you more specific instructions. If you have problems or questions, contact your health care provider. What can I expect after the procedure? After the procedure, it is common to have:  Soreness.  Bruising.  Itching. Follow these instructions at home: site care Follow instructions from your health care provider about how to take care of your site. Make sure you:  Wash your hands with soap and water before and after you change your bandage (dressing). If soap and water are not available, use hand sanitizer.  Leave stitches (sutures), skin glue, or adhesive strips in place. These skin closures may need to stay in place for 2 weeks or longer. If adhesive strip edges start to loosen and curl up, you may trim the loose edges. Do not remove adhesive strips completely unless your health care provider tells you to do that.  If the area bleeds or bruises, apply gentle pressure for 10 minutes.  OK TO SHOWER IN 24HRS  Check your site every day for signs of infection. Check for:  Redness, swelling, or pain.  Fluid or blood.  Warmth.  Pus or a bad smell.  General instructions  Rest and then return to your normal activities as told by your health care provider. .  tylenol and advil as needed for discomfort.  Please alternate between the two every four hours as needed for pain.   .  Use narcotics, if prescribed, only when tylenol and motrin is not enough to control pain. .  325-650mg  every 8hrs to max of 3000mg /24hrs (including the 325mg  in every norco dose) for the tylenol.   .  Advil up to 400mg  per dose every 8hrs as needed for pain.    Keep all follow-up visits as told by your health care provider. This is important. Contact a health care provider if:  You have redness, swelling, or pain around your site.  You have fluid or blood coming  from your site.  Your site feels warm to the touch.  You have pus or a bad smell coming from your site.  You have a fever.  Your sutures, skin glue, or adhesive strips loosen or come off sooner than expected. Get help right away if:  You have bleeding that does not stop with pressure or a dressing. Summary  After the procedure, it is common to have some soreness, bruising, and itching at the site.  Follow instructions from your health care provider about how to take care of your site.  Check your site every day for signs of infection.  Contact a health care provider if you have redness, swelling, or pain around your site, or your site feels warm to the touch.  Keep all follow-up visits as told by your health care provider. This is important. This information is not intended to replace advice given to you by your health care provider. Make sure you discuss any questions you have with your health care provider. Document Released: 06/30/2015 Document Revised: 12/01/2017 Document Reviewed: 12/01/2017 Elsevier Interactive Patient Education  2019 [place]   1) The drugs that you were given will stay in your system until tomorrow so for the next 24 hours you should not:  A) Drive an automobile B) Make any legal decisions C) Drink any alcoholic beverage   2) You may resume regular meals tomorrow.  Today  it is better to start with liquids and gradually work up to solid foods.  You may eat anything you prefer, but it is better to start with liquids, then soup and crackers, and gradually work up to solid foods.   3) Please notify your doctor immediately if you have any unusual bleeding, trouble breathing, redness and pain at the surgery site, drainage, fever, or pain not relieved by medication.    4) Additional Instructions:    Please contact your physician with any problems or Same Day Surgery at [phone], Monday through  Friday 6 am to 4 pm, or [place] at [facility] number at [phone].

## 2020-08-10 NOTE — Transfer of Care (Signed)
Immediate Anesthesia Transfer of Care Note  Patient: Mariah Rogers  Procedure(s) Performed: EXAM UNDER ANESTHESIA WITH HEMORRHOIDECTOMY (N/A Anus)  Patient Location: PACU  Anesthesia Type:General  Level of Consciousness: sedated  Airway & Oxygen Therapy: Patient Spontanous Breathing and Patient connected to face mask oxygen  Post-op Assessment: Report given to RN and Post -op Vital signs reviewed and stable  Post vital signs: Reviewed  Last Vitals:  Vitals Value Taken Time  BP 128/68 08/10/20 1102  Temp    Pulse 81 08/10/20 1102  Resp 16 08/10/20 1102  SpO2 98 % 08/10/20 1102  Vitals shown include unvalidated device data.  Last Pain:  Vitals:   08/10/20 0848  TempSrc: Temporal  PainSc: 3          Complications: No complications documented.

## 2020-08-11 ENCOUNTER — Other Ambulatory Visit: Payer: Self-pay | Admitting: Anatomic Pathology & Clinical Pathology

## 2020-08-11 LAB — SURGICAL PATHOLOGY

## 2020-08-17 ENCOUNTER — Other Ambulatory Visit: Payer: Medicare Other

## 2020-08-17 NOTE — Progress Notes (Signed)
Tumor Board Documentation  Mariah Rogers was presented by Dr Lysle Pearl at our Tumor Board on 08/17/2020, which included representatives from medical oncology,radiation oncology,internal medicine,navigation,pathology,radiology,surgical,pharmacy,genetics,research,palliative care,pulmonology.  Mariah Rogers currently presents as a new patient,for MDC,for new positive pathology with history of the following treatments: surgical intervention(s).  Additionally, we reviewed previous medical and familial history, history of present illness, and recent lab results along with all available histopathologic and imaging studies. The tumor board considered available treatment options and made the following recommendations: Additional screening,Concurrent chemo-radiation therapy (PET)    The following procedures/referrals were also placed: No orders of the defined types were placed in this encounter.   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  Tumor board is a meeting of clinicians from various specialty areas who evaluate and discuss patients for whom a multidisciplinary approach is being considered. Final determinations in the plan of care are those of the provider(s). The responsibility for follow up of recommendations given during tumor board is that of the provider.   Today's extended care, comprehensive team conference, Mariah Rogers was not present for the discussion and was not examined.   Multidisciplinary Tumor Board is a multidisciplinary case peer review process.  Decisions discussed in the Multidisciplinary Tumor Board reflect the opinions of the specialists present at the conference without having examined the patient.  Ultimately, treatment and diagnostic decisions rest with the primary provider(s) and the patient.

## 2020-08-18 ENCOUNTER — Other Ambulatory Visit: Payer: Self-pay

## 2020-08-18 ENCOUNTER — Inpatient Hospital Stay: Payer: Medicare Other | Attending: Internal Medicine | Admitting: Internal Medicine

## 2020-08-18 ENCOUNTER — Encounter: Payer: Self-pay | Admitting: Internal Medicine

## 2020-08-18 ENCOUNTER — Inpatient Hospital Stay: Payer: Medicare Other

## 2020-08-18 DIAGNOSIS — C211 Malignant neoplasm of anal canal: Secondary | ICD-10-CM | POA: Diagnosis not present

## 2020-08-18 DIAGNOSIS — F17210 Nicotine dependence, cigarettes, uncomplicated: Secondary | ICD-10-CM

## 2020-08-18 DIAGNOSIS — F1290 Cannabis use, unspecified, uncomplicated: Secondary | ICD-10-CM | POA: Diagnosis not present

## 2020-08-18 DIAGNOSIS — Z86718 Personal history of other venous thrombosis and embolism: Secondary | ICD-10-CM | POA: Diagnosis not present

## 2020-08-18 DIAGNOSIS — I739 Peripheral vascular disease, unspecified: Secondary | ICD-10-CM | POA: Insufficient documentation

## 2020-08-18 DIAGNOSIS — J449 Chronic obstructive pulmonary disease, unspecified: Secondary | ICD-10-CM | POA: Insufficient documentation

## 2020-08-18 DIAGNOSIS — Z7901 Long term (current) use of anticoagulants: Secondary | ICD-10-CM | POA: Insufficient documentation

## 2020-08-18 DIAGNOSIS — Z800 Family history of malignant neoplasm of digestive organs: Secondary | ICD-10-CM | POA: Diagnosis not present

## 2020-08-18 DIAGNOSIS — Z90710 Acquired absence of both cervix and uterus: Secondary | ICD-10-CM | POA: Diagnosis not present

## 2020-08-18 LAB — COMPREHENSIVE METABOLIC PANEL
ALT: 17 U/L (ref 0–44)
AST: 21 U/L (ref 15–41)
Albumin: 3.8 g/dL (ref 3.5–5.0)
Alkaline Phosphatase: 77 U/L (ref 38–126)
Anion gap: 13 (ref 5–15)
BUN: 13 mg/dL (ref 8–23)
CO2: 22 mmol/L (ref 22–32)
Calcium: 9.1 mg/dL (ref 8.9–10.3)
Chloride: 101 mmol/L (ref 98–111)
Creatinine, Ser: 1.12 mg/dL — ABNORMAL HIGH (ref 0.44–1.00)
GFR, Estimated: 53 mL/min — ABNORMAL LOW (ref >60)
Glucose, Bld: 127 mg/dL — ABNORMAL HIGH (ref 70–99)
Potassium: 3.9 mmol/L (ref 3.5–5.1)
Sodium: 136 mmol/L (ref 135–145)
Total Bilirubin: 0.4 mg/dL (ref 0.3–1.2)
Total Protein: 7.4 g/dL (ref 6.5–8.1)

## 2020-08-18 LAB — CBC WITH DIFFERENTIAL/PLATELET
Abs Immature Granulocytes: 0.05 10*3/uL (ref 0.00–0.07)
Basophils Absolute: 0 10*3/uL (ref 0.0–0.1)
Basophils Relative: 0 %
Eosinophils Absolute: 0.2 10*3/uL (ref 0.0–0.5)
Eosinophils Relative: 2 %
HCT: 34.9 % — ABNORMAL LOW (ref 36.0–46.0)
Hemoglobin: 11.5 g/dL — ABNORMAL LOW (ref 12.0–15.0)
Immature Granulocytes: 1 %
Lymphocytes Relative: 31 %
Lymphs Abs: 2.9 10*3/uL (ref 0.7–4.0)
MCH: 27.3 pg (ref 26.0–34.0)
MCHC: 33 g/dL (ref 30.0–36.0)
MCV: 82.9 fL (ref 80.0–100.0)
Monocytes Absolute: 0.4 10*3/uL (ref 0.1–1.0)
Monocytes Relative: 5 %
Neutro Abs: 6 10*3/uL (ref 1.7–7.7)
Neutrophils Relative %: 61 %
Platelets: 237 10*3/uL (ref 150–400)
RBC: 4.21 MIL/uL (ref 3.87–5.11)
RDW: 16.5 % — ABNORMAL HIGH (ref 11.5–15.5)
WBC: 9.6 10*3/uL (ref 4.0–10.5)
nRBC: 0 % (ref 0.0–0.2)

## 2020-08-18 NOTE — Progress Notes (Signed)
Does have discomfort when urinating or having a BM in anal area.

## 2020-08-18 NOTE — Progress Notes (Signed)
[facility] CONSULT NOTE  Patient Care Team: Letta Median, MD as PCP - General (Family Medicine)  CHIEF COMPLAINTS/PURPOSE OF CONSULTATION: anal cancer  #  Oncology History Overview Note  # [facility] 2022- ANAL CA- EXCISIONAL BIOPSY:  - INVASIVE MODERATELY DIFFERENTIATED SQUAMOUS CELL CARCINOMA, PRESENT AT DEEP AND LATERAL TISSUE EDGES. [Dr.Sakai]  # PVD [s/p Stent; Dr.Dew]on Eliquis; active smoker;   # SURVIVORSHIP:   # GENETICS:   DIAGNOSIS:   STAGE:         ;  GOALS:  CURRENT/MOST RECENT THERAPY :     Cancer of anal canal ([place])  08/18/2020 Initial Diagnosis   Cancer of anal canal ([place])      HISTORY OF PRESENTING ILLNESS:  Mariah Rogers 70 y.o.  female history of COPD active smoking has been referred to [place] for further evaluation recommendations for newly diagnosed anal cancer.  Patient had been rectal bleeding for few times per week for the last 3 to 4 months.  Also noted to have rectal discomfort.  Complains of burning pain in the perineal area.  Worse with constipation.  Last colonoscopy 2020.  Patient has been evaluated by surgery-underwent anoscopy-biopsy-positive for squamous cell carcinoma of the anus/summarized above.  Review of Systems  Constitutional: Positive for malaise/fatigue. Negative for chills, diaphoresis, fever and weight loss.  HENT: Negative for nosebleeds and sore throat.   Eyes: Negative for double vision.  Respiratory: Positive for shortness of breath. Negative for cough, hemoptysis, sputum production and wheezing.   Cardiovascular: Negative for chest pain, palpitations, orthopnea and leg swelling.  Gastrointestinal: Positive for blood in stool. Negative for abdominal pain, constipation, diarrhea, heartburn, melena, nausea and vomiting.  Genitourinary: Negative for dysuria, frequency and urgency.  Musculoskeletal: Positive for back pain and joint pain.  Skin: Negative.  Negative for itching and rash.  Neurological:  Negative for dizziness, tingling, focal weakness, weakness and headaches.  Endo/Heme/Allergies: Does not bruise/bleed easily.  Psychiatric/Behavioral: Negative for depression. The patient is not nervous/anxious and does not have insomnia.      MEDICAL HISTORY:  Past Medical History:  Diagnosis Date  . Arthritis    LEFT KNEE   . CAD (coronary artery disease)   . COPD (chronic obstructive pulmonary disease) ([place])   . Depression   . Emphysema lung ([place])   . GERD (gastroesophageal reflux disease)   . Hernia, hiatal   . Hyperlipidemia   . Hypertension   . Peripheral vascular disease ([place])   . Pre-diabetes   . Thrombosis    in Lt leg    SURGICAL HISTORY: Past Surgical History:  Procedure Laterality Date  . ABDOMINAL AORTA STENT    . ABDOMINAL HYSTERECTOMY    . aortobifemoral bypass  2005  . CESAREAN SECTION     x 3  . COLONOSCOPY WITH PROPOFOL N/A 03/10/2019   Procedure: COLONOSCOPY WITH PROPOFOL;  Surgeon: Virgel Manifold, MD;  Location: [facility] ENDOSCOPY;  Service: Endoscopy;  Laterality: N/A;  . ESOPHAGOGASTRODUODENOSCOPY (EGD) WITH PROPOFOL N/A 03/10/2019   Procedure: ESOPHAGOGASTRODUODENOSCOPY (EGD) WITH PROPOFOL;  Surgeon: Virgel Manifold, MD;  Location: [facility] ENDOSCOPY;  Service: Endoscopy;  Laterality: N/A;  . EVALUATION UNDER ANESTHESIA WITH HEMORRHOIDECTOMY N/A 08/10/2020   Procedure: EXAM UNDER ANESTHESIA WITH HEMORRHOIDECTOMY;  Surgeon: Benjamine Sprague, DO;  Location: [facility] ORS;  Service: General;  Laterality: N/A;  . femoral stents    . LOWER EXTREMITY ANGIOGRAPHY Left 10/02/2016   Procedure: Lower Extremity Angiography;  Surgeon: Algernon Huxley, MD;  Location: [facility];  Service: Cardiovascular;  Laterality: Left;  . LOWER EXTREMITY ANGIOGRAPHY N/A 10/03/2016   Procedure: Lower Extremity Angiography with possible revascularization;  Surgeon: Algernon Huxley, MD;  Location: [facility];  Service: Cardiovascular;  Laterality: N/A;  . VASCULAR SURGERY       SOCIAL HISTORY: Social History   Socioeconomic History  . Marital status: Widowed    Spouse name: Not on file  . Number of children: Not on file  . Years of education: Not on file  . Highest education level: Not on file  Occupational History  . Not on file  Tobacco Use  . Smoking status: Current Every Day Smoker    Packs/day: 0.50    Years: 60.00    Pack years: 30.00    Types: Cigarettes  . Smokeless tobacco: Never Used  Vaping Use  . Vaping Use: Never used  Substance and Sexual Activity  . Alcohol use: No  . Drug use: Yes    Types: Marijuana    Comment: 2-3 times a week- smoked last night 08/09/20  . Sexual activity: Never  Other Topics Concern  . Not on file  Social History Narrative   Lives in [place]. Smokes-1 pck in 3 days. No alcohol. Worked in [employer]. Son with her son.    Social Determinants of Health   Financial Resource Strain: Not on file  Food Insecurity: Not on file  Transportation Needs: Not on file  Physical Activity: Not on file  Stress: Not on file  Social Connections: Not on file  Intimate Partner Violence: Not on file    FAMILY HISTORY: Family History  Problem Relation Age of Onset  . Osteoporosis Mother   . Alzheimer's disease Mother   . Colon cancer Daughter 77    ALLERGIES:  is allergic to codeine and metformin and related.  MEDICATIONS:  Current Outpatient Medications  Medication Sig Dispense Refill  . acetaminophen (TYLENOL) 325 MG tablet Take 2 tablets (650 mg total) by mouth every 8 (eight) hours as needed for mild pain. 40 tablet 0  . albuterol (VENTOLIN HFA) 108 (90 Base) MCG/ACT inhaler Inhale 2 puffs into the lungs every 6 (six) hours as needed for wheezing or shortness of breath.    Marland Kitchen apixaban (ELIQUIS) 5 MG TABS tablet Take 1 tablet (5 mg total) by mouth 2 (two) times daily. 90 tablet 4  . aspirin EC 81 MG EC tablet Take 1 tablet (81 mg total) by mouth daily. (Patient taking differently: Take 81 mg by mouth at bedtime.)  30 tablet 5  . cholecalciferol (VITAMIN D) 1000 units tablet Take 1,000 Units by mouth 2 (two) times daily.    . diphenhydrAMINE (BENADRYL) 25 MG tablet Take 50 mg by mouth at bedtime.    . docusate sodium (COLACE) 100 MG capsule Take 200 mg by mouth at bedtime.    Marland Kitchen ezetimibe-simvastatin (VYTORIN) 10-40 MG tablet Take 1 tablet by mouth daily.    . Fluticasone-Salmeterol (ADVAIR) 250-50 MCG/DOSE AEPB Inhale 1 puff into the lungs 2 (two) times daily as needed (shortness of breath).    . hydrocortisone 2.5 % cream Apply 1 application topically 2 (two) times daily as needed (itching).    Marland Kitchen ibuprofen (ADVIL) 400 MG tablet Take 1 tablet (400 mg total) by mouth every 8 (eight) hours as needed for mild pain or moderate pain. 30 tablet 0  . lidocaine-prilocaine (EMLA) cream Apply 30 -45 mins prior to port access. 30 g 0  . lisinopril (ZESTRIL) 20 MG tablet Take 20 mg  by mouth daily.    . nortriptyline (PAMELOR) 10 MG capsule Take 10 mg by mouth daily.    Marland Kitchen omeprazole (PRILOSEC) 20 MG capsule Take 20 mg by mouth daily.    . ondansetron (ZOFRAN) 8 MG tablet One pill every 8 hours as needed for nausea/vomitting. 40 tablet 1  . prochlorperazine (COMPAZINE) 10 MG tablet Take 1 tablet (10 mg total) by mouth every 6 (six) hours as needed for nausea or vomiting. 40 tablet 1  . sertraline (ZOLOFT) 100 MG tablet Take 100 mg by mouth 2 (two) times daily.     No current facility-administered medications for this visit.      Marland Kitchen  PHYSICAL EXAMINATION: ECOG PERFORMANCE STATUS: 1 - Symptomatic but completely ambulatory  Vitals:   08/18/20 1401  BP: (!) 148/62  Pulse: 74  Temp: 98.9 F (37.2 C)  SpO2: 100%   Filed Weights   08/18/20 1401  Weight: 151 lb 4 oz (68.6 kg)    Physical Exam Constitutional:      Comments: With son. Ambulating.   HENT:     Head: Normocephalic and atraumatic.     Mouth/Throat:     Mouth: Oropharynx is clear and moist.     Pharynx: No oropharyngeal exudate.  Eyes:      Pupils: Pupils are equal, round, and reactive to light.  Cardiovascular:     Rate and Rhythm: Normal rate and regular rhythm.  Pulmonary:     Effort: No respiratory distress.     Breath sounds: No wheezing.     Comments: Decreased breath sounds at bases.  Abdominal:     General: Bowel sounds are normal. There is no distension.     Palpations: Abdomen is soft. There is no mass.     Tenderness: There is no abdominal tenderness. There is no guarding or rebound.  Musculoskeletal:        General: No tenderness or edema. Normal range of motion.     Cervical back: Normal range of motion and neck supple.  Skin:    General: Skin is warm.  Neurological:     Mental Status: She is alert and oriented to person, place, and time.  Psychiatric:        Mood and Affect: Affect normal.      LABORATORY DATA:  I have reviewed the data as listed Lab Results  Component Value Date   WBC 9.6 08/18/2020   HGB 11.5 (L) 08/18/2020   HCT 34.9 (L) 08/18/2020   MCV 82.9 08/18/2020   PLT 237 08/18/2020   Recent Labs    08/08/20 0922 08/18/20 1442  NA 137 136  K 4.6 3.9  CL 103 101  CO2 27 22  GLUCOSE 103* 127*  BUN 19 13  CREATININE 0.99 1.12*  CALCIUM 9.5 9.1  GFRNONAA >60 53*  PROT  --  7.4  ALBUMIN  --  3.8  AST  --  21  ALT  --  17  ALKPHOS  --  77  BILITOT  --  0.4    RADIOGRAPHIC STUDIES: I have personally reviewed the radiological images as listed and agreed with the findings in the report. No results found.  ASSESSMENT & PLAN:   Cancer of anal canal (HCC) #Anal cancer squamous cell-clinical T2N0.  I reviewed the pathology with the patient and son in detail.  Final staging based on PET scan.  Discussed at tumor conference.  # Discussed use of definitive concurrent chemoradiation for treatment of localized anal cancer is cure discussed  that the duration of treatment being approximately 6 weeks.  Chemotherapy includes continuous 5-FU infusion plus mitomycin day 1 day 29.  Wait  for PET to order.   # Discussed the at length schedule /logistics of continuous 5-FU/need for a port placement etc. Discussed the than usual side effects of mitomycin including-pulmonary toxicity/severe neutropenia/sores in the mouth.  Will refer to radiation oncology.  #COPD-on inhalers not on oxygen.  Need to monitor closely on mitomycin.  #Peripheral vascular disease [Dr. Dew]-Eliquis.  Discussed with Dr. Lysle Pearl regarding port placement.  In agreement.  We will have to hold off Eliquis 3 days prior to port placement.  # Chemotherapy education; port placement. Hopefully the planned start chemotherapy based on RT starting. Antiemetics-Zofran and Compazine; EMLA cream sent to pharmacy  Thank you Dr.Sakai for allowing me to participate in the care of your pleasant patient. Please do not hesitate to contact me with questions or concerns in the interim. Discussed with Dr.Sakai re: port placement.    # DISPOSITION: # chemo education # labs today- cbc/cmp #PET ASAP # port Dr.Sakai- will inform Dr.Sakai # referral to Dr.Crystal re: Anal cancer ASAP # Follow up 1-2 days after the PET scan- Dr.B  All questions were answered. The patient knows to call the clinic with any problems, questions or concerns.    Cammie Sickle, MD 08/21/2020 8:17 AM

## 2020-08-18 NOTE — Assessment & Plan Note (Addendum)
#  Anal cancer squamous cell-clinical T2N0.  I reviewed the pathology with the patient and son in detail.  Final staging based on PET scan.  Discussed at tumor conference.  # Discussed use of definitive concurrent chemoradiation for treatment of localized anal cancer is cure discussed that the duration of treatment being approximately 6 weeks.  Chemotherapy includes continuous 5-FU infusion plus mitomycin day 1 day 29.  Wait for PET to order.   # Discussed the at length schedule /logistics of continuous 5-FU/need for a port placement etc. Discussed the than usual side effects of mitomycin including-pulmonary toxicity/severe neutropenia/sores in the mouth.  Will refer to radiation oncology.  #COPD-on inhalers not on oxygen.  Need to monitor closely on mitomycin.  #Peripheral vascular disease [Dr. Dew]-Eliquis.  Discussed with Dr. Lysle Pearl regarding port placement.  In agreement.  We will have to hold off Eliquis 3 days prior to port placement.  # Chemotherapy education; port placement. Hopefully the planned start chemotherapy based on RT starting. Antiemetics-Zofran and Compazine; EMLA cream sent to pharmacy  Thank you Dr.Sakai for allowing me to participate in the care of your pleasant patient. Please do not hesitate to contact me with questions or concerns in the interim. Discussed with Dr.Sakai re: port placement.    # DISPOSITION: # chemo education # labs today- cbc/cmp #PET ASAP # port Dr.Sakai- will inform Dr.Sakai # referral to Dr.Crystal re: Anal cancer ASAP # Follow up 1-2 days after the PET scan- Dr.B

## 2020-08-21 MED ORDER — LIDOCAINE-PRILOCAINE 2.5-2.5 % EX CREA
TOPICAL_CREAM | CUTANEOUS | 0 refills | Status: DC
Start: 2020-08-21 — End: 2021-09-28

## 2020-08-21 MED ORDER — ONDANSETRON HCL 8 MG PO TABS: ORAL_TABLET | ORAL | 1 refills | Status: DC

## 2020-08-21 MED ORDER — PROCHLORPERAZINE MALEATE 10 MG PO TABS: 10.0000 mg | ORAL_TABLET | Freq: Four times a day (QID) | ORAL | 1 refills | Status: DC | PRN

## 2020-08-23 ENCOUNTER — Ambulatory Visit: Payer: Self-pay | Admitting: Surgery

## 2020-08-23 NOTE — Patient Instructions (Signed)
Fluorouracil, 5-FU injection What is this medicine? FLUOROURACIL, 5-FU (flure oh YOOR a sil) is a chemotherapy drug. It slows the growth of cancer cells. This medicine is used to treat many types of cancer like breast cancer, colon or rectal cancer, pancreatic cancer, and stomach cancer. This medicine may be used for other purposes; ask your health care provider or pharmacist if you have questions. COMMON BRAND NAME(S): Adrucil What should I tell my health care provider before I take this medicine? They need to know if you have any of these conditions:  blood disorders  dihydropyrimidine dehydrogenase (DPD) deficiency  infection (especially a virus infection such as chickenpox, cold sores, or herpes)  kidney disease  liver disease  malnourished, poor nutrition  recent or ongoing radiation therapy  an unusual or allergic reaction to fluorouracil, other chemotherapy, other medicines, foods, dyes, or preservatives  pregnant or trying to get pregnant  breast-feeding How should I use this medicine? This drug is given as an infusion or injection into a vein. It is administered in a hospital or clinic by a specially trained health care professional. Talk to your pediatrician regarding the use of this medicine in children. Special care may be needed. Overdosage: If you think you have taken too much of this medicine contact a poison control center or emergency room at once. NOTE: This medicine is only for you. Do not share this medicine with others. What if I miss a dose? It is important not to miss your dose. Call your doctor or health care professional if you are unable to keep an appointment. What may interact with this medicine? Do not take this medicine with any of the following medications:  live virus vaccines This medicine may also interact with the following medications:  medicines that treat or prevent blood clots like warfarin, enoxaparin, and dalteparin This list may not  describe all possible interactions. Give your health care provider a list of all the medicines, herbs, non-prescription drugs, or dietary supplements you use. Also tell them if you smoke, drink alcohol, or use illegal drugs. Some items may interact with your medicine. What should I watch for while using this medicine? Visit your doctor for checks on your progress. This drug may make you feel generally unwell. This is not uncommon, as chemotherapy can affect healthy cells as well as cancer cells. Report any side effects. Continue your course of treatment even though you feel ill unless your doctor tells you to stop. In some cases, you may be given additional medicines to help with side effects. Follow all directions for their use. Call your doctor or health care professional for advice if you get a fever, chills or sore throat, or other symptoms of a cold or flu. Do not treat yourself. This drug decreases your body's ability to fight infections. Try to avoid being around people who are sick. This medicine may increase your risk to bruise or bleed. Call your doctor or health care professional if you notice any unusual bleeding. Be careful brushing and flossing your teeth or using a toothpick because you may get an infection or bleed more easily. If you have any dental work done, tell your dentist you are receiving this medicine. Avoid taking products that contain aspirin, acetaminophen, ibuprofen, naproxen, or ketoprofen unless instructed by your doctor. These medicines may hide a fever. Do not become pregnant while taking this medicine. Women should inform their doctor if they wish to become pregnant or think they might be pregnant. There is a potential   for serious side effects to an unborn child. Talk to your health care professional or pharmacist for more information. Do not breast-feed an infant while taking this medicine. Men should inform their doctor if they wish to father a child. This medicine may  lower sperm counts. Do not treat diarrhea with over the counter products. Contact your doctor if you have diarrhea that lasts more than 2 days or if it is severe and watery. This medicine can make you more sensitive to the sun. Keep out of the sun. If you cannot avoid being in the sun, wear protective clothing and use sunscreen. Do not use sun lamps or tanning beds/booths. What side effects may I notice from receiving this medicine? Side effects that you should report to your doctor or health care professional as soon as possible:  allergic reactions like skin rash, itching or hives, swelling of the face, lips, or tongue  low blood counts - this medicine may decrease the number of white blood cells, red blood cells and platelets. You may be at increased risk for infections and bleeding.  signs of infection - fever or chills, cough, sore throat, pain or difficulty passing urine  signs of decreased platelets or bleeding - bruising, pinpoint red spots on the skin, black, tarry stools, blood in the urine  signs of decreased red blood cells - unusually weak or tired, fainting spells, lightheadedness  breathing problems  changes in vision  chest pain  mouth sores  nausea and vomiting  pain, swelling, redness at site where injected  pain, tingling, numbness in the hands or feet  redness, swelling, or sores on hands or feet  stomach pain  unusual bleeding Side effects that usually do not require medical attention (report to your doctor or health care professional if they continue or are bothersome):  changes in finger or toe nails  diarrhea  dry or itchy skin  hair loss  headache  loss of appetite  sensitivity of eyes to the light  stomach upset  unusually teary eyes This list may not describe all possible side effects. Call your doctor for medical advice about side effects. You may report side effects to FDA at 1-800-FDA-1088. Where should I keep my medicine? This  drug is given in a hospital or clinic and will not be stored at home. NOTE: This sheet is a summary. It may not cover all possible information. If you have questions about this medicine, talk to your doctor, pharmacist, or health care provider.  2021 Elsevier/Gold Standard (2019-05-04 15:00:03) Mitomycin injection What is this medicine? MITOMYCIN (mye toe MYE sin) is a chemotherapy drug. This medicine is used to treat cancer of the stomach and pancreas. This medicine may be used for other purposes; ask your health care provider or pharmacist if you have questions. COMMON BRAND NAME(S): Mutamycin What should I tell my health care provider before I take this medicine? They need to know if you have any of these conditions:  bleeding disorders  infection (especially a viral infection such as chickenpox, cold sores, or herpes)  low blood counts, like white cells, platelets, or red blood cells  kidney disease  an unusual or allergic reaction to mitomycin, other medicines, foods, dyes, or preservatives  pregnant or trying to get pregnant  breast-feeding How should I use this medicine? This drug is given as an injection or infusion into a vein. It is administered in a hospital or clinic by a specially trained health care professional. Talk to your pediatrician regarding the  use of this medicine in children. Special care may be needed. Overdosage: If you think you have taken too much of this medicine contact a poison control center or emergency room at once. NOTE: This medicine is only for you. Do not share this medicine with others. What if I miss a dose? It is important not to miss your dose. Call your doctor or health care professional if you are unable to keep an appointment. What may interact with this medicine? Interactions are not expected. This list may not describe all possible interactions. Give your health care provider a list of all the medicines, herbs, non-prescription drugs,  or dietary supplements you use. Also tell them if you smoke, drink alcohol, or use illegal drugs. Some items may interact with your medicine. What should I watch for while using this medicine? Your condition will be monitored carefully while you are receiving this medicine. You will need important blood work done while you are taking this medicine. This drug may make you feel generally unwell. This is not uncommon, as chemotherapy can affect healthy cells as well as cancer cells. Report any side effects. Continue your course of treatment even though you feel ill unless your doctor tells you to stop. Call your doctor or health care professional for advice if you get a fever, chills or sore throat, or other symptoms of a cold or flu. Do not treat yourself. This drug decreases your body's ability to fight infections. Try to avoid being around people who are sick. This medicine may increase your risk to bruise or bleed. Call your doctor or health care professional if you notice any unusual bleeding. Be careful brushing and flossing your teeth or using a toothpick because you may get an infection or bleed more easily. If you have any dental work done, tell your dentist you are receiving this medicine. Avoid taking products that contain aspirin, acetaminophen, ibuprofen, naproxen, or ketoprofen unless instructed by your doctor. These medicines may hide a fever. Do not become pregnant while taking this medicine. Women should inform their doctor if they wish to become pregnant or think they might be pregnant. There is a potential for serious side effects to an unborn child. Talk to your health care professional or pharmacist for more information. Do not breast-feed an infant while taking this medicine. What side effects may I notice from receiving this medicine? Side effects that you should report to your doctor or health care professional as soon as possible:  allergic reactions like skin rash, itching or  hives, swelling of the face, lips, or tongue  breathing problems  pain, redness, or irritation at site where injected  signs and symptoms of bleeding such as bloody or black, tarry stools; red or dark brown urine; spitting up blood or brown material that looks like coffee grounds; red spots on the skin; unusual bruising or bleeding from the eyes, gums, or nose  signs and symptoms of infection like fever; chills; cough; sore throat; pain or trouble passing urine  signs and symptoms of kidney injury like trouble passing urine or change in the amount of urine  signs and symptoms of low red blood cells or anemia such as unusually weak or tired; feeling faint or lightheaded; falls; breathing problems Side effects that usually do not require medical attention (report to your doctor or health care professional if they continue or are bothersome):  green to blue color of urine  hair loss  loss of appetite  mouth sores  nausea, vomiting This  list may not describe all possible side effects. Call your doctor for medical advice about side effects. You may report side effects to FDA at 1-800-FDA-1088. Where should I keep my medicine? This drug is given in a hospital or clinic and will not be stored at home. NOTE: This sheet is a summary. It may not cover all possible information. If you have questions about this medicine, talk to your doctor, pharmacist, or health care provider.  2021 Elsevier/Gold Standard (2019-01-19 16:33:50)

## 2020-08-23 NOTE — H&P (View-Only) (Signed)
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 female who returns for above. 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  Past Surgical History:  has a past surgical history that includes Cesarean section; Hysterectomy; Aorta - femoral artery bypass graft; and Endovascular Transluminal Balloon Angioplasty Femoral/Popliteal Artery (Left).  Family History: family history includes Diabetes type II in her son; Osteoarthritis in her mother.  Social History:  reports that she has been smoking. She has never used smokeless tobacco. She reports that she does not drink alcohol. No history on file for drug use.  Current Medications: has a current medication list which includes the following prescription(s): apixaban, aspirin, cholecalciferol, docusate, ezetimibe-simvastatin, fluticasone propion-salmeterol, ibuprofen, lidocaine, lidocaine-prilocaine, lisinopril, nortriptyline, omeprazole, proair hfa, sertraline, spiriva with handihaler, hydrocortisone, and metformin.  Allergies:  Allergies Allergen Reactions . Codeine Nausea And Vomiting   ROS:  A 15 point review of systems was performed and pertinent positives and negatives noted in HPI   Objective:  BP 124/69   Pulse 79   Ht 154.9 cm (5\' 1" )   Wt 65.8 kg (145 lb)   BMI 27.40 kg/m   Constitutional :  alert, appears stated age, cooperative and no distress Lymphatics/Throat:  no asymmetry, masses, or scars Respiratory:   clear to auscultation bilaterally Cardiovascular:  regular rate and rhythm Gastrointestinal: soft, non-tender; bowel sounds normal; no masses,  no organomegaly.   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 Risk alternative benefits discussed.  Risks include bleeding, infection, dislocation, migration, malfunction, unsuccessful placement, pneumothorax, and additional procedures to address that risks.  Benefits include initiation of chemotherapy.  Alternative includes non-IV infusion therapy.  We discussed the need for the port to infuse IV chemotherapy agents, and how the port can be a temporary and/or permanent option depending on patient preference after completion of chemotherapy.  Port will be okay to use as soon as it is placed.  Patient verbalized understanding and all questions and concerns addressed.  HOLD ELIQUIS PRIOR TO SURGERY PENDING PRIMARY APPROVAL

## 2020-08-23 NOTE — H&P (Signed)
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 female who returns for above. 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  Past Surgical History:  has a past surgical history that includes Cesarean section; Hysterectomy; Aorta - femoral artery bypass graft; and Endovascular Transluminal Balloon Angioplasty Femoral/Popliteal Artery (Left).  Family History: family history includes Diabetes type II in her son; Osteoarthritis in her mother.  Social History:  reports that she has been smoking. She has never used smokeless tobacco. She reports that she does not drink alcohol. No history on file for drug use.  Current Medications: has a current medication list which includes the following prescription(s): apixaban, aspirin, cholecalciferol, docusate, ezetimibe-simvastatin, fluticasone propion-salmeterol, ibuprofen, lidocaine, lidocaine-prilocaine, lisinopril, nortriptyline, omeprazole, proair hfa, sertraline, spiriva with handihaler, hydrocortisone, and metformin.  Allergies:  Allergies Allergen Reactions . Codeine Nausea And Vomiting   ROS:  A 15 point review of systems was performed and pertinent positives and negatives noted in HPI   Objective:  BP 124/69   Pulse 79   Ht 154.9 cm (5\' 1" )   Wt 65.8 kg (145 lb)   BMI 27.40 kg/m   Constitutional :  alert, appears stated age, cooperative and no distress Lymphatics/Throat:  no asymmetry, masses, or scars Respiratory:   clear to auscultation bilaterally Cardiovascular:  regular rate and rhythm Gastrointestinal: soft, non-tender; bowel sounds normal; no masses,  no organomegaly.   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 Risk alternative benefits discussed.  Risks include bleeding, infection, dislocation, migration, malfunction, unsuccessful placement, pneumothorax, and additional procedures to address that risks.  Benefits include initiation of chemotherapy.  Alternative includes non-IV infusion therapy.  We discussed the need for the port to infuse IV chemotherapy agents, and how the port can be a temporary and/or permanent option depending on patient preference after completion of chemotherapy.  Port will be okay to use as soon as it is placed.  Patient verbalized understanding and all questions and concerns addressed.  HOLD ELIQUIS PRIOR TO SURGERY PENDING PRIMARY APPROVAL

## 2020-08-24 ENCOUNTER — Inpatient Hospital Stay: Payer: Medicare Other

## 2020-08-28 ENCOUNTER — Encounter: Payer: Self-pay | Admitting: Radiation Oncology

## 2020-08-28 ENCOUNTER — Ambulatory Visit
Admission: RE | Admit: 2020-08-28 | Discharge: 2020-08-28 | Disposition: A | Discharge status: Home / Self Care | Payer: Medicare Other | Source: Ambulatory Visit | Attending: Radiation Oncology | Admitting: Radiation Oncology

## 2020-08-28 VITALS — BP 159/71 | HR 81 | Temp 97.0°F | Wt 153.7 lb

## 2020-08-28 DIAGNOSIS — C211 Malignant neoplasm of anal canal: Secondary | ICD-10-CM

## 2020-08-28 NOTE — Consult Note (Signed)
NEW PATIENT EVALUATION  Name: Mariah Rogers  MRN: [id number]  Date:   08/28/2020     DOB: 17-Jan-1951   This 70 y.o. 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  REFERRING PHYSICIAN: Letta Median, MD  CHIEF COMPLAINT:  Chief Complaint  Patient presents with  . Consult    DIAGNOSIS: The encounter diagnosis was Cancer of anal canal ([place]).   PREVIOUS INVESTIGATIONS:  PET CT scan ordered Pathology report reviewed Clinical notes reviewed  HPI: Patient is a 70 year old female who presented with a several month history of rectal bleeding as well as itching and burning in that region.  She eventually was seen by surgery underwent anoscopy.  There was a masslike nodular area extending from the external sphincter muscle extending into the rectal vault.  Tumor measured approximate 4 cm with ulceration.  Biopsy was positive for moderately differentiated squamous cell carcinoma.  Patient is a PET CT scan scheduled for next week.  She is seen medical oncology and discussed concurrent chemoradiation.  She is seen today for region oncology opinion she still having some burning and irritation with slight bleeding in the anal region.  PLANNED TREATMENT REGIMEN: Concurrent chemoradiation  PAST MEDICAL HISTORY:  has a past medical history of Arthritis, CAD (coronary artery disease), COPD (chronic obstructive pulmonary disease) ([place]), Depression, Emphysema lung ([place]), GERD (gastroesophageal reflux disease), Hernia, hiatal, Hyperlipidemia, Hypertension, Peripheral vascular disease ([place]), Pre-diabetes, and Thrombosis.    PAST SURGICAL HISTORY:  Past Surgical History:  Procedure Laterality Date  . ABDOMINAL AORTA STENT    . ABDOMINAL HYSTERECTOMY    . aortobifemoral bypass  2005  . CESAREAN SECTION     x 3  . COLONOSCOPY WITH PROPOFOL N/A 03/10/2019   Procedure: COLONOSCOPY WITH PROPOFOL;  Surgeon: Virgel Manifold, MD;  Location: [facility]  ENDOSCOPY;  Service: Endoscopy;  Laterality: N/A;  . ESOPHAGOGASTRODUODENOSCOPY (EGD) WITH PROPOFOL N/A 03/10/2019   Procedure: ESOPHAGOGASTRODUODENOSCOPY (EGD) WITH PROPOFOL;  Surgeon: Virgel Manifold, MD;  Location: [facility] ENDOSCOPY;  Service: Endoscopy;  Laterality: N/A;  . EVALUATION UNDER ANESTHESIA WITH HEMORRHOIDECTOMY N/A 08/10/2020   Procedure: EXAM UNDER ANESTHESIA WITH HEMORRHOIDECTOMY;  Surgeon: Benjamine Sprague, DO;  Location: [facility] ORS;  Service: General;  Laterality: N/A;  . femoral stents    . LOWER EXTREMITY ANGIOGRAPHY Left 10/02/2016   Procedure: Lower Extremity Angiography;  Surgeon: Algernon Huxley, MD;  Location: [place] [facility];  Service: Cardiovascular;  Laterality: Left;  . LOWER EXTREMITY ANGIOGRAPHY N/A 10/03/2016   Procedure: Lower Extremity Angiography with possible revascularization;  Surgeon: Algernon Huxley, MD;  Location: [place] [facility];  Service: Cardiovascular;  Laterality: N/A;  . VASCULAR SURGERY      FAMILY HISTORY: family history includes Alzheimer's disease in her mother; Colon cancer (age of onset: 32) in her daughter; Osteoporosis in her mother.  SOCIAL HISTORY:  reports that she has been smoking cigarettes. She has a 30.00 pack-year smoking history. She has never used smokeless tobacco. She reports current drug use. Drug: Marijuana. She reports that she does not drink alcohol.  ALLERGIES: Codeine and Metformin and related  MEDICATIONS:  Current Outpatient Medications  Medication Sig Dispense Refill  . acetaminophen (TYLENOL) 325 MG tablet Take 2 tablets (650 mg total) by mouth every 8 (eight) hours as needed for mild pain. 40 tablet 0  . albuterol (VENTOLIN HFA) 108 (90 Base) MCG/ACT inhaler Inhale 2 puffs into the lungs every 6 (six) hours as needed for wheezing or  shortness of breath.    Marland Kitchen apixaban (ELIQUIS) 5 MG TABS tablet Take 1 tablet (5 mg total) by mouth 2 (two) times daily. 90 tablet 4  . aspirin EC 81 MG EC tablet Take 1 tablet (81 mg total)  by mouth daily. (Patient taking differently: Take 81 mg by mouth at bedtime.) 30 tablet 5  . cholecalciferol (VITAMIN D) 1000 units tablet Take 1,000 Units by mouth 2 (two) times daily.    . diphenhydrAMINE (BENADRYL) 25 MG tablet Take 50 mg by mouth at bedtime.    . docusate sodium (COLACE) 100 MG capsule Take 200 mg by mouth at bedtime.    Marland Kitchen ezetimibe-simvastatin (VYTORIN) 10-40 MG tablet Take 1 tablet by mouth daily.    . Fluticasone-Salmeterol (ADVAIR) 250-50 MCG/DOSE AEPB Inhale 1 puff into the lungs 2 (two) times daily as needed (shortness of breath).    . hydrocortisone 2.5 % cream Apply 1 application topically 2 (two) times daily as needed (itching).    Marland Kitchen ibuprofen (ADVIL) 400 MG tablet Take 1 tablet (400 mg total) by mouth every 8 (eight) hours as needed for mild pain or moderate pain. 30 tablet 0  . lidocaine (XYLOCAINE) 5 % ointment Apply 1 application topically every evening.    . lidocaine-prilocaine (EMLA) cream Apply 30 -45 mins prior to port access. 30 g 0  . lisinopril (ZESTRIL) 20 MG tablet Take 20 mg by mouth daily.    . nortriptyline (PAMELOR) 10 MG capsule Take 10 mg by mouth daily.    Marland Kitchen omeprazole (PRILOSEC) 20 MG capsule Take 20 mg by mouth daily.    . ondansetron (ZOFRAN) 8 MG tablet One pill every 8 hours as needed for nausea/vomitting. 40 tablet 1  . prochlorperazine (COMPAZINE) 10 MG tablet Take 1 tablet (10 mg total) by mouth every 6 (six) hours as needed for nausea or vomiting. 40 tablet 1  . sertraline (ZOLOFT) 100 MG tablet Take 100 mg by mouth 2 (two) times daily.     No current facility-administered medications for this encounter.    ECOG PERFORMANCE STATUS:  1 - Symptomatic but completely ambulatory  REVIEW OF SYSTEMS: Patient denies any weight loss, fatigue, weakness, fever, chills or night sweats. Patient denies any loss of vision, blurred vision. Patient denies any ringing  of the ears or hearing loss. No irregular heartbeat. Patient denies heart murmur or  history of fainting. Patient denies any chest pain or pain radiating to her upper extremities. Patient denies any shortness of breath, difficulty breathing at night, cough or hemoptysis. Patient denies any swelling in the lower legs. Patient denies any nausea vomiting, vomiting of blood, or coffee ground material in the vomitus. Patient denies any stomach pain. Patient states has had normal bowel movements no significant constipation or diarrhea. Patient denies any dysuria, hematuria or significant nocturia. Patient denies any problems walking, swelling in the joints or loss of balance. Patient denies any skin changes, loss of hair or loss of weight. Patient denies any excessive worrying or anxiety or significant depression. Patient denies any problems with insomnia. Patient denies excessive thirst, polyuria, polydipsia. Patient denies any swollen glands, patient denies easy bruising or easy bleeding. Patient denies any recent infections, allergies or URI. Patient "s visual fields have not changed significantly in recent time.   PHYSICAL EXAM: BP (!) 159/71   Pulse 81   Temp (!) 97 F (36.1 C) (Tympanic)   Wt 153 lb 11.2 oz (69.7 kg)   SpO2 99% Comment: on room air  BMI 29.04  kg/m  On rectal exam there is a fixed mass nodular density present at the anal verge.  No evidence of inguinal adenopathy is appreciated.  Well-developed well-nourished patient in NAD. HEENT reveals PERLA, EOMI, discs not visualized.  Oral cavity is clear. No oral mucosal lesions are identified. Neck is clear without evidence of cervical or supraclavicular adenopathy. Lungs are clear to A&P. Cardiac examination is essentially unremarkable with regular rate and rhythm without murmur rub or thrill. Abdomen is benign with no organomegaly or masses noted. Motor sensory and DTR levels are equal and symmetric in the upper and lower extremities. Cranial nerves II through XII are grossly intact. Proprioception is intact. No peripheral  adenopathy or edema is identified. No motor or sensory levels are noted. Crude visual fields are within normal range.  LABORATORY DATA: Pathology report reviewed    RADIOLOGY RESULTS: PET CT scan will be reviewed prior to simulation   IMPRESSION: At least a T2 squamous cell carcinoma the anus in 70 year old female to be completely staged with PET CT scan  PLAN: At this time I agree with concurrent chemoradiation.  We will make final recommendations about treatment planning based on her PET CT scan results.  I am interested in looking at inguinal as well as pelvic lymph nodes for possible locally advanced disease.  I will plan a covering those regions anyway although may need to go to a high dose should there be any PET positive nodes in that region.  Risks and benefits of treatment including skin reaction fatigue alteration of blood counts possible increased lower urinary tract symptoms diarrhea burning in the perineal region all were discussed in detail with the patient and her son.  Both seem to comprehend my recommendations well.  I have tentatively set her up for CT simulation after her PET CT scan next week.  There will be extra effort by both professional staff as well as technical staff to coordinate and manage concurrent chemoradiation and ensuing side effects during her treatments.   I would like to take this opportunity to thank you for allowing me to participate in the care of your patient.[name], MD

## 2020-09-04 ENCOUNTER — Ambulatory Visit
Admission: RE | Admit: 2020-09-04 | Discharge: 2020-09-04 | Disposition: A | Discharge status: Home / Self Care | Payer: Medicare Other | Source: Ambulatory Visit | Attending: Internal Medicine | Admitting: Internal Medicine

## 2020-09-04 ENCOUNTER — Other Ambulatory Visit: Payer: Self-pay

## 2020-09-04 DIAGNOSIS — I700 Atherosclerosis of aorta: Secondary | ICD-10-CM | POA: Diagnosis not present

## 2020-09-04 DIAGNOSIS — K449 Diaphragmatic hernia without obstruction or gangrene: Secondary | ICD-10-CM | POA: Insufficient documentation

## 2020-09-04 DIAGNOSIS — Z85048 Personal history of other malignant neoplasm of rectum, rectosigmoid junction, and anus: Secondary | ICD-10-CM | POA: Insufficient documentation

## 2020-09-04 DIAGNOSIS — C211 Malignant neoplasm of anal canal: Secondary | ICD-10-CM | POA: Diagnosis present

## 2020-09-04 DIAGNOSIS — J439 Emphysema, unspecified: Secondary | ICD-10-CM | POA: Insufficient documentation

## 2020-09-04 DIAGNOSIS — R911 Solitary pulmonary nodule: Secondary | ICD-10-CM | POA: Insufficient documentation

## 2020-09-04 LAB — GLUCOSE, CAPILLARY: Glucose-Capillary: 104 mg/dL — ABNORMAL HIGH (ref 70–99)

## 2020-09-04 MED ORDER — FLUDEOXYGLUCOSE F - 18 (FDG) INJECTION
7.9000 | Freq: Once | INTRAVENOUS | Status: AC | PRN
  Administered 2020-09-04: 7.76 via INTRAVENOUS

## 2020-09-05 ENCOUNTER — Telehealth: Payer: Self-pay | Admitting: Internal Medicine

## 2020-09-05 ENCOUNTER — Inpatient Hospital Stay (HOSPITAL_BASED_OUTPATIENT_CLINIC_OR_DEPARTMENT_OTHER): Payer: Medicare Other | Admitting: Internal Medicine

## 2020-09-05 ENCOUNTER — Encounter: Payer: Self-pay | Admitting: Internal Medicine

## 2020-09-05 DIAGNOSIS — C211 Malignant neoplasm of anal canal: Secondary | ICD-10-CM

## 2020-09-05 MED ORDER — LIDOCAINE 5 % EX OINT: 1.0000 "application " | TOPICAL_OINTMENT | Freq: Every evening | CUTANEOUS | 3 refills | Status: DC

## 2020-09-05 NOTE — Progress Notes (Signed)
[facility] CONSULT NOTE  Patient Care Team: Letta Median, MD as PCP - General (Family Medicine) Clent Jacks, RN as Oncology Nurse Navigator  CHIEF COMPLAINTS/PURPOSE OF CONSULTATION: anal cancer  #  Oncology History Overview Note  # [facility] 2022- ANAL CA- EXCISIONAL BIOPSY:  - INVASIVE MODERATELY DIFFERENTIATED SQUAMOUS CELL CARCINOMA, PRESENT AT DEEP AND LATERAL TISSUE EDGES. [Dr.Sakai]  # PVD [s/p Stent; Dr.Dew]on Eliquis; active smoker;   # SURVIVORSHIP:   # GENETICS:   DIAGNOSIS:   STAGE:         ;  GOALS:  CURRENT/MOST RECENT THERAPY :     Cancer of anal canal ([place])  08/18/2020 Initial Diagnosis   Cancer of anal canal ([place])   09/05/2020 -  Chemotherapy    Patient is on Treatment Plan: ANUS MITOMYCIN D1,28 / 5FU D1-4, 28-31 Q32D         HISTORY OF PRESENTING ILLNESS:  Mariah Rogers 70 y.o.  female history of COPD active smoking/diagnosed with anal cancer is here to review the results of the PET scan/and discuss treatment options.  In the interim patient has been evaluated by radiation oncology; awaiting to start radiation April 1.  Patient continues to complain of pain in anal region.  Intermittent blood.  No constipation or diarrhea.  Review of Systems  Constitutional: Positive for malaise/fatigue. Negative for chills, diaphoresis, fever and weight loss.  HENT: Negative for nosebleeds and sore throat.   Eyes: Negative for double vision.  Respiratory: Positive for shortness of breath. Negative for cough, hemoptysis, sputum production and wheezing.   Cardiovascular: Negative for chest pain, palpitations, orthopnea and leg swelling.  Gastrointestinal: Positive for blood in stool. Negative for abdominal pain, constipation, diarrhea, heartburn, melena, nausea and vomiting.  Genitourinary: Negative for dysuria, frequency and urgency.  Musculoskeletal: Positive for back pain and joint pain.  Skin: Negative.  Negative for itching and  rash.  Neurological: Negative for dizziness, tingling, focal weakness, weakness and headaches.  Endo/Heme/Allergies: Does not bruise/bleed easily.  Psychiatric/Behavioral: Negative for depression. The patient is not nervous/anxious and does not have insomnia.      MEDICAL HISTORY:  Past Medical History:  Diagnosis Date   Arthritis    LEFT KNEE    CAD (coronary artery disease)    COPD (chronic obstructive pulmonary disease) (HCC)    Depression    Emphysema lung (HCC)    GERD (gastroesophageal reflux disease)    Hernia, hiatal    Hyperlipidemia    Hypertension    Peripheral vascular disease ([place])    Pre-diabetes    Thrombosis    in Lt leg    SURGICAL HISTORY: Past Surgical History:  Procedure Laterality Date   ABDOMINAL AORTA STENT     ABDOMINAL HYSTERECTOMY     aortobifemoral bypass  2005   CESAREAN SECTION     x 3   COLONOSCOPY WITH PROPOFOL N/A 03/10/2019   Procedure: COLONOSCOPY WITH PROPOFOL;  Surgeon: Virgel Manifold, MD;  Location: [facility] ENDOSCOPY;  Service: Endoscopy;  Laterality: N/A;   ESOPHAGOGASTRODUODENOSCOPY (EGD) WITH PROPOFOL N/A 03/10/2019   Procedure: ESOPHAGOGASTRODUODENOSCOPY (EGD) WITH PROPOFOL;  Surgeon: Virgel Manifold, MD;  Location: [facility] ENDOSCOPY;  Service: Endoscopy;  Laterality: N/A;   EVALUATION UNDER ANESTHESIA WITH HEMORRHOIDECTOMY N/A 08/10/2020   Procedure: EXAM UNDER ANESTHESIA WITH HEMORRHOIDECTOMY;  Surgeon: Benjamine Sprague, DO;  Location: [facility] ORS;  Service: General;  Laterality: N/A;   femoral stents     LOWER EXTREMITY ANGIOGRAPHY Left 10/02/2016   Procedure: Lower Extremity Angiography;  Surgeon: Algernon Huxley, MD;  Location: [place] [facility];  Service: Cardiovascular;  Laterality: Left;   LOWER EXTREMITY ANGIOGRAPHY N/A 10/03/2016   Procedure: Lower Extremity Angiography with possible revascularization;  Surgeon: Algernon Huxley, MD;  Location: [place] [facility];  Service: Cardiovascular;  Laterality: N/A;    VASCULAR SURGERY      SOCIAL HISTORY: Social History   Socioeconomic History   Marital status: Widowed    Spouse name: Not on file   Number of children: Not on file   Years of education: Not on file   Highest education level: Not on file  Occupational History   Not on file  Tobacco Use   Smoking status: Current Every Day Smoker    Packs/day: 0.50    Years: 60.00    Pack years: 30.00    Types: Cigarettes   Smokeless tobacco: Never Used  Vaping Use   Vaping Use: Never used  Substance and Sexual Activity   Alcohol use: No   Drug use: Yes    Types: Marijuana    Comment: 2-3 times a week- smoked last night 08/09/20   Sexual activity: Never  Other Topics Concern   Not on file  Social History Narrative   Lives in [place]. Smokes-1 pck in 3 days. No alcohol. Worked in [employer]. Son with her son.    Social Determinants of Health   Financial Resource Strain: Not on file  Food Insecurity: Not on file  Transportation Needs: Not on file  Physical Activity: Not on file  Stress: Not on file  Social Connections: Not on file  Intimate Partner Violence: Not on file    FAMILY HISTORY: Family History  Problem Relation Age of Onset   Osteoporosis Mother    Alzheimer's disease Mother    Colon cancer Daughter 27    ALLERGIES:  is allergic to codeine and metformin and related.  MEDICATIONS:  Current Outpatient Medications  Medication Sig Dispense Refill   acetaminophen (TYLENOL) 325 MG tablet Take 2 tablets (650 mg total) by mouth every 8 (eight) hours as needed for mild pain. 40 tablet 0   albuterol (VENTOLIN HFA) 108 (90 Base) MCG/ACT inhaler Inhale 2 puffs into the lungs every 6 (six) hours as needed for wheezing or shortness of breath.     apixaban (ELIQUIS) 5 MG TABS tablet Take 1 tablet (5 mg total) by mouth 2 (two) times daily. 90 tablet 4   aspirin EC 81 MG EC tablet Take 1 tablet (81 mg total) by mouth daily. (Patient taking differently: Take 81 mg by  mouth at bedtime.) 30 tablet 5   cholecalciferol (VITAMIN D) 1000 units tablet Take 1,000 Units by mouth 2 (two) times daily.     diphenhydrAMINE (BENADRYL) 25 MG tablet Take 50 mg by mouth at bedtime.     docusate sodium (COLACE) 100 MG capsule Take 200 mg by mouth at bedtime.     ezetimibe-simvastatin (VYTORIN) 10-40 MG tablet Take 1 tablet by mouth daily.     Fluticasone-Salmeterol (ADVAIR) 250-50 MCG/DOSE AEPB Inhale 1 puff into the lungs 2 (two) times daily as needed (shortness of breath).     hydrocortisone 2.5 % cream Apply 1 application topically 2 (two) times daily as needed (itching).     ibuprofen (ADVIL) 400 MG tablet Take 1 tablet (400 mg total) by mouth every 8 (eight) hours as needed for mild pain or moderate pain. 30 tablet 0   lidocaine-prilocaine (EMLA) cream Apply 30 -45 mins prior to port access. [place]  g 0   lisinopril (ZESTRIL) 20 MG tablet Take 20 mg by mouth daily.     nortriptyline (PAMELOR) 10 MG capsule Take 10 mg by mouth daily.     omeprazole (PRILOSEC) 20 MG capsule Take 20 mg by mouth daily.     ondansetron (ZOFRAN) 8 MG tablet One pill every 8 hours as needed for nausea/vomitting. 40 tablet 1   prochlorperazine (COMPAZINE) 10 MG tablet Take 1 tablet (10 mg total) by mouth every 6 (six) hours as needed for nausea or vomiting. 40 tablet 1   sertraline (ZOLOFT) 100 MG tablet Take 100 mg by mouth 2 (two) times daily.     lidocaine (XYLOCAINE) 5 % ointment Apply 1 application topically every evening. 35.44 g 3   No current facility-administered medications for this visit.      Marland Kitchen  PHYSICAL EXAMINATION: ECOG PERFORMANCE STATUS: 1 - Symptomatic but completely ambulatory  Vitals:   09/05/20 1020  BP: (!) 106/52  Pulse: 81  Resp: 16  Temp: 98.8 F (37.1 C)  SpO2: 100%   Filed Weights   09/05/20 1020  Weight: 150 lb (68 kg)    Physical Exam Constitutional:      Comments: With son. Ambulating.   HENT:     Head: Normocephalic and atraumatic.      Mouth/Throat:     Pharynx: No oropharyngeal exudate.  Eyes:     Pupils: Pupils are equal, round, and reactive to light.  Cardiovascular:     Rate and Rhythm: Normal rate and regular rhythm.  Pulmonary:     Effort: No respiratory distress.     Breath sounds: No wheezing.     Comments: Decreased breath sounds at bases.  Abdominal:     General: Bowel sounds are normal. There is no distension.     Palpations: Abdomen is soft. There is no mass.     Tenderness: There is no abdominal tenderness. There is no guarding or rebound.  Musculoskeletal:        General: No tenderness. Normal range of motion.     Cervical back: Normal range of motion and neck supple.  Skin:    General: Skin is warm.  Neurological:     Mental Status: She is alert and oriented to person, place, and time.  Psychiatric:        Mood and Affect: Affect normal.      LABORATORY DATA:  I have reviewed the data as listed Lab Results  Component Value Date   WBC 9.6 08/18/2020   HGB 11.5 (L) 08/18/2020   HCT 34.9 (L) 08/18/2020   MCV 82.9 08/18/2020   PLT 237 08/18/2020   Recent Labs    08/08/20 0922 08/18/20 1442  NA 137 136  K 4.6 3.9  CL 103 101  CO2 27 22  GLUCOSE 103* 127*  BUN 19 13  CREATININE 0.99 1.12*  CALCIUM 9.5 9.1  GFRNONAA >60 53*  PROT  --  7.4  ALBUMIN  --  3.8  AST  --  21  ALT  --  17  ALKPHOS  --  77  BILITOT  --  0.4    RADIOGRAPHIC STUDIES: I have personally reviewed the radiological images as listed and agreed with the findings in the report. NM PET Image Initial (PI) Skull Base To Thigh  Result Date: 09/05/2020 CLINICAL DATA:  Initial treatment strategy for anal cancer. EXAM: NUCLEAR MEDICINE PET SKULL BASE TO THIGH TECHNIQUE: 7.8 mCi F-18 FDG was injected intravenously. Full-ring PET imaging was performed  from the skull base to thigh after the radiotracer. CT data was obtained and used for attenuation correction and anatomic localization. Fasting blood glucose: 104 mg/dl  COMPARISON:  None. FINDINGS: Mediastinal blood pool activity: SUV max 2.7 Liver activity: SUV max NA NECK: No hypermetabolic lymph nodes in the neck. Incidental CT findings: none CHEST: No hypermetabolic mediastinal or hilar nodes. No suspicious pulmonary nodules on the CT scan. Incidental CT findings: Coronary artery calcification is evident. Atherosclerotic calcification is noted in the wall of the thoracic aorta. Moderate hiatal hernia. Centrilobular emphsyema noted. 7 mm sub solid nodule in the right lower lobe appears similar to and was better characterized on lung cancer screening CT of 11/24/2019. ABDOMEN/PELVIS: No abnormal hypermetabolic activity within the liver, pancreas, adrenal glands, or spleen. No hypermetabolic lymph nodes in the abdomen. Intense hypermetabolism is identified in the region of the anus with SUV max = 14.3. Small lymph nodes are identified in both inguinal regions, measuring 7 mm short axis on the right (224/3) and 7 mm short axis on the left (227/3). Both of these lymph nodes show low level FDG accumulation with SUV max = 1.6. Incidental CT findings: Low-attenuation thickening of the left adrenal gland without hypermetabolism suggest hyperplasia. Sequelae of aortobifemoral bypass grafting evident. SKELETON: No focal hypermetabolic activity to suggest skeletal metastasis. Incidental CT findings: none IMPRESSION: 1. Marked hypermetabolism in the anal region consistent with the reported history of anal cancer. 2. Tiny bilateral inguinal lymph nodes show discernible FDG accumulation despite their tiny size. As anal lesions distal to the dentate line can drain to the inguinal nodes, correlation with lesion location recommended and close attention on follow-up as metastatic disease not excluded. 3. 7 mm sub solid nodule right lower lobe without substantial change since lung cancer screening CT 11/24/2019. No hypermetabolism on PET imaging today. 4.  Aortic Atherosclerois (ICD10-170.0) 5.   Emphysema. ([id number]) Electronically Signed   By: Misty Stanley M.D.   On: 09/05/2020 10:51    ASSESSMENT & PLAN:   Cancer of anal canal (HCC) #Anal cancer squamous cell-clinical T2N0. PET scan today- pending.  #Patient awaiting to start radiation on April 1st.  Plan start chemotherapy on 4/4- 5FU+Mitomycin.  Patient understands treatments are curative/definitive chemoradiation.   #Anal pain-recommend lidocaine gel for topical application prn; new refill given.   #COPD-on inhalers not on oxygen; STABLE.  Need to monitor closely on mitomycin.  #Peripheral vascular disease [Dr. Dew]-Eliquis. Awaiting port placement.  Discussed with Dr.Sakai.  Patient is aware that she should be holding her Eliquis 3 days prior.   # DISPOSITION:  # follow up TBD- Dr.B  Addendum: PET scan-uptake noted in the anal canal/tumor; slight uptake in bilateral inguinal region 7 mm lymph node noted.  Question significance.  Will discuss with Dr. Donella Stade; and also discuss at tumor conference.   All questions were answered. The patient knows to call the clinic with any problems, questions or concerns.    Cammie Sickle, MD 09/05/2020 7:19 PM

## 2020-09-05 NOTE — Telephone Encounter (Signed)
On 3/22- I tried to reach the pt's son to discuss the results of the PET scan- phone rings/ cannot LVM.   GB

## 2020-09-05 NOTE — Progress Notes (Signed)
START ON PATHWAY REGIMEN - Anal Carcinoma     One cycle, concurrent with RT:     Fluorouracil      Mitomycin   **Always confirm dose/schedule in your pharmacy ordering system**  Patient Characteristics: Anal Canal Tumors, Newly Diagnosed - Locoregional Disease (Clinical Staging) Therapeutic Status: Newly Diagnosed - Locoregional Disease (Clinical Staging) AJCC T Category: cT2 AJCC N Category: cN0 AJCC 8 Stage Grouping: IIA AJCC M Category: cM0 Intent of Therapy: Curative Intent, Discussed with Patient

## 2020-09-05 NOTE — Progress Notes (Signed)
Having pain in anal area. States it is burning.

## 2020-09-05 NOTE — Assessment & Plan Note (Addendum)
#  Anal cancer squamous cell-clinical T2N0. PET scan today- pending.  #Patient awaiting to start radiation on April 1st.  Plan start chemotherapy on 4/4- 5FU+Mitomycin.  Patient understands treatments are curative/definitive chemoradiation.   #Anal pain-recommend lidocaine gel for topical application prn; new refill given.   #COPD-on inhalers not on oxygen; STABLE.  Need to monitor closely on mitomycin.  #Peripheral vascular disease [Dr. Dew]-Eliquis. Awaiting port placement.  Discussed with Dr.Sakai.  Patient is aware that she should be holding her Eliquis 3 days prior.   # DISPOSITION:  # follow up TBD- Dr.B  Addendum: PET scan-uptake noted in the anal canal/tumor; slight uptake in bilateral inguinal region 7 mm lymph node noted.  Question significance.  Will discuss with Dr. Donella Stade; and also discuss at tumor conference.

## 2020-09-06 ENCOUNTER — Ambulatory Visit
Admission: RE | Admit: 2020-09-06 | Discharge: 2020-09-06 | Disposition: A | Discharge status: Home / Self Care | Payer: Medicare Other | Source: Ambulatory Visit | Attending: Radiation Oncology | Admitting: Radiation Oncology

## 2020-09-06 ENCOUNTER — Telehealth: Payer: Self-pay | Admitting: Internal Medicine

## 2020-09-06 ENCOUNTER — Other Ambulatory Visit: Payer: Self-pay

## 2020-09-06 ENCOUNTER — Other Ambulatory Visit: Payer: Self-pay | Admitting: *Deleted

## 2020-09-06 ENCOUNTER — Ambulatory Visit: Payer: Self-pay | Admitting: Surgery

## 2020-09-06 DIAGNOSIS — C211 Malignant neoplasm of anal canal: Secondary | ICD-10-CM

## 2020-09-06 NOTE — Research (Addendum)
Trial:  [employer] 2018/01  Patient [facility] was identified by Jeral Fruit, RN as a potential candidate for the above listed study.  This Clinical Research Nurse spoke with Mariah Rogers, [id number], on 09/06/20 in a manner and location that ensures patient privacy to discuss participation in the above listed research study via a telephone call.  The informed consent document and separate HIPAA Authorization was reviewed with the patient over the telephone.  Patient reads, speaks, and understands [place]. The patient voiced her understanding of all the protocol study procedures, risks and benefits.  Approximately 15 minutes was spent with the patient reviewing the informed consent documents.  Patient states she wants to participate in the study. The patient is agreeable to coming in early on 09/14/20 at 0900 am to review / sign the consent and Hipaa forms prior to having her blood drawn for the protocol. The patient understands that her blood needs to be taken prior to her having any treatment for her type of cancer.  Jeral Fruit, RN 09/06/20 10:35 AM  Second eligibility check completed, and patient is found to not be eligible due to anal cancer and not rectal cancer diagnosis. Patient will be notified of eligibility findings. Jeral Fruit, RN 09/06/20 11:47 AM   Patient was notified via telephone of her eligibility findings and informed she would not be eligible to participate in the study and the reason why. The patient was thanked for her willingness to participate.  Jeral Fruit, RN 09/07/20 3:21 PM

## 2020-09-06 NOTE — Telephone Encounter (Signed)
On 3/23-discussed with Dr. Donella Stade regarding the results of the PET scan-probably inguinal lymph node involvement.  However, treatment plan continues to be chemoradiation therapy.  We will also review the tumor conference on 3/31.   GB

## 2020-09-11 ENCOUNTER — Other Ambulatory Visit: Payer: Self-pay

## 2020-09-11 ENCOUNTER — Other Ambulatory Visit
Admission: RE | Admit: 2020-09-11 | Discharge: 2020-09-11 | Disposition: A | Discharge status: Home / Self Care | Payer: Medicare Other | Source: Ambulatory Visit | Attending: Surgery | Admitting: Surgery

## 2020-09-11 DIAGNOSIS — Z20822 Contact with and (suspected) exposure to covid-19: Secondary | ICD-10-CM | POA: Insufficient documentation

## 2020-09-11 DIAGNOSIS — C211 Malignant neoplasm of anal canal: Secondary | ICD-10-CM | POA: Diagnosis not present

## 2020-09-11 DIAGNOSIS — Z01812 Encounter for preprocedural laboratory examination: Secondary | ICD-10-CM | POA: Diagnosis present

## 2020-09-11 LAB — SARS CORONAVIRUS 2 (TAT 6-24 HRS): SARS Coronavirus 2: NEGATIVE

## 2020-09-11 NOTE — Patient Instructions (Signed)
Your procedure is scheduled on: 09/13/20 Report to [facility]. To find out your arrival time please call [phone] between 1PM - 3PM on 09/12/20.  Remember: Instructions that are not followed completely may result in serious medical risk, up to and including death, or upon the discretion of your surgeon and anesthesiologist your surgery may need to be rescheduled.     _X__ 1. Do not eat food after midnight the night before your procedure.                 No gum chewing or hard candies. You may drink clear liquids up to 2 hours                 before you are scheduled to arrive for your surgery- DO not drink clear                 liquids within 2 hours of the start of your surgery.                 Clear Liquids include:  water, apple juice without pulp, clear carbohydrate                 drink such as Clearfast or Gatorade, Black Coffee or Tea (Do not add                 anything to coffee or tea). Diabetics water only  __X__2.  On the morning of surgery brush your teeth with toothpaste and water, you                 may rinse your mouth with mouthwash if you wish.  Do not swallow any              toothpaste of mouthwash.     _X__ 3.  No Alcohol for 24 hours before or after surgery.   _X__ 4.  Do Not Smoke or use e-cigarettes For 24 Hours Prior to Your Surgery.                 Do not use any chewable tobacco products for at least 6 hours prior to                 surgery.  ____  5.  Bring all medications with you on the day of surgery if instructed.   __X__  6.  Notify your doctor if there is any change in your medical condition      (cold, fever, infections).     Do not wear jewelry, make-up, hairpins, clips or nail polish. Do not wear lotions, powders, or perfumes.  Do not shave 48 hours prior to surgery. Men may shave face and neck. Do not bring valuables to the hospital.    [facility] is not responsible for any belongings or  valuables.  Contacts, dentures/partials or body piercings may not be worn into surgery. Bring a case for your contacts, glasses or hearing aids, a denture cup will be supplied. Leave your suitcase in the car. After surgery it may be brought to your room. For patients admitted to the hospital, discharge time is determined by your treatment team.   Patients discharged the day of surgery will not be allowed to drive home.   Please read over the following fact sheets that you were given:   MRSA Information  __X__ Take these medicines the morning of surgery with A SIP OF WATER:  1. nortriptyline (PAMELOR) 10 MG capsule  2. omeprazole (PRILOSEC) 20 MG capsule  3. sertraline (ZOLOFT) 100 MG tablet  4.  5.  6.  ____ Fleet Enema (as directed)   __X__ Use CHG Soap/SAGE wipes as directed  __X__ Use inhalers on the day of surgery  ____ Stop metformin/Janumet/Farxiga 2 days prior to surgery    ____ Take 1/2 of usual insulin dose the night before surgery. No insulin the morning          of surgery.   __X__ Stop Blood Thinners Coumadin/Plavix/Xarelto/Pleta/Pradaxa/Eliquis/Effient/Aspirin  on   Or contact your Surgeon, Cardiologist or Medical Doctor regarding  ability to stop your blood thinners  (PT TO STOP ELIQUIS AND ASA 3 DAYS PRIOR)  __X__ Stop Anti-inflammatories 7 days before surgery such as Advil, Ibuprofen, Motrin,  BC or Goodies Powder, Naprosyn, Naproxen, Aleve, Aspirin    __X__ Stop all herbal supplements, fish oil or vitamin E until after surgery.    ____ Bring C-Pap to the hospital.     INSTRUCTIONS GIVEN VERBAL VIA TELEPHONE PT VERBALIZED UNDERSTANDING

## 2020-09-12 ENCOUNTER — Other Ambulatory Visit: Admission: RE | Admit: 2020-09-12 | Payer: Medicare Other | Source: Ambulatory Visit

## 2020-09-12 MED ORDER — ORAL CARE MOUTH RINSE: 15.0000 mL | Freq: Once | OROMUCOSAL | Status: AC

## 2020-09-12 MED ORDER — CEFAZOLIN SODIUM-DEXTROSE 2-4 GM/100ML-% IV SOLN
2.0000 g | INTRAVENOUS | Status: AC
  Administered 2020-09-13: 2 g via INTRAVENOUS

## 2020-09-12 MED ORDER — CHLORHEXIDINE GLUCONATE CLOTH 2 % EX PADS: 6.0000 | MEDICATED_PAD | Freq: Once | CUTANEOUS | Status: DC

## 2020-09-12 MED ORDER — LACTATED RINGERS IV SOLN: INTRAVENOUS | Status: DC

## 2020-09-12 MED ORDER — CHLORHEXIDINE GLUCONATE 0.12 % MT SOLN: 15.0000 mL | Freq: Once | OROMUCOSAL | Status: AC

## 2020-09-13 ENCOUNTER — Ambulatory Visit: Payer: Medicare Other | Admitting: Anesthesiology

## 2020-09-13 ENCOUNTER — Ambulatory Visit: Payer: Medicare Other

## 2020-09-13 ENCOUNTER — Encounter: Payer: Self-pay | Admitting: Surgery

## 2020-09-13 ENCOUNTER — Ambulatory Visit
Admission: RE | Admit: 2020-09-13 | Discharge: 2020-09-13 | Disposition: A | Discharge status: Home / Self Care | Payer: Medicare Other | Attending: Surgery | Admitting: Surgery

## 2020-09-13 ENCOUNTER — Encounter
Admission: RE | Disposition: A | Discharge status: Home / Self Care | Payer: Self-pay | Source: Home / Self Care | Attending: Surgery

## 2020-09-13 ENCOUNTER — Other Ambulatory Visit: Payer: Self-pay

## 2020-09-13 DIAGNOSIS — Z885 Allergy status to narcotic agent status: Secondary | ICD-10-CM | POA: Insufficient documentation

## 2020-09-13 DIAGNOSIS — Z7984 Long term (current) use of oral hypoglycemic drugs: Secondary | ICD-10-CM | POA: Diagnosis not present

## 2020-09-13 DIAGNOSIS — Z7951 Long term (current) use of inhaled steroids: Secondary | ICD-10-CM | POA: Insufficient documentation

## 2020-09-13 DIAGNOSIS — Z7982 Long term (current) use of aspirin: Secondary | ICD-10-CM | POA: Insufficient documentation

## 2020-09-13 DIAGNOSIS — F17200 Nicotine dependence, unspecified, uncomplicated: Secondary | ICD-10-CM | POA: Insufficient documentation

## 2020-09-13 DIAGNOSIS — J449 Chronic obstructive pulmonary disease, unspecified: Secondary | ICD-10-CM | POA: Diagnosis not present

## 2020-09-13 DIAGNOSIS — C210 Malignant neoplasm of anus, unspecified: Secondary | ICD-10-CM | POA: Insufficient documentation

## 2020-09-13 DIAGNOSIS — E1151 Type 2 diabetes mellitus with diabetic peripheral angiopathy without gangrene: Secondary | ICD-10-CM | POA: Diagnosis not present

## 2020-09-13 DIAGNOSIS — E785 Hyperlipidemia, unspecified: Secondary | ICD-10-CM | POA: Insufficient documentation

## 2020-09-13 DIAGNOSIS — I10 Essential (primary) hypertension: Secondary | ICD-10-CM | POA: Insufficient documentation

## 2020-09-13 DIAGNOSIS — C211 Malignant neoplasm of anal canal: Secondary | ICD-10-CM

## 2020-09-13 DIAGNOSIS — Z95828 Presence of other vascular implants and grafts: Secondary | ICD-10-CM

## 2020-09-13 DIAGNOSIS — Z791 Long term (current) use of non-steroidal anti-inflammatories (NSAID): Secondary | ICD-10-CM | POA: Diagnosis not present

## 2020-09-13 DIAGNOSIS — Z7901 Long term (current) use of anticoagulants: Secondary | ICD-10-CM | POA: Insufficient documentation

## 2020-09-13 HISTORY — PX: PORTACATH PLACEMENT: SHX2246

## 2020-09-13 SURGERY — INSERTION, TUNNELED CENTRAL VENOUS DEVICE, WITH PORT
Anesthesia: General | Site: Chest

## 2020-09-13 MED ORDER — FENTANYL CITRATE (PF) 100 MCG/2ML IJ SOLN
INTRAMUSCULAR | Status: DC | PRN
  Administered 2020-09-13 (×2): 25 ug via INTRAVENOUS
  Administered 2020-09-13: 50 ug via INTRAVENOUS

## 2020-09-13 MED ORDER — MIDAZOLAM HCL 2 MG/2ML IJ SOLN
INTRAMUSCULAR | Status: DC | PRN
  Administered 2020-09-13 (×2): 1 mg via INTRAVENOUS

## 2020-09-13 MED ORDER — DEXAMETHASONE SODIUM PHOSPHATE 10 MG/ML IJ SOLN
INTRAMUSCULAR | Status: DC | PRN
  Administered 2020-09-13: 10 mg via INTRAVENOUS

## 2020-09-13 MED ORDER — DOCUSATE SODIUM 100 MG PO CAPS: 100.0000 mg | ORAL_CAPSULE | Freq: Two times a day (BID) | ORAL | 0 refills | Status: AC | PRN

## 2020-09-13 MED ORDER — MIDAZOLAM HCL 2 MG/2ML IJ SOLN
INTRAMUSCULAR | Status: AC
  Filled 2020-09-13: qty 2

## 2020-09-13 MED ORDER — ONDANSETRON HCL 4 MG/2ML IJ SOLN: 4.0000 mg | Freq: Once | INTRAMUSCULAR | Status: DC | PRN

## 2020-09-13 MED ORDER — LIDOCAINE HCL 1 % IJ SOLN
INTRAMUSCULAR | Status: DC | PRN
  Administered 2020-09-13: 4 mL

## 2020-09-13 MED ORDER — HEPARIN SOD (PORK) LOCK FLUSH 100 UNIT/ML IV SOLN
INTRAVENOUS | Status: DC | PRN
  Administered 2020-09-13: 500 [IU]

## 2020-09-13 MED ORDER — PROPOFOL 500 MG/50ML IV EMUL
INTRAVENOUS | Status: AC
  Filled 2020-09-13: qty 50

## 2020-09-13 MED ORDER — EPHEDRINE SULFATE 50 MG/ML IJ SOLN
INTRAMUSCULAR | Status: DC | PRN
  Administered 2020-09-13 (×4): 10 mg via INTRAVENOUS

## 2020-09-13 MED ORDER — BUPIVACAINE HCL (PF) 0.5 % IJ SOLN
INTRAMUSCULAR | Status: AC
  Filled 2020-09-13: qty 30

## 2020-09-13 MED ORDER — FENTANYL CITRATE (PF) 100 MCG/2ML IJ SOLN: 25.0000 ug | INTRAMUSCULAR | Status: DC | PRN

## 2020-09-13 MED ORDER — HEPARIN SOD (PORK) LOCK FLUSH 100 UNIT/ML IV SOLN
INTRAVENOUS | Status: AC
  Filled 2020-09-13: qty 5

## 2020-09-13 MED ORDER — BUPIVACAINE HCL (PF) 0.5 % IJ SOLN
INTRAMUSCULAR | Status: DC | PRN
  Administered 2020-09-13: 4 mL

## 2020-09-13 MED ORDER — LIDOCAINE HCL (CARDIAC) PF 100 MG/5ML IV SOSY
PREFILLED_SYRINGE | INTRAVENOUS | Status: DC | PRN
  Administered 2020-09-13: 80 mg via INTRAVENOUS

## 2020-09-13 MED ORDER — PHENYLEPHRINE HCL (PRESSORS) 10 MG/ML IV SOLN
INTRAVENOUS | Status: DC | PRN
  Administered 2020-09-13: 100 ug via INTRAVENOUS

## 2020-09-13 MED ORDER — FENTANYL CITRATE (PF) 100 MCG/2ML IJ SOLN
INTRAMUSCULAR | Status: AC
  Filled 2020-09-13: qty 2

## 2020-09-13 MED ORDER — IPRATROPIUM-ALBUTEROL 0.5-2.5 (3) MG/3ML IN SOLN
RESPIRATORY_TRACT | Status: AC
  Filled 2020-09-13: qty 3

## 2020-09-13 MED ORDER — LACTATED RINGERS IV SOLN: INTRAVENOUS | Status: DC | PRN

## 2020-09-13 MED ORDER — FAMOTIDINE 20 MG PO TABS
ORAL_TABLET | ORAL | Status: AC
  Administered 2020-09-13: 20 mg
  Filled 2020-09-13: qty 1

## 2020-09-13 MED ORDER — ONDANSETRON HCL 4 MG/2ML IJ SOLN
INTRAMUSCULAR | Status: DC | PRN
  Administered 2020-09-13: 4 mg via INTRAVENOUS

## 2020-09-13 MED ORDER — CHLORHEXIDINE GLUCONATE 0.12 % MT SOLN
OROMUCOSAL | Status: AC
  Administered 2020-09-13: 15 mL via OROMUCOSAL
  Filled 2020-09-13: qty 15

## 2020-09-13 MED ORDER — CEFAZOLIN SODIUM-DEXTROSE 2-4 GM/100ML-% IV SOLN
INTRAVENOUS | Status: AC
  Filled 2020-09-13: qty 100

## 2020-09-13 MED ORDER — PROPOFOL 10 MG/ML IV BOLUS
INTRAVENOUS | Status: DC | PRN
  Administered 2020-09-13: 100 mg via INTRAVENOUS
  Administered 2020-09-13: 30 mg via INTRAVENOUS

## 2020-09-13 MED ORDER — ACETAMINOPHEN 325 MG PO TABS: 650.0000 mg | ORAL_TABLET | Freq: Three times a day (TID) | ORAL | 0 refills | Status: AC | PRN

## 2020-09-13 MED ORDER — IPRATROPIUM-ALBUTEROL 0.5-2.5 (3) MG/3ML IN SOLN
3.0000 mL | Freq: Once | RESPIRATORY_TRACT | Status: AC
  Administered 2020-09-13: 3 mL via RESPIRATORY_TRACT

## 2020-09-13 SURGICAL SUPPLY — 37 items
ADH SKN CLS APL DERMABOND .7 (GAUZE/BANDAGES/DRESSINGS) ×1
APL PRP STRL LF DISP 70% ISPRP (MISCELLANEOUS) ×1
APL SKNCLS STERI-STRIP NONHPOA (GAUZE/BANDAGES/DRESSINGS)
BAG DECANTER FOR FLEXI CONT (MISCELLANEOUS) ×2 IMPLANT
BENZOIN TINCTURE PRP APPL 2/3 (GAUZE/BANDAGES/DRESSINGS) IMPLANT
BLADE SURG SZ11 CARB STEEL (BLADE) ×2 IMPLANT
BOOT SUTURE AID YELLOW STND (SUTURE) ×2 IMPLANT
CANISTER SUCT 1200ML W/VALVE (MISCELLANEOUS) ×2 IMPLANT
CHLORAPREP W/TINT 26 (MISCELLANEOUS) ×2 IMPLANT
COVER LIGHT HANDLE STERIS (MISCELLANEOUS) ×4 IMPLANT
COVER WAND RF STERILE (DRAPES) ×2 IMPLANT
DECANTER SPIKE VIAL GLASS SM (MISCELLANEOUS) ×2 IMPLANT
DERMABOND ADVANCED (GAUZE/BANDAGES/DRESSINGS) ×1
DERMABOND ADVANCED .7 DNX12 (GAUZE/BANDAGES/DRESSINGS) ×1 IMPLANT
DRAPE C-ARM XRAY 36X54 (DRAPES) ×2 IMPLANT
DRSG TEGADERM 4X4.75 (GAUZE/BANDAGES/DRESSINGS) ×2 IMPLANT
ELECT CAUTERY BLADE 6.4 (BLADE) ×2 IMPLANT
ELECT REM PT RETURN 9FT ADLT (ELECTROSURGICAL) ×2
ELECTRODE REM PT RTRN 9FT ADLT (ELECTROSURGICAL) ×1 IMPLANT
GLOVE SURG SYN 6.5 ES PF (GLOVE) ×8 IMPLANT
GLOVE SURG UNDER POLY LF SZ7 (GLOVE) ×2 IMPLANT
GOWN STRL REUS W/ TWL LRG LVL3 (GOWN DISPOSABLE) ×2 IMPLANT
GOWN STRL REUS W/TWL LRG LVL3 (GOWN DISPOSABLE) ×4
IV NS 500ML (IV SOLUTION) ×2
IV NS 500ML BAXH (IV SOLUTION) ×1 IMPLANT
KIT PORT POWER 8FR ISP CVUE (Port) ×2 IMPLANT
KIT TURNOVER KIT A (KITS) ×2 IMPLANT
LABEL OR SOLS (LABEL) ×2 IMPLANT
MANIFOLD NEPTUNE II (INSTRUMENTS) ×2 IMPLANT
PACK PORT-A-CATH (MISCELLANEOUS) ×2 IMPLANT
SPONGE VERSALON 4X4 4PLY (MISCELLANEOUS) IMPLANT
STRIP CLOSURE SKIN 1/2X4 (GAUZE/BANDAGES/DRESSINGS) ×2 IMPLANT
SUT MNCRL AB 4-0 PS2 18 (SUTURE) ×2 IMPLANT
SUT PROLENE 2 0 SH DA (SUTURE) ×2 IMPLANT
SUT VIC AB 3-0 SH 27 (SUTURE) ×2
SUT VIC AB 3-0 SH 27X BRD (SUTURE) ×1 IMPLANT
SYR 10ML LL (SYRINGE) ×2 IMPLANT

## 2020-09-13 NOTE — Op Note (Signed)
--  OP NOTE  DATE OF PROCEDURE: 09/13/2020   SURGEON: Lysle Pearl  ANESTHESIA: LMA  PRE-OPERATIVE DIAGNOSIS: anal cancer requring port for chemotherapy   POST-OPERATIVE DIAGNOSIS: same  PROCEDURE(S):  1.) Percutaneous access of RIJ vein under ultrasound guidance   2.) Insertion of tunneled RIJ central venous catheter with subcutaneous port  INTRAOPERATIVE FINDINGS: Patent easily compressible RIJ vein with appropriate respiratory variations and well-secured tunneled central venous catheter with subcutaneous port at completion of the procedure, heplocked after confirming ease of draw and push  ESTIMATED BLOOD LOSS: Minimal (<20 mL)   SPECIMENS: None   IMPLANTS: 80F tunneled Bard PowerPort central venous catheter with subcutaneous port  DRAINS: None   COMPLICATIONS: None apparent   CONDITION AT COMPLETION: Hemodynamically stable, awake   DISPOSITION: PACU   INDICATION(S) FOR PROCEDURE:  Patient is a 70 y.o. female who presented with above diagnosis.  All risks, benefits, and alternatives to above elective procedures were discussed with the patient, who elected to proceed, and informed consent was accordingly obtained at that time.  DETAILS OF PROCEDURE:  Patient was brought to the operative suite and appropriately identified. In Trendelenburg position, RIGHT  IJ venous access site was prepped and draped in the usual sterile fashion, and following a timeout, percutaneous venous access was obtained under ultrasound guidance using Seldinger technique, by which local anesthetic was injected over the area, and access needle was inserted under direct ultrasound visualization through which soft guidewire was advanced with no resistance, over which access needle was withdrawn. Guidewire was secured, attention was directed to injection of local anesthetic along the planned tunnel site, 2-3 cm transverse RIGHT chest incision was made and confirmed to accommodate the subcutaneous port, and flushed  catheter was tunneled retrograde from the port site over to the vein access site. Insertion sheath was advanced over the guidewire, which was withdrawn along with the insertion sheath dilator with no resistance. The catheter was introduced through the sheath and tip left in the [place] junction under fluoro guidance and catheter cut to appropriate length.  Catheter connected to port and placed within planned fixation site.  Fluro confirmed no kink within the entire length of the catheter at this point.  Port fixed to the pocket on two side to avoid twisting. Port was confirmed to withdraw blood and flush easily with included Heuber needle, and port heplocked. Dermis at the subcutaneous pocket was re-approximated using buried interrupted 3-0 Vicryl suture, and 4-0 Monocryl suture was used to re-approximate skin at the insertion and subcutaneous port sites in running subcuticular fashion.  Incisions then dressed with dermabond. Patient was then awakened from anesthesia and transferred to PACU in stable condition.  [place] images saved in paper chart and fluoro images saved in Epic.  CXR post op confirmed proper placement of port and no evidence of pneumothorax.

## 2020-09-13 NOTE — Discharge Instructions (Signed)
Port placement, Care After This sheet gives you information about how to care for yourself after your procedure. Your health care provider may also give you more specific instructions. If you have problems or questions, contact your health care provider. What can I expect after the procedure? After the procedure, it is common to have:  Soreness.  Bruising.  Itching. Follow these instructions at home: site care Follow instructions from your health care provider about how to take care of your site. Make sure you:  Wash your hands with soap and water before and after you change your bandage (dressing). If soap and water are not available, use hand sanitizer.  Leave stitches (sutures), skin glue, or adhesive strips in place. These skin closures may need to stay in place for 2 weeks or longer. If adhesive strip edges start to loosen and curl up, you may trim the loose edges. Do not remove adhesive strips completely unless your health care provider tells you to do that.  If the area bleeds or bruises, apply gentle pressure for 10 minutes.  OK TO SHOWER IN 24HRS  Check your site every day for signs of infection. Check for:  Redness, swelling, or pain.  Fluid or blood.  Warmth.  Pus or a bad smell.  General instructions  Rest and then return to your normal activities as told by your health care provider. .  tylenol and advil as needed for discomfort.  Please alternate between the two every four hours as needed for pain.   .  Use narcotics, if prescribed, only when tylenol and motrin is not enough to control pain. .  325-650mg every 8hrs to max of 3000mg/24hrs (including the 325mg in every norco dose) for the tylenol.   .  Advil up to 800mg per dose every 8hrs as needed for pain.    Keep all follow-up visits as told by your health care provider. This is important. Contact a health care provider if:  You have redness, swelling, or pain around your site.  You have fluid or blood  coming from your site.  Your site feels warm to the touch.  You have pus or a bad smell coming from your site.  You have a fever.  Your sutures, skin glue, or adhesive strips loosen or come off sooner than expected. Get help right away if:  You have bleeding that does not stop with pressure or a dressing. Summary  After the procedure, it is common to have some soreness, bruising, and itching at the site.  Follow instructions from your health care provider about how to take care of your site.  Check your site every day for signs of infection.  Contact a health care provider if you have redness, swelling, or pain around your site, or your site feels warm to the touch.  Keep all follow-up visits as told by your health care provider. This is important. This information is not intended to replace advice given to you by your health care provider. Make sure you discuss any questions you have with your health care provider. Document Released: 06/30/2015 Document Revised: 12/01/2017 Document Reviewed: 12/01/2017 Elsevier Interactive Patient Education  2019 [employer]   AMBULATORY SURGERY  DISCHARGE INSTRUCTIONS   1) The drugs that you were given will stay in your system until tomorrow so for the next 24 hours you should not:  A) Drive an automobile B) Make any legal decisions C) Drink any alcoholic beverage   2) You may resume regular meals tomorrow.    Today it is better to start with liquids and gradually work up to solid foods.  You may eat anything you prefer, but it is better to start with liquids, then soup and crackers, and gradually work up to solid foods.   3) Please notify your doctor immediately if you have any unusual bleeding, trouble breathing, redness and pain at the surgery site, drainage, fever, or pain not relieved by medication.    4) Additional Instructions:        Please contact your physician with any problems or Same Day Surgery at [phone],  Monday through Friday 6 am to 4 pm, or [facility] Main number at [phone].   

## 2020-09-13 NOTE — Transfer of Care (Signed)
Immediate Anesthesia Transfer of Care Note  Patient: Mariah Rogers  Procedure(s) Performed: INSERTION PORT-A-CATH (N/A Chest)  Patient Location: PACU  Anesthesia Type:General  Level of Consciousness: sedated  Airway & Oxygen Therapy: Patient Spontanous Breathing and Patient connected to face mask oxygen  Post-op Assessment: Report given to RN and Post -op Vital signs reviewed and stable  Post vital signs: Reviewed and stable  Last Vitals:  Vitals Value Taken Time  BP 133/59 09/13/20 1545  Temp 36.8 C 09/13/20 1539  Pulse 83 09/13/20 1553  Resp 23 09/13/20 1553  SpO2 92 % 09/13/20 1553  Vitals shown include unvalidated device data.  Last Pain:  Vitals:   09/13/20 1545  TempSrc:   PainSc: 0-No pain      Patients Stated Pain Goal: 0 (78/41/28 2081)  Complications: No complications documented.

## 2020-09-13 NOTE — Anesthesia Procedure Notes (Signed)
Procedure Name: LMA Insertion Date/Time: 09/13/2020 2:28 PM Performed by: Justus Memory, CRNA Pre-anesthesia Checklist: Patient identified, Patient being monitored, Timeout performed, Emergency Drugs available and Suction available Patient Re-evaluated:Patient Re-evaluated prior to induction Oxygen Delivery Method: Circle system utilized Preoxygenation: Pre-oxygenation with 100% oxygen Induction Type: IV induction Ventilation: Mask ventilation without difficulty LMA: LMA inserted LMA Size: 3.5 Tube type: Oral Number of attempts: 1 Placement Confirmation: positive ETCO2 and breath sounds checked- equal and bilateral Tube secured with: Tape Dental Injury: Teeth and Oropharynx as per pre-operative assessment

## 2020-09-13 NOTE — Anesthesia Preprocedure Evaluation (Signed)
Anesthesia Evaluation  Patient identified by MRN, date of birth, ID band Patient awake    Reviewed: Allergy & Precautions, H&P , NPO status , Patient's Chart, lab work & pertinent test results, reviewed documented beta blocker date and time   Airway Mallampati: II  TM Distance: >3 FB Neck ROM: full    Dental  (+) Teeth Intact   Pulmonary COPD, Current Smoker,    Pulmonary exam normal        Cardiovascular Exercise Tolerance: Poor hypertension, On Medications + CAD and + Peripheral Vascular Disease  Normal cardiovascular exam Rate:Normal     Neuro/Psych PSYCHIATRIC DISORDERS Depression negative neurological ROS     GI/Hepatic Neg liver ROS, hiatal hernia, GERD  Medicated,  Endo/Other  negative endocrine ROS  Renal/GU negative Renal ROS  negative genitourinary   Musculoskeletal   Abdominal   Peds  Hematology negative hematology ROS (+)   Anesthesia Other Findings   Reproductive/Obstetrics negative OB ROS                             Anesthesia Physical Anesthesia Plan  ASA: III  Anesthesia Plan: General LMA   Post-op Pain Management:    Induction:   PONV Risk Score and Plan: 3  Airway Management Planned:   Additional Equipment:   Intra-op Plan:   Post-operative Plan:   Informed Consent: I have reviewed the patients History and Physical, chart, labs and discussed the procedure including the risks, benefits and alternatives for the proposed anesthesia with the patient or authorized representative who has indicated his/her understanding and acceptance.       Plan Discussed with: CRNA  Anesthesia Plan Comments:         Anesthesia Quick Evaluation

## 2020-09-14 ENCOUNTER — Ambulatory Visit: Admission: RE | Admit: 2020-09-14 | Payer: Medicare Other | Source: Ambulatory Visit

## 2020-09-14 ENCOUNTER — Encounter: Payer: Self-pay | Admitting: Surgery

## 2020-09-14 ENCOUNTER — Other Ambulatory Visit (INDEPENDENT_AMBULATORY_CARE_PROVIDER_SITE_OTHER): Payer: Self-pay | Admitting: Vascular Surgery

## 2020-09-14 ENCOUNTER — Other Ambulatory Visit: Payer: Medicare Other

## 2020-09-14 DIAGNOSIS — I739 Peripheral vascular disease, unspecified: Secondary | ICD-10-CM

## 2020-09-14 NOTE — Interval H&P Note (Signed)
History and Physical Interval Note:  09/14/2020 10:14 AM  Mariah Rogers  has presented today for surgery, with the diagnosis of Anal cancer  C21.0.  The various methods of treatment have been discussed with the patient and family. After consideration of risks, benefits and other options for treatment, the patient has consented to  Procedure(s): INSERTION PORT-A-CATH (N/A) as a surgical intervention.  The patient's history has been reviewed, patient examined, no change in status, stable for surgery.  I have reviewed the patient's chart and labs.  Questions were answered to the patient's satisfaction.     Adamariz Gillott Lysle Pearl

## 2020-09-14 NOTE — Progress Notes (Signed)
Tumor Board Documentation  Mariah Rogers was presented by Dr Rogue Bussing at our Tumor Board on 09/14/2020, which included representatives from medical oncology,radiation oncology,internal medicine,navigation,pathology,radiology,surgical,genetics,research,palliative care,pulmonology.  Mariah Rogers currently presents as a current patient,for progression with history of the following treatments: active survellience.  Additionally, we reviewed previous medical and familial history, history of present illness, and recent lab results along with all available histopathologic and imaging studies. The tumor board considered available treatment options and made the following recommendations: Concurrent chemo-radiation therapy    The following procedures/referrals were also placed: No orders of the defined types were placed in this encounter.   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  Tumor board is a meeting of clinicians from various specialty areas who evaluate and discuss patients for whom a multidisciplinary approach is being considered. Final determinations in the plan of care are those of the provider(s). The responsibility for follow up of recommendations given during tumor board is that of the provider.   Today's extended care, comprehensive team conference, [name] was not present for the discussion and was not examined.   Multidisciplinary Tumor Board is a multidisciplinary case peer review process.  Decisions discussed in the Multidisciplinary Tumor Board reflect the opinions of the specialists present at the conference without having examined the patient.  Ultimately, treatment and diagnostic decisions rest with the primary provider(s) and the patient.

## 2020-09-14 NOTE — Anesthesia Postprocedure Evaluation (Signed)
Anesthesia Post Note  Patient: Mariah Rogers  Procedure(s) Performed: INSERTION PORT-A-CATH (N/A Chest)  Patient location during evaluation: PACU Anesthesia Type: General Level of consciousness: awake and alert Pain management: pain level controlled Vital Signs Assessment: post-procedure vital signs reviewed and stable Respiratory status: spontaneous breathing, nonlabored ventilation, respiratory function stable and patient connected to nasal cannula oxygen Cardiovascular status: blood pressure returned to baseline and stable Postop Assessment: no apparent nausea or vomiting Anesthetic complications: no   No complications documented.   Last Vitals:  Vitals:   09/13/20 1645 09/13/20 1657  BP: 136/65 (!) 143/59  Pulse: 81 82  Resp: (!) 23 18  Temp: 36.6 C 36.8 C  SpO2: 91% 99%    Last Pain:  Vitals:   09/13/20 1657  TempSrc: Temporal  PainSc: 0-No pain                 Martha Clan

## 2020-09-15 ENCOUNTER — Encounter (INDEPENDENT_AMBULATORY_CARE_PROVIDER_SITE_OTHER): Payer: Medicare Other | Admitting: Vascular Surgery

## 2020-09-15 ENCOUNTER — Encounter (INDEPENDENT_AMBULATORY_CARE_PROVIDER_SITE_OTHER): Payer: Medicare Other

## 2020-09-15 ENCOUNTER — Telehealth: Payer: Self-pay | Admitting: *Deleted

## 2020-09-15 NOTE — Telephone Encounter (Signed)
[facility] Tueday/wed- MD; labs- cbc/cmp; 5FU+mitomycin (NEW)  apts sched and patient notified.

## 2020-09-18 ENCOUNTER — Ambulatory Visit
Admission: RE | Admit: 2020-09-18 | Discharge: 2020-09-18 | Disposition: A | Discharge status: Home / Self Care | Payer: Medicare Other | Source: Ambulatory Visit | Attending: Radiation Oncology | Admitting: Radiation Oncology

## 2020-09-18 DIAGNOSIS — C211 Malignant neoplasm of anal canal: Secondary | ICD-10-CM | POA: Insufficient documentation

## 2020-09-19 ENCOUNTER — Ambulatory Visit
Admission: RE | Admit: 2020-09-19 | Discharge: 2020-09-19 | Disposition: A | Discharge status: Home / Self Care | Payer: Medicare Other | Source: Ambulatory Visit | Attending: Radiation Oncology | Admitting: Radiation Oncology

## 2020-09-19 DIAGNOSIS — C211 Malignant neoplasm of anal canal: Secondary | ICD-10-CM | POA: Diagnosis not present

## 2020-09-20 ENCOUNTER — Inpatient Hospital Stay: Payer: Medicare Other

## 2020-09-20 ENCOUNTER — Inpatient Hospital Stay (HOSPITAL_BASED_OUTPATIENT_CLINIC_OR_DEPARTMENT_OTHER): Payer: Medicare Other | Admitting: Internal Medicine

## 2020-09-20 ENCOUNTER — Encounter: Payer: Self-pay | Admitting: Internal Medicine

## 2020-09-20 ENCOUNTER — Telehealth: Payer: Self-pay | Admitting: Internal Medicine

## 2020-09-20 ENCOUNTER — Inpatient Hospital Stay: Payer: Medicare Other | Attending: Internal Medicine

## 2020-09-20 ENCOUNTER — Ambulatory Visit
Admission: RE | Admit: 2020-09-20 | Discharge: 2020-09-20 | Disposition: A | Discharge status: Home / Self Care | Payer: Medicare Other | Source: Ambulatory Visit | Attending: Radiation Oncology | Admitting: Radiation Oncology

## 2020-09-20 DIAGNOSIS — R112 Nausea with vomiting, unspecified: Secondary | ICD-10-CM | POA: Diagnosis not present

## 2020-09-20 DIAGNOSIS — C211 Malignant neoplasm of anal canal: Secondary | ICD-10-CM | POA: Diagnosis not present

## 2020-09-20 DIAGNOSIS — F1290 Cannabis use, unspecified, uncomplicated: Secondary | ICD-10-CM | POA: Insufficient documentation

## 2020-09-20 DIAGNOSIS — R197 Diarrhea, unspecified: Secondary | ICD-10-CM | POA: Diagnosis not present

## 2020-09-20 DIAGNOSIS — Z800 Family history of malignant neoplasm of digestive organs: Secondary | ICD-10-CM | POA: Insufficient documentation

## 2020-09-20 DIAGNOSIS — Z90710 Acquired absence of both cervix and uterus: Secondary | ICD-10-CM | POA: Diagnosis not present

## 2020-09-20 DIAGNOSIS — Z5111 Encounter for antineoplastic chemotherapy: Secondary | ICD-10-CM | POA: Diagnosis present

## 2020-09-20 DIAGNOSIS — G893 Neoplasm related pain (acute) (chronic): Secondary | ICD-10-CM | POA: Diagnosis not present

## 2020-09-20 DIAGNOSIS — F17210 Nicotine dependence, cigarettes, uncomplicated: Secondary | ICD-10-CM | POA: Insufficient documentation

## 2020-09-20 DIAGNOSIS — J449 Chronic obstructive pulmonary disease, unspecified: Secondary | ICD-10-CM | POA: Diagnosis not present

## 2020-09-20 DIAGNOSIS — C210 Malignant neoplasm of anus, unspecified: Secondary | ICD-10-CM | POA: Insufficient documentation

## 2020-09-20 LAB — CBC WITH DIFFERENTIAL/PLATELET
Abs Immature Granulocytes: 0.04 10*3/uL (ref 0.00–0.07)
Basophils Absolute: 0 10*3/uL (ref 0.0–0.1)
Basophils Relative: 0 %
Eosinophils Absolute: 0.1 10*3/uL (ref 0.0–0.5)
Eosinophils Relative: 1 %
HCT: 37.6 % (ref 36.0–46.0)
Hemoglobin: 12.2 g/dL (ref 12.0–15.0)
Immature Granulocytes: 0 %
Lymphocytes Relative: 23 %
Lymphs Abs: 2.3 10*3/uL (ref 0.7–4.0)
MCH: 27.7 pg (ref 26.0–34.0)
MCHC: 32.4 g/dL (ref 30.0–36.0)
MCV: 85.3 fL (ref 80.0–100.0)
Monocytes Absolute: 0.4 10*3/uL (ref 0.1–1.0)
Monocytes Relative: 4 %
Neutro Abs: 7 10*3/uL (ref 1.7–7.7)
Neutrophils Relative %: 72 %
Platelets: 247 10*3/uL (ref 150–400)
RBC: 4.41 MIL/uL (ref 3.87–5.11)
RDW: 16.6 % — ABNORMAL HIGH (ref 11.5–15.5)
WBC: 9.9 10*3/uL (ref 4.0–10.5)
nRBC: 0 % (ref 0.0–0.2)

## 2020-09-20 LAB — COMPREHENSIVE METABOLIC PANEL
ALT: 21 U/L (ref 0–44)
AST: 25 U/L (ref 15–41)
Albumin: 3.9 g/dL (ref 3.5–5.0)
Alkaline Phosphatase: 73 U/L (ref 38–126)
Anion gap: 9 (ref 5–15)
BUN: 22 mg/dL (ref 8–23)
CO2: 26 mmol/L (ref 22–32)
Calcium: 9.7 mg/dL (ref 8.9–10.3)
Chloride: 102 mmol/L (ref 98–111)
Creatinine, Ser: 1.04 mg/dL — ABNORMAL HIGH (ref 0.44–1.00)
GFR, Estimated: 58 mL/min — ABNORMAL LOW (ref >60)
Glucose, Bld: 107 mg/dL — ABNORMAL HIGH (ref 70–99)
Potassium: 4.7 mmol/L (ref 3.5–5.1)
Sodium: 137 mmol/L (ref 135–145)
Total Bilirubin: 0.4 mg/dL (ref 0.3–1.2)
Total Protein: 7.8 g/dL (ref 6.5–8.1)

## 2020-09-20 MED ORDER — SODIUM CHLORIDE 0.9% FLUSH
10.0000 mL | INTRAVENOUS | Status: DC | PRN
  Administered 2020-09-20: 10 mL via INTRAVENOUS
  Filled 2020-09-20: qty 10

## 2020-09-20 MED ORDER — SODIUM CHLORIDE 0.9% FLUSH
10.0000 mL | Freq: Once | INTRAVENOUS | Status: DC
  Filled 2020-09-20: qty 10

## 2020-09-20 MED ORDER — HEPARIN SOD (PORK) LOCK FLUSH 100 UNIT/ML IV SOLN
500.0000 [IU] | Freq: Once | INTRAVENOUS | Status: DC
  Filled 2020-09-20: qty 5

## 2020-09-20 NOTE — Progress Notes (Signed)
Port-a-cath does not yield a blood return. Port-a-cath flushes without difficulty. No swelling, redness, or pain noted at port site. Patient had lab work drawn from a peripheral vein. MD, Dr. Rogue Bussing, notified verbally in person and aware.

## 2020-09-20 NOTE — Progress Notes (Signed)
[place] NOTE  Patient Care Team: Letta Median, MD as PCP - General (Family Medicine) Clent Jacks, RN as Oncology Nurse Navigator Benjamine Sprague, DO as Consulting Physician (Surgery) Cammie Sickle, MD as Consulting Physician (Internal Medicine)  CHIEF COMPLAINTS/PURPOSE OF CONSULTATION: anal cancer  #  Oncology History Overview Note  # [facility] 2022- ANAL CA- EXCISIONAL BIOPSY:  - INVASIVE MODERATELY DIFFERENTIATED SQUAMOUS CELL CARCINOMA, PRESENT AT DEEP AND LATERAL TISSUE EDGES. [Dr.Sakai]  # PVD [s/p Stent; Dr.Dew]on Eliquis; active smoker;   # SURVIVORSHIP:   # GENETICS:   DIAGNOSIS:   STAGE:         ;  GOALS:  CURRENT/MOST RECENT THERAPY :     Cancer of anal canal ([place])  08/18/2020 Initial Diagnosis   Cancer of anal canal ([place])   10/05/2020 -  Chemotherapy    Patient is on Treatment Plan: ANUS MITOMYCIN D1,28 / 5FU D1-4, 28-31 Q32D         HISTORY OF PRESENTING ILLNESS:  Mariah Rogers 70 y.o.  female history of COPD active smoking/diagnosed with anal cancer is here to review the results of the PET scan/and discuss treatment options.  In the interim patient has been evaluated by radiation oncology; start radiation on April 1.  In the interim patient had port placed with Dr. Lysle Pearl.  However this morning noted to have difficulty with blood draw.  Continues to have mild discomfort in the anal region.  Not any worse.  She is currently is using lidocaine.  Review of Systems  Constitutional: Positive for malaise/fatigue. Negative for chills, diaphoresis, fever and weight loss.  HENT: Negative for nosebleeds and sore throat.   Eyes: Negative for double vision.  Respiratory: Positive for shortness of breath. Negative for cough, hemoptysis, sputum production and wheezing.   Cardiovascular: Negative for chest pain, palpitations, orthopnea and leg swelling.  Gastrointestinal: Positive for blood in stool. Negative for  abdominal pain, constipation, diarrhea, heartburn, melena, nausea and vomiting.  Genitourinary: Negative for dysuria, frequency and urgency.  Musculoskeletal: Positive for back pain and joint pain.  Skin: Negative.  Negative for itching and rash.  Neurological: Negative for dizziness, tingling, focal weakness, weakness and headaches.  Endo/Heme/Allergies: Does not bruise/bleed easily.  Psychiatric/Behavioral: Negative for depression. The patient is not nervous/anxious and does not have insomnia.      MEDICAL HISTORY:  Past Medical History:  Diagnosis Date  . Arthritis    LEFT KNEE   . CAD (coronary artery disease)   . COPD (chronic obstructive pulmonary disease) ([place])   . Depression   . Emphysema lung ([place])   . GERD (gastroesophageal reflux disease)   . Hernia, hiatal   . Hyperlipidemia   . Hypertension   . Peripheral vascular disease ([place])   . Pre-diabetes   . Thrombosis    in Lt leg    SURGICAL HISTORY: Past Surgical History:  Procedure Laterality Date  . ABDOMINAL AORTA STENT    . ABDOMINAL HYSTERECTOMY    . aortobifemoral bypass  2005  . CESAREAN SECTION     x 3  . COLONOSCOPY WITH PROPOFOL N/A 03/10/2019   Procedure: COLONOSCOPY WITH PROPOFOL;  Surgeon: Virgel Manifold, MD;  Location: [facility] ENDOSCOPY;  Service: Endoscopy;  Laterality: N/A;  . ESOPHAGOGASTRODUODENOSCOPY (EGD) WITH PROPOFOL N/A 03/10/2019   Procedure: ESOPHAGOGASTRODUODENOSCOPY (EGD) WITH PROPOFOL;  Surgeon: Virgel Manifold, MD;  Location: [facility] ENDOSCOPY;  Service: Endoscopy;  Laterality: N/A;  . EVALUATION UNDER ANESTHESIA WITH HEMORRHOIDECTOMY N/A 08/10/2020  Procedure: EXAM UNDER ANESTHESIA WITH HEMORRHOIDECTOMY;  Surgeon: Benjamine Sprague, DO;  Location: [facility] ORS;  Service: General;  Laterality: N/A;  . femoral stents    . LOWER EXTREMITY ANGIOGRAPHY Left 10/02/2016   Procedure: Lower Extremity Angiography;  Surgeon: Algernon Huxley, MD;  Location: [place] [facility];  Service: Cardiovascular;   Laterality: Left;  . LOWER EXTREMITY ANGIOGRAPHY N/A 10/03/2016   Procedure: Lower Extremity Angiography with possible revascularization;  Surgeon: Algernon Huxley, MD;  Location: [place] [facility];  Service: Cardiovascular;  Laterality: N/A;  . PORTACATH PLACEMENT N/A 09/13/2020   Procedure: INSERTION PORT-A-CATH;  Surgeon: Benjamine Sprague, DO;  Location: [facility] ORS;  Service: General;  Laterality: N/A;  . VASCULAR SURGERY      SOCIAL HISTORY: Social History   Socioeconomic History  . Marital status: Widowed    Spouse name: Not on file  . Number of children: Not on file  . Years of education: Not on file  . Highest education level: Not on file  Occupational History  . Not on file  Tobacco Use  . Smoking status: Current Every Day Smoker    Packs/day: 0.25    Years: 60.00    Pack years: 15.00    Types: Cigarettes  . Smokeless tobacco: Never Used  Vaping Use  . Vaping Use: Never used  Substance and Sexual Activity  . Alcohol use: No  . Drug use: Yes    Types: Marijuana    Comment: 2-3 times a week- smoked last night 08/09/20  . Sexual activity: Never  Other Topics Concern  . Not on file  Social History Narrative   Lives in [place]. Smokes-1 pck in 3 days. No alcohol. Worked in [employer]. Son with her son.    Social Determinants of Health   Financial Resource Strain: Not on file  Food Insecurity: Not on file  Transportation Needs: Not on file  Physical Activity: Not on file  Stress: Not on file  Social Connections: Not on file  Intimate Partner Violence: Not on file    FAMILY HISTORY: Family History  Problem Relation Age of Onset  . Osteoporosis Mother   . Alzheimer's disease Mother   . Colon cancer Daughter 9    ALLERGIES:  is allergic to codeine and metformin and related.  MEDICATIONS:  Current Outpatient Medications  Medication Sig Dispense Refill  . acetaminophen (TYLENOL) 325 MG tablet Take 2 tablets (650 mg total) by mouth every 8 (eight) hours as needed for  mild pain. 40 tablet 0  . albuterol (VENTOLIN HFA) 108 (90 Base) MCG/ACT inhaler Inhale 2 puffs into the lungs every 6 (six) hours as needed for wheezing or shortness of breath.    Marland Kitchen apixaban (ELIQUIS) 5 MG TABS tablet Take 1 tablet (5 mg total) by mouth 2 (two) times daily. 90 tablet 4  . aspirin EC 81 MG EC tablet Take 1 tablet (81 mg total) by mouth daily. (Patient taking differently: Take 81 mg by mouth at bedtime.) 30 tablet 5  . cholecalciferol (VITAMIN D) 1000 units tablet Take 1,000 Units by mouth 2 (two) times daily.    . diphenhydrAMINE (BENADRYL) 25 MG tablet Take 50 mg by mouth at bedtime. Sleepaid    . docusate sodium (COLACE) 100 MG capsule Take 1 capsule (100 mg total) by mouth 2 (two) times daily as needed for up to 10 days for mild constipation. 20 capsule 0  . docusate sodium (COLACE) 250 MG capsule Take 250 mg by mouth at bedtime.    Marland Kitchen  ezetimibe-simvastatin (VYTORIN) 10-40 MG tablet Take 1 tablet by mouth at bedtime.    . Fluticasone-Salmeterol (ADVAIR) 250-50 MCG/DOSE AEPB Inhale 1 puff into the lungs daily.    . hydrocortisone 2.5 % cream Apply 1 application topically 2 (two) times daily as needed (itching).    Marland Kitchen ibuprofen (ADVIL) 400 MG tablet Take 1 tablet (400 mg total) by mouth every 8 (eight) hours as needed for mild pain or moderate pain. 30 tablet 0  . lidocaine (XYLOCAINE) 5 % ointment Apply 1 application topically every evening. 35.44 g 3  . lidocaine-prilocaine (EMLA) cream Apply 30 -45 mins prior to port access. (Patient taking differently: Apply 1 application topically daily as needed (Apply 30 -45 mins prior to port access.).) 30 g 0  . lisinopril (ZESTRIL) 20 MG tablet Take 20 mg by mouth daily.    . nortriptyline (PAMELOR) 10 MG capsule Take 10 mg by mouth daily.    Marland Kitchen omeprazole (PRILOSEC) 20 MG capsule Take 20 mg by mouth daily.    . ondansetron (ZOFRAN) 8 MG tablet One pill every 8 hours as needed for nausea/vomitting. 40 tablet 1  . prochlorperazine  (COMPAZINE) 10 MG tablet Take 1 tablet (10 mg total) by mouth every 6 (six) hours as needed for nausea or vomiting. 40 tablet 1  . sertraline (ZOLOFT) 100 MG tablet Take 100 mg by mouth 2 (two) times daily.     Current Facility-Administered Medications  Medication Dose Route Frequency Provider Last Rate Last Admin  . heparin lock flush 100 unit/mL  500 Units Intravenous Once Charlaine Dalton R, MD      . sodium chloride flush (NS) 0.9 % injection 10 mL  10 mL Intravenous Once Cammie Sickle, MD       Facility-Administered Medications Ordered in Other Visits  Medication Dose Route Frequency Provider Last Rate Last Admin  . sodium chloride flush (NS) 0.9 % injection 10 mL  10 mL Intravenous PRN Cammie Sickle, MD   10 mL at 09/20/20 0829      .  PHYSICAL EXAMINATION: ECOG PERFORMANCE STATUS: 1 - Symptomatic but completely ambulatory  Vitals:   09/20/20 0913  BP: 136/74  Pulse: 68  Resp: 16  Temp: (!) 97.5 F (36.4 C)  SpO2: 100%   Filed Weights   09/20/20 0913  Weight: 152 lb (68.9 kg)    Physical Exam Constitutional:      Comments: Alone.  Ambulating.   HENT:     Head: Normocephalic and atraumatic.     Mouth/Throat:     Pharynx: No oropharyngeal exudate.  Eyes:     Pupils: Pupils are equal, round, and reactive to light.  Cardiovascular:     Rate and Rhythm: Normal rate and regular rhythm.  Pulmonary:     Effort: No respiratory distress.     Breath sounds: No wheezing.     Comments: Decreased breath sounds at bases.  Abdominal:     General: Bowel sounds are normal. There is no distension.     Palpations: Abdomen is soft. There is no mass.     Tenderness: There is no abdominal tenderness. There is no guarding or rebound.  Musculoskeletal:        General: No tenderness. Normal range of motion.     Cervical back: Normal range of motion and neck supple.  Skin:    General: Skin is warm.  Neurological:     Mental Status: She is alert and oriented to  person, place, and time.  Psychiatric:  Mood and Affect: Affect normal.      LABORATORY DATA:  I have reviewed the data as listed Lab Results  Component Value Date   WBC 9.9 09/20/2020   HGB 12.2 09/20/2020   HCT 37.6 09/20/2020   MCV 85.3 09/20/2020   PLT 247 09/20/2020   Recent Labs    08/08/20 0922 08/18/20 1442 09/20/20 0857  NA 137 136 137  K 4.6 3.9 4.7  CL 103 101 102  CO2 27 22 26   GLUCOSE 103* 127* 107*  BUN 19 13 22   CREATININE 0.99 1.12* 1.04*  CALCIUM 9.5 9.1 9.7  GFRNONAA >60 53* 58*  PROT  --  7.4 7.8  ALBUMIN  --  3.8 3.9  AST  --  21 25  ALT  --  17 21  ALKPHOS  --  77 73  BILITOT  --  0.4 0.4    RADIOGRAPHIC STUDIES: I have personally reviewed the radiological images as listed and agreed with the findings in the report. NM PET Image Initial (PI) Skull Base To Thigh  Result Date: 09/05/2020 CLINICAL DATA:  Initial treatment strategy for anal cancer. EXAM: NUCLEAR MEDICINE PET SKULL BASE TO THIGH TECHNIQUE: 7.8 mCi F-18 FDG was injected intravenously. Full-ring PET imaging was performed from the skull base to thigh after the radiotracer. CT data was obtained and used for attenuation correction and anatomic localization. Fasting blood glucose: 104 mg/dl COMPARISON:  None. FINDINGS: Mediastinal blood pool activity: SUV max 2.7 Liver activity: SUV max NA NECK: No hypermetabolic lymph nodes in the neck. Incidental CT findings: none CHEST: No hypermetabolic mediastinal or hilar nodes. No suspicious pulmonary nodules on the CT scan. Incidental CT findings: Coronary artery calcification is evident. Atherosclerotic calcification is noted in the wall of the thoracic aorta. Moderate hiatal hernia. Centrilobular emphsyema noted. 7 mm sub solid nodule in the right lower lobe appears similar to and was better characterized on lung cancer screening CT of 11/24/2019. ABDOMEN/PELVIS: No abnormal hypermetabolic activity within the liver, pancreas, adrenal glands, or  spleen. No hypermetabolic lymph nodes in the abdomen. Intense hypermetabolism is identified in the region of the anus with SUV max = 14.3. Small lymph nodes are identified in both inguinal regions, measuring 7 mm short axis on the right (224/3) and 7 mm short axis on the left (227/3). Both of these lymph nodes show low level FDG accumulation with SUV max = 1.6. Incidental CT findings: Low-attenuation thickening of the left adrenal gland without hypermetabolism suggest hyperplasia. Sequelae of aortobifemoral bypass grafting evident. SKELETON: No focal hypermetabolic activity to suggest skeletal metastasis. Incidental CT findings: none IMPRESSION: 1. Marked hypermetabolism in the anal region consistent with the reported history of anal cancer. 2. Tiny bilateral inguinal lymph nodes show discernible FDG accumulation despite their tiny size. As anal lesions distal to the dentate line can drain to the inguinal nodes, correlation with lesion location recommended and close attention on follow-up as metastatic disease not excluded. 3. 7 mm sub solid nodule right lower lobe without substantial change since lung cancer screening CT 11/24/2019. No hypermetabolism on PET imaging today. 4.  Aortic Atherosclerois (ICD10-170.0) 5.  Emphysema. ([id number]) Electronically Signed   By: Misty Stanley M.D.   On: 09/05/2020 10:51   DG CHEST PORT 1 VIEW  Result Date: 09/13/2020 CLINICAL DATA:  Port-A-Cath placement.  Anal carcinoma EXAM: PORTABLE CHEST 1 VIEW COMPARISON:  PET-CT September 04, 2020 FINDINGS: Port-A-Cath tip at cavoatrial junction. No pneumothorax. There is no edema or airspace opacity. Heart size and pulmonary  vascular normal. No adenopathy. There is a focal hiatal hernia. There is aortic atherosclerosis. No bone lesions. IMPRESSION: Port-A-Cath tip at cavoatrial junction. No pneumothorax. Lungs clear. Heart size within normal limits. No adenopathy. Hiatal hernia present. Aortic Atherosclerosis (ICD10-I70.0).  Electronically Signed   By: Lowella Grip III M.D.   On: 09/13/2020 16:15   DG C-Arm 1-60 Min-No Report  Result Date: 09/13/2020 Fluoroscopy was utilized by the requesting physician.  No radiographic interpretation.    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 PET scan-? Slight uptake in inguinal LN.   # PLAN  chemo-RT; however HOLD chemotherapy today sec to port issues- 5FU+Mitomycin.  Patient understands treatments are curative/definitive chemoradiation.   #Anal pain-recommend lidocaine gel for topical application prn stable.  # COPD-on inhalers not on oxygen; STABLE; Need to monitor closely on mitomycin.  # Port malfunction- discussed with Dr.Sakai.  Will need revision/readjustment.  # DISPOSITION:  # De-access # HOLD chemo; # follow up TBD-Dr.B  All questions were answered. The patient knows to call the clinic with any problems, questions or concerns.    Cammie Sickle, MD 09/20/2020 10:33 AM

## 2020-09-20 NOTE — Progress Notes (Signed)
Resistance met upon flushing port for deaccessing. Unable to instill any saline. Port was deaccessed. Pt will be following up with surgeon.

## 2020-09-20 NOTE — Assessment & Plan Note (Addendum)
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 PET scan-? Slight uptake in inguinal LN.   # PLAN  chemo-RT; however HOLD chemotherapy today sec to port issues- 5FU+Mitomycin.  Patient understands treatments are curative/definitive chemoradiation.   #Anal pain-recommend lidocaine gel for topical application prn stable.  # COPD-on inhalers not on oxygen; STABLE; Need to monitor closely on mitomycin.  # Port malfunction- discussed with Dr.Sakai.  Will need revision/readjustment.  # DISPOSITION:  # De-access # HOLD chemo; # follow up TBD-Dr.B

## 2020-09-20 NOTE — Telephone Encounter (Signed)
On 4/06-spoke to patient's son regarding the results of the PET scan stage II versus stage III-however this would not change management.  Await revision of the port; awaiting surgical evaluation.  Chemotherapy will be decided/after the port is fixed.

## 2020-09-21 ENCOUNTER — Ambulatory Visit
Admission: RE | Admit: 2020-09-21 | Discharge: 2020-09-21 | Disposition: A | Discharge status: Home / Self Care | Payer: Medicare Other | Source: Ambulatory Visit | Attending: Radiation Oncology | Admitting: Radiation Oncology

## 2020-09-21 DIAGNOSIS — C211 Malignant neoplasm of anal canal: Secondary | ICD-10-CM | POA: Diagnosis not present

## 2020-09-22 ENCOUNTER — Ambulatory Visit
Admission: RE | Admit: 2020-09-22 | Discharge: 2020-09-22 | Disposition: A | Discharge status: Home / Self Care | Payer: Medicare Other | Source: Ambulatory Visit | Attending: Radiation Oncology | Admitting: Radiation Oncology

## 2020-09-22 DIAGNOSIS — C211 Malignant neoplasm of anal canal: Secondary | ICD-10-CM | POA: Diagnosis not present

## 2020-09-25 ENCOUNTER — Ambulatory Visit: Payer: Self-pay | Admitting: Surgery

## 2020-09-25 ENCOUNTER — Ambulatory Visit
Admission: RE | Admit: 2020-09-25 | Discharge: 2020-09-25 | Disposition: A | Discharge status: Home / Self Care | Payer: Medicare Other | Source: Ambulatory Visit | Attending: Radiation Oncology | Admitting: Radiation Oncology

## 2020-09-25 ENCOUNTER — Other Ambulatory Visit: Payer: Self-pay

## 2020-09-25 ENCOUNTER — Other Ambulatory Visit
Admission: RE | Admit: 2020-09-25 | Discharge: 2020-09-25 | Disposition: A | Discharge status: Home / Self Care | Payer: Medicare Other | Source: Ambulatory Visit | Attending: Surgery | Admitting: Surgery

## 2020-09-25 DIAGNOSIS — Z01818 Encounter for other preprocedural examination: Secondary | ICD-10-CM | POA: Insufficient documentation

## 2020-09-25 DIAGNOSIS — C211 Malignant neoplasm of anal canal: Secondary | ICD-10-CM | POA: Diagnosis not present

## 2020-09-25 NOTE — H&P (View-Only) (Signed)
  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 female who returns for above. Notified by cancer center unable to draw blood from port.    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  Past Surgical History:  has a past surgical history that includes Cesarean section; Hysterectomy; Aorta - femoral artery bypass graft; and Endovascular Transluminal Balloon Angioplasty Femoral/Popliteal Artery (Left).  Family History: family history includes Diabetes type II in her son; Osteoarthritis in her mother.  Social History:  reports that she has been smoking. She has never used smokeless tobacco. She reports that she does not drink alcohol. No history on file for drug use.  Current Medications: has a current medication list which includes the following prescription(s): apixaban, aspirin, cholecalciferol, docusate, ezetimibe-simvastatin, fluticasone propion-salmeterol, ibuprofen, lidocaine, lidocaine-prilocaine, lisinopril, nortriptyline, omeprazole, proair hfa, sertraline, spiriva with handihaler, hydrocortisone, and metformin.  Allergies:  Allergies Allergen           Reactions .           Codeine           Nausea And Vomiting   ROS:  A 15 point review of systems was performed and pertinent positives and negatives noted in HPI   Objective:  BP 124/69   Pulse 79   Ht 154.9 cm (5\' 1" )   Wt 65.8 kg (145 lb)   BMI 27.40 kg/m   Constitutional :            alert, appears stated age, cooperative and no distress Lymphatics/Throat:      no asymmetry, masses, or scars Respiratory:     clear to auscultation bilaterally Cardiovascular:           regular rate and rhythm Gastrointestinal:          soft, non-tender; bowel sounds normal; no masses,  no organomegaly.   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  1. Risk alternative benefits discussed.  Risks include bleeding, infection, dislocation, migration, malfunction, pneumothorax, and additional procedures to address that risks.  Benefits include initiation of chemotherapy.  Alternative includes non-IV infusion therapy.  Patient verbalized understanding and all questions and concerns addressed.  HOLD ELIQUIS PRIOR TO SURGERY PENDING PRIMARY APPROVAL

## 2020-09-25 NOTE — Patient Instructions (Addendum)
Your procedure is scheduled on: 09/28/20 - Thursday Report to the Registration Desk on the 1st floor of the [facility]. To find out your arrival time, please call [phone] between 1PM - 3PM on: 09/27/20 - Wednesday Report to [facility] for Covid test and bag.  REMEMBER: Instructions that are not followed completely may result in serious medical risk, up to and including death; or upon the discretion of your surgeon and anesthesiologist your surgery may need to be rescheduled.  Do not eat food after midnight the night before surgery.  No gum chewing, lozengers or hard candies.  You may however, drink CLEAR liquids up to 2 hours before you are scheduled to arrive for your surgery. Do not drink anything within 2 hours of your scheduled arrival time.  Clear liquids include: - water  - apple juice without pulp - gatorade (not RED, PURPLE, OR BLUE) - black coffee or tea (Do NOT add milk or creamers to the coffee or tea) Do NOT drink anything that is not on this list.  Type 1 and Type 2 diabetics should only drink water.  TAKE THESE MEDICATIONS THE MORNING OF SURGERY WITH A SIP OF WATER:  - Fluticasone-Salmeterol (ADVAIR) 250-50 MCG/DOSE AEPB - nortriptyline (PAMELOR) 10 MG capsule - omeprazole (PRILOSEC) 20 MG capsule, take one the night before and one on the morning of surgery - helps to prevent nausea after surgery. - sertraline (ZOLOFT) 100 MG tablet  Use inhaler albuterol (VENTOLIN HFA) 108 (90 Base) MCG/ACT inhaler on the day of surgery and bring to the hospital.   Follow recommendations from Cardiologist, Pulmonologist or PCP regarding stopping Aspirin, Coumadin, Plavix, Eliquis, Pradaxa, or Pletal. Stop apixaban (ELIQUIS) 5 MG TABS tablet beginning 09/25/20 until after procedure.  One week prior to surgery: Stop Anti-inflammatories (NSAIDS) such as Advil, Aleve, Ibuprofen, Motrin, Naproxen, Naprosyn and Aspirin based products such as Excedrin, Goodys Powder, BC  Powder.  Stop ANY OVER THE COUNTER supplements until after surgery.  No Alcohol for 24 hours before or after surgery.  No Smoking including e-cigarettes for 24 hours prior to surgery.  No chewable tobacco products for at least 6 hours prior to surgery.  No nicotine patches on the day of surgery.  Do not use any "recreational" drugs for at least a week prior to your surgery.  Please be advised that the combination of cocaine and anesthesia may have negative outcomes, up to and including death. If you test positive for cocaine, your surgery will be cancelled.  On the morning of surgery brush your teeth with toothpaste and water, you may rinse your mouth with mouthwash if you wish. Do not swallow any toothpaste or mouthwash.  Do not wear jewelry, make-up, hairpins, clips or nail polish.  Do not wear lotions, powders, or perfumes.   Do not shave body from the neck down 48 hours prior to surgery just in case you cut yourself which could leave a site for infection.  Also, freshly shaved skin may become irritated if using the CHG soap.  Contact lenses, hearing aids and dentures may not be worn into surgery.  Do not bring valuables to the hospital. [facility] is not responsible for any missing/lost belongings or valuables.   Use CHG Soap or wipes as directed on instruction sheet.  Notify your doctor if there is any change in your medical condition (cold, fever, infection).  Wear comfortable clothing (specific to your surgery type) to the hospital.  Plan for stool softeners for home use; pain medications have  a tendency to cause constipation. You can also help prevent constipation by eating foods high in fiber such as fruits and vegetables and drinking plenty of fluids as your diet allows.  After surgery, you can help prevent lung complications by doing breathing exercises.  Take deep breaths and cough every 1-2 hours. Your doctor may order a device called an Incentive Spirometer to  help you take deep breaths. When coughing or sneezing, hold a pillow firmly against your incision with both hands. This is called "splinting." Doing this helps protect your incision. It also decreases belly discomfort.  If you are being admitted to the hospital overnight, leave your suitcase in the car. After surgery it may be brought to your room.  If you are being discharged the day of surgery, you will not be allowed to drive home. You will need a responsible adult (18 years or older) to drive you home and stay with you that night.   If you are taking public transportation, you will need to have a responsible adult (18 years or older) with you. Please confirm with your physician that it is acceptable to use public transportation.   Please call the [place] [facility] at [phone] if you have any questions about these instructions.  Surgery Visitation Policy:  Patients undergoing a surgery or procedure may have one family member or support person with them as long as that person is not COVID-19 positive or experiencing its symptoms.  That person may remain in the waiting area during the procedure.  Inpatient Visitation:    Visiting hours are 7 a.m. to 8 p.m. Inpatients will be allowed two visitors daily. The visitors may change each day during the patient's stay. No visitors under the age of 28. Any visitor under the age of 51 must be accompanied by an adult. The visitor must pass COVID-19 screenings, use hand sanitizer when entering and exiting the patient's room and wear a mask at all times, including in the patient's room. Patients must also wear a mask when staff or their visitor are in the room. Masking is required regardless of vaccination status.

## 2020-09-25 NOTE — Telephone Encounter (Signed)
Note: Pt to see surgeon today to discuss port a cath revision

## 2020-09-25 NOTE — H&P (Signed)
  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 female who returns for above. Notified by cancer center unable to draw blood from port.    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  Past Surgical History:  has a past surgical history that includes Cesarean section; Hysterectomy; Aorta - femoral artery bypass graft; and Endovascular Transluminal Balloon Angioplasty Femoral/Popliteal Artery (Left).  Family History: family history includes Diabetes type II in her son; Osteoarthritis in her mother.  Social History:  reports that she has been smoking. She has never used smokeless tobacco. She reports that she does not drink alcohol. No history on file for drug use.  Current Medications: has a current medication list which includes the following prescription(s): apixaban, aspirin, cholecalciferol, docusate, ezetimibe-simvastatin, fluticasone propion-salmeterol, ibuprofen, lidocaine, lidocaine-prilocaine, lisinopril, nortriptyline, omeprazole, proair hfa, sertraline, spiriva with handihaler, hydrocortisone, and metformin.  Allergies:  Allergies Allergen           Reactions .           Codeine           Nausea And Vomiting   ROS:  A 15 point review of systems was performed and pertinent positives and negatives noted in HPI   Objective:  BP 124/69   Pulse 79   Ht 154.9 cm (5\' 1" )   Wt 65.8 kg (145 lb)   BMI 27.40 kg/m   Constitutional :            alert, appears stated age, cooperative and no distress Lymphatics/Throat:      no asymmetry, masses, or scars Respiratory:     clear to auscultation bilaterally Cardiovascular:           regular rate and rhythm Gastrointestinal:          soft, non-tender; bowel sounds normal; no masses,  no organomegaly.   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  1. Risk alternative benefits discussed.  Risks include bleeding, infection, dislocation, migration, malfunction, pneumothorax, and additional procedures to address that risks.  Benefits include initiation of chemotherapy.  Alternative includes non-IV infusion therapy.  Patient verbalized understanding and all questions and concerns addressed.  HOLD ELIQUIS PRIOR TO SURGERY PENDING PRIMARY APPROVAL

## 2020-09-26 ENCOUNTER — Ambulatory Visit
Admission: RE | Admit: 2020-09-26 | Discharge: 2020-09-26 | Disposition: A | Discharge status: Home / Self Care | Payer: Medicare Other | Source: Ambulatory Visit | Attending: Radiation Oncology | Admitting: Radiation Oncology

## 2020-09-26 ENCOUNTER — Other Ambulatory Visit
Admission: RE | Admit: 2020-09-26 | Discharge: 2020-09-26 | Disposition: A | Discharge status: Home / Self Care | Payer: Medicare Other | Source: Ambulatory Visit | Attending: Surgery | Admitting: Surgery

## 2020-09-26 ENCOUNTER — Other Ambulatory Visit: Payer: Self-pay | Admitting: *Deleted

## 2020-09-26 DIAGNOSIS — C211 Malignant neoplasm of anal canal: Secondary | ICD-10-CM

## 2020-09-26 DIAGNOSIS — Z20822 Contact with and (suspected) exposure to covid-19: Secondary | ICD-10-CM | POA: Insufficient documentation

## 2020-09-26 DIAGNOSIS — Z01812 Encounter for preprocedural laboratory examination: Secondary | ICD-10-CM | POA: Diagnosis present

## 2020-09-26 LAB — SARS CORONAVIRUS 2 (TAT 6-24 HRS): SARS Coronavirus 2: NEGATIVE

## 2020-09-26 NOTE — Telephone Encounter (Addendum)
Per Dr. Jacinto Reap - since port will be placed on Thursday this week. Md will plan for port lab/ mitomycin and 78fu treatment (5 day pump) on Friday 4/15. - [place], please arrange.  Dr. Jacinto Reap - please advise on future follow-up apts.

## 2020-09-27 ENCOUNTER — Other Ambulatory Visit: Payer: Self-pay | Admitting: Internal Medicine

## 2020-09-27 ENCOUNTER — Ambulatory Visit
Admission: RE | Admit: 2020-09-27 | Discharge: 2020-09-27 | Disposition: A | Discharge status: Home / Self Care | Payer: Medicare Other | Source: Ambulatory Visit | Attending: Radiation Oncology | Admitting: Radiation Oncology

## 2020-09-27 ENCOUNTER — Inpatient Hospital Stay: Payer: Medicare Other

## 2020-09-27 ENCOUNTER — Telehealth: Payer: Self-pay | Admitting: Internal Medicine

## 2020-09-27 DIAGNOSIS — C211 Malignant neoplasm of anal canal: Secondary | ICD-10-CM | POA: Diagnosis not present

## 2020-09-27 NOTE — Telephone Encounter (Signed)
C-please schedule appt on 4/19- MD; labs- cbc/cmp; pump OFF;   Cancel 4/20 appt.   Thanks GB

## 2020-09-28 ENCOUNTER — Ambulatory Visit
Admission: RE | Admit: 2020-09-28 | Discharge: 2020-09-28 | Disposition: A | Discharge status: Home / Self Care | Payer: Medicare Other | Attending: Surgery | Admitting: Surgery

## 2020-09-28 ENCOUNTER — Encounter: Payer: Self-pay | Admitting: Surgery

## 2020-09-28 ENCOUNTER — Other Ambulatory Visit: Payer: Self-pay

## 2020-09-28 ENCOUNTER — Ambulatory Visit: Payer: Medicare Other

## 2020-09-28 ENCOUNTER — Ambulatory Visit: Payer: Medicare Other | Admitting: Urgent Care

## 2020-09-28 ENCOUNTER — Encounter
Admission: RE | Disposition: A | Discharge status: Home / Self Care | Payer: Self-pay | Source: Home / Self Care | Attending: Surgery

## 2020-09-28 DIAGNOSIS — Z79899 Other long term (current) drug therapy: Secondary | ICD-10-CM | POA: Diagnosis not present

## 2020-09-28 DIAGNOSIS — Y831 Surgical operation with implant of artificial internal device as the cause of abnormal reaction of the patient, or of later complication, without mention of misadventure at the time of the procedure: Secondary | ICD-10-CM | POA: Insufficient documentation

## 2020-09-28 DIAGNOSIS — F17200 Nicotine dependence, unspecified, uncomplicated: Secondary | ICD-10-CM | POA: Insufficient documentation

## 2020-09-28 DIAGNOSIS — T82594A Other mechanical complication of infusion catheter, initial encounter: Secondary | ICD-10-CM | POA: Insufficient documentation

## 2020-09-28 DIAGNOSIS — Z7901 Long term (current) use of anticoagulants: Secondary | ICD-10-CM | POA: Diagnosis not present

## 2020-09-28 DIAGNOSIS — Z7984 Long term (current) use of oral hypoglycemic drugs: Secondary | ICD-10-CM | POA: Insufficient documentation

## 2020-09-28 DIAGNOSIS — C210 Malignant neoplasm of anus, unspecified: Secondary | ICD-10-CM | POA: Diagnosis not present

## 2020-09-28 HISTORY — PX: PORTACATH PLACEMENT: SHX2246

## 2020-09-28 SURGERY — INSERTION, TUNNELED CENTRAL VENOUS DEVICE, WITH PORT
Anesthesia: General | Site: Chest | Laterality: Right

## 2020-09-28 MED ORDER — PHENYLEPHRINE HCL (PRESSORS) 10 MG/ML IV SOLN
INTRAVENOUS | Status: DC | PRN
  Administered 2020-09-28: 100 ug via INTRAVENOUS
  Administered 2020-09-28 (×2): 200 ug via INTRAVENOUS
  Administered 2020-09-28 (×2): 100 ug via INTRAVENOUS
  Administered 2020-09-28 (×2): 200 ug via INTRAVENOUS

## 2020-09-28 MED ORDER — HEPARIN SOD (PORK) LOCK FLUSH 100 UNIT/ML IV SOLN
INTRAVENOUS | Status: DC | PRN
  Administered 2020-09-28: 500 [IU]

## 2020-09-28 MED ORDER — CEFAZOLIN SODIUM-DEXTROSE 2-4 GM/100ML-% IV SOLN
INTRAVENOUS | Status: AC
  Filled 2020-09-28: qty 100

## 2020-09-28 MED ORDER — EPHEDRINE 5 MG/ML INJ
INTRAVENOUS | Status: AC
  Filled 2020-09-28: qty 10

## 2020-09-28 MED ORDER — CHLORHEXIDINE GLUCONATE 0.12 % MT SOLN
15.0000 mL | Freq: Once | OROMUCOSAL | Status: AC
  Administered 2020-09-28: 15 mL via OROMUCOSAL

## 2020-09-28 MED ORDER — LIDOCAINE HCL (CARDIAC) PF 100 MG/5ML IV SOSY
PREFILLED_SYRINGE | INTRAVENOUS | Status: DC | PRN
  Administered 2020-09-28: 100 mg via INTRAVENOUS

## 2020-09-28 MED ORDER — PHENYLEPHRINE HCL (PRESSORS) 10 MG/ML IV SOLN
INTRAVENOUS | Status: AC
  Filled 2020-09-28: qty 1

## 2020-09-28 MED ORDER — FENTANYL CITRATE (PF) 100 MCG/2ML IJ SOLN
INTRAMUSCULAR | Status: DC | PRN
  Administered 2020-09-28 (×2): 25 ug via INTRAVENOUS
  Administered 2020-09-28 (×2): 12.5 ug via INTRAVENOUS

## 2020-09-28 MED ORDER — EPHEDRINE SULFATE 50 MG/ML IJ SOLN
INTRAMUSCULAR | Status: DC | PRN
  Administered 2020-09-28 (×2): 10 mg via INTRAVENOUS

## 2020-09-28 MED ORDER — CHLORHEXIDINE GLUCONATE 0.12 % MT SOLN
OROMUCOSAL | Status: AC
  Filled 2020-09-28: qty 15

## 2020-09-28 MED ORDER — ONDANSETRON HCL 4 MG/2ML IJ SOLN: 4.0000 mg | Freq: Once | INTRAMUSCULAR | Status: DC | PRN

## 2020-09-28 MED ORDER — ONDANSETRON HCL 4 MG/2ML IJ SOLN
INTRAMUSCULAR | Status: AC
  Filled 2020-09-28: qty 2

## 2020-09-28 MED ORDER — DEXAMETHASONE SODIUM PHOSPHATE 10 MG/ML IJ SOLN
INTRAMUSCULAR | Status: DC | PRN
  Administered 2020-09-28: 5 mg via INTRAVENOUS

## 2020-09-28 MED ORDER — DEXAMETHASONE SODIUM PHOSPHATE 10 MG/ML IJ SOLN
INTRAMUSCULAR | Status: AC
  Filled 2020-09-28: qty 1

## 2020-09-28 MED ORDER — LIDOCAINE-EPINEPHRINE (PF) 1 %-1:200000 IJ SOLN
INTRAMUSCULAR | Status: DC | PRN
  Administered 2020-09-28: 3 mL via INTRAMUSCULAR

## 2020-09-28 MED ORDER — PROPOFOL 10 MG/ML IV BOLUS
INTRAVENOUS | Status: DC | PRN
  Administered 2020-09-28: 120 mg via INTRAVENOUS

## 2020-09-28 MED ORDER — BUPIVACAINE-EPINEPHRINE (PF) 0.5% -1:200000 IJ SOLN
INTRAMUSCULAR | Status: AC
  Filled 2020-09-28: qty 30

## 2020-09-28 MED ORDER — ACETAMINOPHEN 325 MG PO TABS: 325.0000 mg | ORAL_TABLET | ORAL | Status: DC | PRN

## 2020-09-28 MED ORDER — LACTATED RINGERS IV SOLN: INTRAVENOUS | Status: DC

## 2020-09-28 MED ORDER — HEPARIN SOD (PORK) LOCK FLUSH 100 UNIT/ML IV SOLN
INTRAVENOUS | Status: AC
  Filled 2020-09-28: qty 5

## 2020-09-28 MED ORDER — ORAL CARE MOUTH RINSE: 15.0000 mL | Freq: Once | OROMUCOSAL | Status: AC

## 2020-09-28 MED ORDER — FENTANYL CITRATE (PF) 100 MCG/2ML IJ SOLN
INTRAMUSCULAR | Status: AC
  Filled 2020-09-28: qty 2

## 2020-09-28 MED ORDER — CEFAZOLIN SODIUM-DEXTROSE 2-4 GM/100ML-% IV SOLN
2.0000 g | INTRAVENOUS | Status: AC
  Administered 2020-09-28: 2 g via INTRAVENOUS

## 2020-09-28 MED ORDER — ONDANSETRON HCL 4 MG/2ML IJ SOLN
INTRAMUSCULAR | Status: DC | PRN
  Administered 2020-09-28: 4 mg via INTRAVENOUS

## 2020-09-28 MED ORDER — FENTANYL CITRATE (PF) 100 MCG/2ML IJ SOLN: 25.0000 ug | INTRAMUSCULAR | Status: DC | PRN

## 2020-09-28 MED ORDER — LIDOCAINE HCL (PF) 2 % IJ SOLN
INTRAMUSCULAR | Status: AC
  Filled 2020-09-28: qty 5

## 2020-09-28 MED ORDER — LIDOCAINE-EPINEPHRINE 1 %-1:100000 IJ SOLN
INTRAMUSCULAR | Status: AC
  Filled 2020-09-28: qty 1

## 2020-09-28 MED ORDER — CHLORHEXIDINE GLUCONATE CLOTH 2 % EX PADS
6.0000 | MEDICATED_PAD | Freq: Once | CUTANEOUS | Status: AC
  Administered 2020-09-28: 6 via TOPICAL

## 2020-09-28 MED ORDER — DROPERIDOL 2.5 MG/ML IJ SOLN
0.6250 mg | Freq: Once | INTRAMUSCULAR | Status: DC | PRN
  Filled 2020-09-28: qty 2

## 2020-09-28 MED ORDER — ACETAMINOPHEN 160 MG/5ML PO SOLN
325.0000 mg | ORAL | Status: DC | PRN
  Filled 2020-09-28: qty 20.3

## 2020-09-28 MED ORDER — PROPOFOL 10 MG/ML IV BOLUS
INTRAVENOUS | Status: AC
  Filled 2020-09-28: qty 20

## 2020-09-28 SURGICAL SUPPLY — 35 items
APL PRP STRL LF DISP 70% ISPRP (MISCELLANEOUS) ×1
APL SKNCLS STERI-STRIP NONHPOA (GAUZE/BANDAGES/DRESSINGS) ×1
BAG DECANTER FOR FLEXI CONT (MISCELLANEOUS) ×2 IMPLANT
BENZOIN TINCTURE PRP APPL 2/3 (GAUZE/BANDAGES/DRESSINGS) ×2 IMPLANT
BLADE SURG SZ11 CARB STEEL (BLADE) ×2 IMPLANT
BOOT SUTURE AID YELLOW STND (SUTURE) ×2 IMPLANT
CANISTER SUCT 1200ML W/VALVE (MISCELLANEOUS) ×2 IMPLANT
CHLORAPREP W/TINT 26 (MISCELLANEOUS) ×2 IMPLANT
COVER LIGHT HANDLE STERIS (MISCELLANEOUS) ×4 IMPLANT
COVER WAND RF STERILE (DRAPES) ×2 IMPLANT
DECANTER SPIKE VIAL GLASS SM (MISCELLANEOUS) ×2 IMPLANT
DRAPE C-ARM XRAY 36X54 (DRAPES) ×2 IMPLANT
DRSG TEGADERM 4X4.75 (GAUZE/BANDAGES/DRESSINGS) ×2 IMPLANT
ELECT CAUTERY BLADE 6.4 (BLADE) ×2 IMPLANT
ELECT REM PT RETURN 9FT ADLT (ELECTROSURGICAL) ×2
ELECTRODE REM PT RTRN 9FT ADLT (ELECTROSURGICAL) ×1 IMPLANT
GLOVE SURG SYN 6.5 ES PF (GLOVE) ×2 IMPLANT
GLOVE SURG UNDER POLY LF SZ7 (GLOVE) ×2 IMPLANT
GOWN STRL REUS W/ TWL LRG LVL3 (GOWN DISPOSABLE) ×2 IMPLANT
GOWN STRL REUS W/TWL LRG LVL3 (GOWN DISPOSABLE) ×4
IV NS 500ML (IV SOLUTION) ×2
IV NS 500ML BAXH (IV SOLUTION) ×1 IMPLANT
KIT TURNOVER KIT A (KITS) ×2 IMPLANT
LABEL OR SOLS (LABEL) ×2 IMPLANT
MANIFOLD NEPTUNE II (INSTRUMENTS) ×2 IMPLANT
NDL SAFETY ECLIPSE 18X1.5 (NEEDLE) ×1 IMPLANT
NEEDLE HYPO 18GX1.5 SHARP (NEEDLE) ×2
PACK PORT-A-CATH (MISCELLANEOUS) ×2 IMPLANT
SPONGE VERSALON 4X4 4PLY (MISCELLANEOUS) ×2 IMPLANT
STRIP CLOSURE SKIN 1/2X4 (GAUZE/BANDAGES/DRESSINGS) ×2 IMPLANT
SUT MNCRL AB 4-0 PS2 18 (SUTURE) ×2 IMPLANT
SUT PROLENE 2 0 SH DA (SUTURE) ×2 IMPLANT
SUT VIC AB 3-0 SH 27 (SUTURE) ×2
SUT VIC AB 3-0 SH 27X BRD (SUTURE) ×1 IMPLANT
SYR 10ML LL (SYRINGE) ×2 IMPLANT

## 2020-09-28 NOTE — Anesthesia Preprocedure Evaluation (Addendum)
Anesthesia Evaluation  Patient identified by MRN, date of birth, ID band Patient awake    Reviewed: Allergy & Precautions, H&P , NPO status , reviewed documented beta blocker date and time   Airway Mallampati: II  TM Distance: >3 FB Neck ROM: full    Dental  (+) Edentulous Upper, Edentulous Lower   Pulmonary COPD, Current Smoker and Patient abstained from smoking.,    Pulmonary exam normal        Cardiovascular hypertension, + CAD and + Peripheral Vascular Disease  Normal cardiovascular exam     Neuro/Psych PSYCHIATRIC DISORDERS Depression    GI/Hepatic hiatal hernia, GERD  Medicated and Controlled,  Endo/Other    Renal/GU      Musculoskeletal  (+) Arthritis ,   Abdominal   Peds  Hematology   Anesthesia Other Findings Past Medical History: No date: Arthritis     Comment:  LEFT KNEE  No date: CAD (coronary artery disease) No date: COPD (chronic obstructive pulmonary disease) (HCC) No date: Depression No date: Emphysema lung (HCC) No date: GERD (gastroesophageal reflux disease) No date: Hernia, hiatal No date: Hyperlipidemia No date: Hypertension No date: Peripheral vascular disease (HCC) No date: Pre-diabetes No date: Thrombosis     Comment:  in Lt leg Past Surgical History: No date: ABDOMINAL AORTA STENT No date: ABDOMINAL HYSTERECTOMY 2005: aortobifemoral bypass No date: CESAREAN SECTION     Comment:  x 3 03/10/2019: COLONOSCOPY WITH PROPOFOL; N/A     Comment:  Procedure: COLONOSCOPY WITH PROPOFOL;  Surgeon:               Virgel Manifold, MD;  Location: [facility] END[facility];                Service: Endoscopy;  Laterality: N/A; 03/10/2019: ESOPHAGOGASTRODUODENOSCOPY (EGD) WITH PROPOFOL; N/A     Comment:  Procedure: ESOPHAGOGASTRODUODENOSCOPY (EGD) WITH               PROPOFOL;  Surgeon: Virgel Manifold, MD;  Location:               [facility] ENDOSCOPY;  Service: Endoscopy;  Laterality: N/A; 08/10/2020:  EVALUATION UNDER ANESTHESIA WITH HEMORRHOIDECTOMY; N/A     Comment:  Procedure: EXAM UNDER ANESTHESIA WITH HEMORRHOIDECTOMY;               Surgeon: Benjamine Sprague, DO;  Location: [facility] ORS;  Service:              General;  Laterality: N/A; No date: femoral stents 10/02/2016: LOWER EXTREMITY ANGIOGRAPHY; Left     Comment:  Procedure: Lower Extremity Angiography;  Surgeon: Algernon Huxley, MD;  Location: [facility];  Service:               Cardiovascular;  Laterality: Left; 10/03/2016: LOWER EXTREMITY ANGIOGRAPHY; N/A     Comment:  Procedure: Lower Extremity Angiography with possible               revascularization;  Surgeon: Algernon Huxley, MD;  Location:               [facility];  Service: Cardiovascular;                Laterality: N/A; 09/13/2020: PORTACATH PLACEMENT; N/A     Comment:  Procedure: INSERTION PORT-A-CATH;  Surgeon: Benjamine Sprague, DO;  Location:  [facility] ORS;  Service: General;                Laterality: N/A; No date: VASCULAR SURGERY BMI    Body Mass Index: 27.78 kg/m     Reproductive/Obstetrics                            Anesthesia Physical Anesthesia Plan  ASA: III  Anesthesia Plan: General LMA   Post-op Pain Management:    Induction: Intravenous  PONV Risk Score and Plan: Ondansetron, Dexamethasone and Treatment may vary due to age or medical condition  Airway Management Planned: LMA  Additional Equipment:   Intra-op Plan:   Post-operative Plan: Extubation in OR  Informed Consent: I have reviewed the patients History and Physical, chart, labs and discussed the procedure including the risks, benefits and alternatives for the proposed anesthesia with the patient or authorized representative who has indicated his/her understanding and acceptance.     Dental Advisory Given  Plan Discussed with: CRNA  Anesthesia Plan Comments:        Anesthesia Quick Evaluation

## 2020-09-28 NOTE — Discharge Instructions (Signed)
AMBULATORY SURGERY  DISCHARGE INSTRUCTIONS   1) The drugs that you were given will stay in your system until tomorrow so for the next 24 hours you should not:  A) Drive an automobile B) Make any legal decisions C) Drink any alcoholic beverage   2) You may resume regular meals tomorrow.  Today it is better to start with liquids and gradually work up to solid foods.  You may eat anything you prefer, but it is better to start with liquids, then soup and crackers, and gradually work up to solid foods.   3) Please notify your doctor immediately if you have any unusual bleeding, trouble breathing, redness and pain at the surgery site, drainage, fever, or pain not relieved by medication.    4) Additional Instructions:        Please contact your physician with any problems or Same Day Surgery at [phone], Monday through Friday 6 am to 4 pm, or [place] at [facility] number at [phone].Port adjustment, Care After This sheet gives you information about how to care for yourself after your procedure. Your health care provider may also give you more specific instructions. If you have problems or questions, contact your health care provider. What can I expect after the procedure? After the procedure, it is common to have:  Soreness.  Bruising.  Itching. Follow these instructions at home: site care Follow instructions from your health care provider about how to take care of your site. Make sure you:  Wash your hands with soap and water before and after you change your bandage (dressing). If soap and water are not available, use hand sanitizer.  Leave stitches (sutures), skin glue, or adhesive strips in place. These skin closures may need to stay in place for 2 weeks or longer. If adhesive strip edges start to loosen and curl up, you may trim the loose edges. Do not remove adhesive strips completely unless your health care provider tells you to do that.  If the area bleeds  or bruises, apply gentle pressure for 10 minutes.  REMOVE TEGADERM IN 24HRS, AND OK TO SHOWER IN 24HRS.  Check your site every day for signs of infection. Check for:  Redness, swelling, or pain.  Fluid or blood.  Warmth.  Pus or a bad smell.  General instructions  RESUME ASPIRIN AND ELIQUIS IN 48HRS  Rest and then return to your normal activities as told by your health care provider. .  tylenol and advil as needed for discomfort.  Please alternate between the two every four hours as needed for pain.   .  Use narcotics, if prescribed, only when tylenol and motrin is not enough to control pain. .  325-650mg  every 8hrs to max of 3000mg /24hrs (including the 325mg  in every norco dose) for the tylenol.   .  Advil up to 800mg  per dose every 8hrs as needed for pain.    Keep all follow-up visits as told by your health care provider. This is important. Contact a health care provider if:  You have redness, swelling, or pain around your site.  You have fluid or blood coming from your site.  Your site feels warm to the touch.  You have pus or a bad smell coming from your site.  You have a fever.  Your sutures, skin glue, or adhesive strips loosen or come off sooner than expected. Get help right away if:  You have bleeding that does not stop with pressure or a dressing. Summary  After the procedure,  it is common to have some soreness, bruising, and itching at the site.  Follow instructions from your health care provider about how to take care of your site.  Check your site every day for signs of infection.  Contact a health care provider if you have redness, swelling, or pain around your site, or your site feels warm to the touch.  Keep all follow-up visits as told by your health care provider. This is important. This information is not intended to replace advice given to you by your health care provider. Make sure you discuss any questions you have with your health care  provider. Document Released: 06/30/2015 Document Revised: 12/01/2017 Document Reviewed: 12/01/2017 Elsevier Interactive Patient Education  [employer].

## 2020-09-28 NOTE — Interval H&P Note (Signed)
History and Physical Interval Note:  09/28/2020 0715  Mariah Rogers  has presented today for surgery, with the diagnosis of Anal cancer C21.0 Encounter for care related to Port-a-Cath Z45.2.  The various methods of treatment have been discussed with the patient and family. After consideration of risks, benefits and other options for treatment, the patient has consented to  Procedure(s): REVISION/REPOSITIONING OF PORT-A-CATH (Right) as a surgical intervention.  The patient's history has been reviewed, patient examined, no change in status, stable for surgery.  I have reviewed the patient's chart and labs.  Questions were answered to the patient's satisfaction.     Teasha Murrillo Lysle Pearl

## 2020-09-28 NOTE — Anesthesia Postprocedure Evaluation (Signed)
Anesthesia Post Note  Patient: Mariah Rogers  Procedure(s) Performed: REVISION/REPOSITIONING OF PORT-A-CATH (Right Chest)  Patient location during evaluation: PACU Anesthesia Type: General Level of consciousness: awake and alert Pain management: pain level controlled Vital Signs Assessment: post-procedure vital signs reviewed and stable Respiratory status: spontaneous breathing, nonlabored ventilation and respiratory function stable Cardiovascular status: blood pressure returned to baseline and stable Postop Assessment: no apparent nausea or vomiting Anesthetic complications: no   No complications documented.   Last Vitals:  Vitals:   09/28/20 0915 09/28/20 0953  BP: (!) 103/58 121/60  Pulse: 73 75  Resp: 18 16  Temp: 36.8 C   SpO2: 96% 98%    Last Pain:  Vitals:   09/28/20 0953  TempSrc:   PainSc: 0-No pain                 Alphonsus Sias

## 2020-09-28 NOTE — Transfer of Care (Signed)
Immediate Anesthesia Transfer of Care Note  Patient: Mariah Rogers  Procedure(s) Performed: REVISION/REPOSITIONING OF PORT-A-CATH (Right Chest)  Patient Location: PACU  Anesthesia Type:General  Level of Consciousness: awake, alert  and oriented  Airway & Oxygen Therapy: Patient Spontanous Breathing and Patient connected to face mask oxygen  Post-op Assessment: Report given to RN and Post -op Vital signs reviewed and stable  Post vital signs: Reviewed and stable  Last Vitals:  Vitals Value Taken Time  BP 125/47 09/28/20 0836  Temp    Pulse 82 09/28/20 0839  Resp 20 09/28/20 0839  SpO2 100 % 09/28/20 0839  Vitals shown include unvalidated device data.  Last Pain:  Vitals:   09/28/20 0617  TempSrc: Temporal  PainSc: 0-No pain      Patients Stated Pain Goal: 0 (68/34/19 6222)  Complications: No complications documented.

## 2020-09-28 NOTE — Anesthesia Procedure Notes (Signed)
Procedure Name: LMA Insertion Date/Time: 09/28/2020 7:37 AM Performed by: Allean Found, CRNA Pre-anesthesia Checklist: Patient identified, Patient being monitored, Timeout performed, Emergency Drugs available and Suction available Patient Re-evaluated:Patient Re-evaluated prior to induction Oxygen Delivery Method: Circle system utilized Preoxygenation: Pre-oxygenation with 100% oxygen Induction Type: IV induction Ventilation: Mask ventilation without difficulty LMA: LMA inserted LMA Size: 4.0 Tube type: Oral Number of attempts: 1 Placement Confirmation: positive ETCO2 and breath sounds checked- equal and bilateral Tube secured with: Tape Dental Injury: Teeth and Oropharynx as per pre-operative assessment

## 2020-09-28 NOTE — Op Note (Signed)
--  OP NOTE  DATE OF PROCEDURE: 09/28/2020   SURGEON: Lysle Pearl  ANESTHESIA: LMA  PRE-OPERATIVE DIAGNOSIS: Anal cancer requring port for chemotherapy, malfunctioning port  POST-OPERATIVE DIAGNOSIS: same  PROCEDURE(S):  Port-A-Cath adjustment  INTRAOPERATIVE FINDINGS:  Kink noted in catheter at attachment to port reservoir.  Straightened and confirmed of no acute angulation on fluoroscopy imaging and confirmation of easy blood draw and flush   ESTIMATED BLOOD LOSS: Minimal (<20 mL)   SPECIMENS: None   IMPLANTS: None  DRAINS: None   COMPLICATIONS: None apparent   CONDITION AT COMPLETION: Hemodynamically stable, awake   DISPOSITION: PACU   INDICATION(S) FOR PROCEDURE:  Patient is a 70 y.o. female who presented with above diagnosis.  All risks, benefits, and alternatives to above elective procedures were discussed with the patient, who elected to proceed, and informed consent was accordingly obtained at that time.  DETAILS OF PROCEDURE:  Patient was brought to the operative suite and appropriately identified.  Area was prepped and draped in the usual sterile fashion, and following a timeout, previous incision was opened and port noted to be within subcutaneous tissue.  Inspection of the port reservoir noted a acute angulation of the catheter at the reservoir insertion site.  Anchor sutures were removed and the catheter was straightened.  Pocket was widened laterally to accommodate the new positioning of the reservoir.  Under fluoroscopy, catheter was noted to have no acute angulations from the port reservoir all the way to the tip that was still within the appropriate positioning at the atriocaval junction.  Easy flushing blood draw was confirmed in this new position.  Port was then secured to the chest wall using 3-0 Prolene in 2 corners of the reservoir as well as at the catheter attachment site to prevent recurrent issues.  Port again noted to have easy flushing blood draw, and  hep-locked.  Wound was extensively irrigated, closed in a 2 layer fashion using 3-0 Vicryl in an interrupted fashion in the subdermal layer, 4-0 Monocryl in a running subcuticular fashion.  Wound was then dressed with benzoin and Steri-Strips, covered with 4 x 4 gauze and Tegaderm.  Patient was transferred to PACU in stable condition.  Sponge and instrument count correct at end of procedure.  Fluoroscopy imaging was stored in PACS.

## 2020-09-29 ENCOUNTER — Inpatient Hospital Stay: Payer: Medicare Other

## 2020-09-29 ENCOUNTER — Encounter: Payer: Self-pay | Admitting: Surgery

## 2020-09-29 ENCOUNTER — Ambulatory Visit
Admission: RE | Admit: 2020-09-29 | Discharge: 2020-09-29 | Disposition: A | Discharge status: Home / Self Care | Payer: Medicare Other | Source: Ambulatory Visit | Attending: Radiation Oncology | Admitting: Radiation Oncology

## 2020-09-29 DIAGNOSIS — C211 Malignant neoplasm of anal canal: Secondary | ICD-10-CM

## 2020-09-29 DIAGNOSIS — Z5111 Encounter for antineoplastic chemotherapy: Secondary | ICD-10-CM | POA: Diagnosis not present

## 2020-09-29 LAB — COMPREHENSIVE METABOLIC PANEL
ALT: 15 U/L (ref 0–44)
AST: 22 U/L (ref 15–41)
Albumin: 3.7 g/dL (ref 3.5–5.0)
Alkaline Phosphatase: 58 U/L (ref 38–126)
Anion gap: 10 (ref 5–15)
BUN: 15 mg/dL (ref 8–23)
CO2: 23 mmol/L (ref 22–32)
Calcium: 8.8 mg/dL — ABNORMAL LOW (ref 8.9–10.3)
Chloride: 105 mmol/L (ref 98–111)
Creatinine, Ser: 0.94 mg/dL (ref 0.44–1.00)
GFR, Estimated: >60 mL/min (ref >60)
Glucose, Bld: 121 mg/dL — ABNORMAL HIGH (ref 70–99)
Potassium: 4.2 mmol/L (ref 3.5–5.1)
Sodium: 138 mmol/L (ref 135–145)
Total Bilirubin: 0.3 mg/dL (ref 0.3–1.2)
Total Protein: 7.1 g/dL (ref 6.5–8.1)

## 2020-09-29 LAB — CBC WITH DIFFERENTIAL/PLATELET
Abs Immature Granulocytes: 0.03 10*3/uL (ref 0.00–0.07)
Basophils Absolute: 0 10*3/uL (ref 0.0–0.1)
Basophils Relative: 1 %
Eosinophils Absolute: 0.1 10*3/uL (ref 0.0–0.5)
Eosinophils Relative: 2 %
HCT: 29.8 % — ABNORMAL LOW (ref 36.0–46.0)
Hemoglobin: 9.9 g/dL — ABNORMAL LOW (ref 12.0–15.0)
Immature Granulocytes: 1 %
Lymphocytes Relative: 25 %
Lymphs Abs: 1.6 10*3/uL (ref 0.7–4.0)
MCH: 28.8 pg (ref 26.0–34.0)
MCHC: 33.2 g/dL (ref 30.0–36.0)
MCV: 86.6 fL (ref 80.0–100.0)
Monocytes Absolute: 0.3 10*3/uL (ref 0.1–1.0)
Monocytes Relative: 5 %
Neutro Abs: 4.3 10*3/uL (ref 1.7–7.7)
Neutrophils Relative %: 66 %
Platelets: 167 10*3/uL (ref 150–400)
RBC: 3.44 MIL/uL — ABNORMAL LOW (ref 3.87–5.11)
RDW: 16.9 % — ABNORMAL HIGH (ref 11.5–15.5)
WBC: 6.4 10*3/uL (ref 4.0–10.5)
nRBC: 0 % (ref 0.0–0.2)

## 2020-09-29 MED ORDER — PROCHLORPERAZINE MALEATE 10 MG PO TABS
10.0000 mg | ORAL_TABLET | Freq: Once | ORAL | Status: AC
  Administered 2020-09-29: 10 mg via ORAL
  Filled 2020-09-29: qty 1

## 2020-09-29 MED ORDER — SODIUM CHLORIDE 0.9 % IV SOLN
1000.0000 mg/m2/d | INTRAVENOUS | Status: DC
  Administered 2020-09-29: 6850 mg via INTRAVENOUS
  Filled 2020-09-29: qty 137

## 2020-09-29 MED ORDER — SODIUM CHLORIDE 0.9 % IV SOLN
Freq: Once | INTRAVENOUS | Status: AC
  Filled 2020-09-29: qty 250

## 2020-09-29 MED ORDER — MITOMYCIN CHEMO IV INJECTION 20 MG
10.0000 mg/m2 | Freq: Once | INTRAVENOUS | Status: AC
  Administered 2020-09-29: 17 mg via INTRAVENOUS
  Filled 2020-09-29: qty 34

## 2020-09-29 NOTE — Progress Notes (Signed)
Pump video shown to pt, spill kit given 

## 2020-10-02 ENCOUNTER — Ambulatory Visit: Payer: Medicare Other

## 2020-10-03 ENCOUNTER — Encounter: Payer: Self-pay | Admitting: Internal Medicine

## 2020-10-03 ENCOUNTER — Telehealth: Payer: Self-pay

## 2020-10-03 ENCOUNTER — Ambulatory Visit
Admission: RE | Admit: 2020-10-03 | Discharge: 2020-10-03 | Disposition: A | Discharge status: Home / Self Care | Payer: Medicare Other | Source: Ambulatory Visit | Attending: Radiation Oncology | Admitting: Radiation Oncology

## 2020-10-03 ENCOUNTER — Inpatient Hospital Stay: Payer: Medicare Other

## 2020-10-03 ENCOUNTER — Inpatient Hospital Stay (HOSPITAL_BASED_OUTPATIENT_CLINIC_OR_DEPARTMENT_OTHER): Payer: Medicare Other | Admitting: Internal Medicine

## 2020-10-03 DIAGNOSIS — Z5111 Encounter for antineoplastic chemotherapy: Secondary | ICD-10-CM | POA: Diagnosis not present

## 2020-10-03 DIAGNOSIS — C211 Malignant neoplasm of anal canal: Secondary | ICD-10-CM | POA: Diagnosis not present

## 2020-10-03 LAB — COMPREHENSIVE METABOLIC PANEL
ALT: 14 U/L (ref 0–44)
AST: 19 U/L (ref 15–41)
Albumin: 4.1 g/dL (ref 3.5–5.0)
Alkaline Phosphatase: 58 U/L (ref 38–126)
Anion gap: 12 (ref 5–15)
BUN: 19 mg/dL (ref 8–23)
CO2: 24 mmol/L (ref 22–32)
Calcium: 9.6 mg/dL (ref 8.9–10.3)
Chloride: 101 mmol/L (ref 98–111)
Creatinine, Ser: 1.35 mg/dL — ABNORMAL HIGH (ref 0.44–1.00)
GFR, Estimated: 42 mL/min — ABNORMAL LOW (ref >60)
Glucose, Bld: 118 mg/dL — ABNORMAL HIGH (ref 70–99)
Potassium: 4.2 mmol/L (ref 3.5–5.1)
Sodium: 137 mmol/L (ref 135–145)
Total Bilirubin: 0.8 mg/dL (ref 0.3–1.2)
Total Protein: 8 g/dL (ref 6.5–8.1)

## 2020-10-03 LAB — CBC WITH DIFFERENTIAL/PLATELET
Abs Immature Granulocytes: 0.05 10*3/uL (ref 0.00–0.07)
Basophils Absolute: 0 10*3/uL (ref 0.0–0.1)
Basophils Relative: 0 %
Eosinophils Absolute: 0 10*3/uL (ref 0.0–0.5)
Eosinophils Relative: 0 %
HCT: 34.4 % — ABNORMAL LOW (ref 36.0–46.0)
Hemoglobin: 11.5 g/dL — ABNORMAL LOW (ref 12.0–15.0)
Immature Granulocytes: 1 %
Lymphocytes Relative: 12 %
Lymphs Abs: 1 10*3/uL (ref 0.7–4.0)
MCH: 28.9 pg (ref 26.0–34.0)
MCHC: 33.4 g/dL (ref 30.0–36.0)
MCV: 86.4 fL (ref 80.0–100.0)
Monocytes Absolute: 0.2 10*3/uL (ref 0.1–1.0)
Monocytes Relative: 3 %
Neutro Abs: 6.6 10*3/uL (ref 1.7–7.7)
Neutrophils Relative %: 84 %
Platelets: 130 10*3/uL — ABNORMAL LOW (ref 150–400)
RBC: 3.98 MIL/uL (ref 3.87–5.11)
RDW: 16.9 % — ABNORMAL HIGH (ref 11.5–15.5)
WBC: 7.9 10*3/uL (ref 4.0–10.5)
nRBC: 0 % (ref 0.0–0.2)

## 2020-10-03 MED ORDER — HEPARIN SOD (PORK) LOCK FLUSH 100 UNIT/ML IV SOLN
500.0000 [IU] | Freq: Once | INTRAVENOUS | Status: AC | PRN
  Administered 2020-10-03: 500 [IU]
  Filled 2020-10-03: qty 5

## 2020-10-03 MED ORDER — SODIUM CHLORIDE 0.9 % IV SOLN
Freq: Once | INTRAVENOUS | Status: AC
Start: 2020-10-03 — End: 2020-10-03
  Filled 2020-10-03: qty 250

## 2020-10-03 MED ORDER — HEPARIN SOD (PORK) LOCK FLUSH 100 UNIT/ML IV SOLN
INTRAVENOUS | Status: AC
  Filled 2020-10-03: qty 5

## 2020-10-03 MED ORDER — SODIUM CHLORIDE 0.9 % IV SOLN
Freq: Once | INTRAVENOUS | Status: AC
  Filled 2020-10-03: qty 250

## 2020-10-03 MED ORDER — ONDANSETRON HCL 4 MG/2ML IJ SOLN
8.0000 mg | Freq: Once | INTRAMUSCULAR | Status: AC
Start: 2020-10-03 — End: 2020-10-03
  Administered 2020-10-03: 8 mg via INTRAVENOUS
  Filled 2020-10-03: qty 4

## 2020-10-03 MED ORDER — SODIUM CHLORIDE 0.9 % IV SOLN: Freq: Once | INTRAVENOUS | Status: DC

## 2020-10-03 MED ORDER — DEXAMETHASONE SODIUM PHOSPHATE 10 MG/ML IJ SOLN
10.0000 mg | Freq: Once | INTRAMUSCULAR | Status: AC
  Administered 2020-10-03: 10 mg via INTRAVENOUS
  Filled 2020-10-03: qty 1

## 2020-10-03 NOTE — Patient Instructions (Signed)
#   STOP linisopril until further recommendations [slightly low BP]

## 2020-10-03 NOTE — Assessment & Plan Note (Addendum)
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 Slight uptake in inguinal LN.   # currently s/p  Cycle #1 day-5- 5FU+Mitomycin; continue RT-patient tolerating chemotherapy well except for nausea vomiting diarrhea/borderline low blood pressure [see below].  # borderline LOW BP/dizzy-[sec to n/v/d]-recommend discontinuation of lisinopril.  Recommend IV fluids 1 L; also recommend dexamethasone 10 mg IV;   #Nausea vomiting-grade 2; recommend dexamethasone 10 mg IV push x1; also recommend Zofran as needed at home.  #Diarrhea-grade 2 from 5-FU-mitomycin recommend Imodium;   # Anal pain-recommend lidocaine gel for topical application prn-STABLE.   # COPD-on inhalers not on oxygen; STABLE; Need to monitor closely on mitomycin.  # DISPOSITION:  # IVFs over 1 hour;  Dex 10 mg x1  # labs- on 9/25- cbc/bmp; Possible IVFs # follow up on May 2nd- MD: labs- cbc/cmp; possible IVFs--Dr.B

## 2020-10-03 NOTE — Telephone Encounter (Signed)
T/C to patient for follow up after receiving first chemo.  Son answered the phone and states his mom was laying down sleeping.  States she has been sick and had to come into clinic today to have pump removed and had to get some IV fluids.   Encouraged son to call for any questions, concerns or if patient is not feeling any better.

## 2020-10-03 NOTE — Progress Notes (Signed)
[facility] NOTE  Patient Care Team: Letta Median, MD as PCP - General (Family Medicine) Clent Jacks, RN as Oncology Nurse Navigator Benjamine Sprague, DO as Consulting Physician (Surgery) Cammie Sickle, MD as Consulting Physician (Internal Medicine)  CHIEF COMPLAINTS/PURPOSE OF CONSULTATION: anal cancer  #  Oncology History Overview Note  # [facility] 2022- ANAL CA- EXCISIONAL BIOPSY:  - INVASIVE MODERATELY DIFFERENTIATED SQUAMOUS CELL CARCINOMA, PRESENT AT DEEP AND LATERAL TISSUE EDGES. [Dr.Sakai]  # PVD [s/p Stent; Dr.Dew]on Eliquis; active smoker;   # SURVIVORSHIP:   # GENETICS:   DIAGNOSIS:   STAGE:         ;  GOALS:  CURRENT/MOST RECENT THERAPY :     Cancer of anal canal ([place])  08/18/2020 Initial Diagnosis   Cancer of anal canal ([place])   09/29/2020 -  Chemotherapy    Patient is on Treatment Plan: ANUS MITOMYCIN D1,28 / 5FU D1-4, 28-31 Q32D         HISTORY OF PRESENTING ILLNESS:  Mariah Rogers 70 y.o.  female history of COPD; anal cancer [stafe II vs III] currently on chemoradiation is here for follow-up.  Patient started radiation on April 1; however because of Mediport dysfunction-started chemotherapy on April 8th.   Patient is today is yet to have her pump taken off.  She complains of dizziness.  She complains of poor p.o. intake.  Also complains of nausea vomiting-improved with antiemetics.  Also complains of diarrhea multiple loose stools-improved on Imodium.  No blood in stools.  Denies any significant pain.  Review of Systems  Constitutional: Positive for malaise/fatigue. Negative for chills, diaphoresis, fever and weight loss.  HENT: Negative for nosebleeds and sore throat.   Eyes: Negative for double vision.  Respiratory: Positive for shortness of breath. Negative for cough, hemoptysis, sputum production and wheezing.   Cardiovascular: Negative for chest pain, palpitations, orthopnea and leg swelling.   Gastrointestinal: Positive for diarrhea, nausea and vomiting. Negative for abdominal pain, constipation, heartburn and melena.  Genitourinary: Negative for dysuria, frequency and urgency.  Musculoskeletal: Positive for back pain and joint pain.  Skin: Negative.  Negative for itching and rash.  Neurological: Negative for dizziness, tingling, focal weakness, weakness and headaches.  Endo/Heme/Allergies: Does not bruise/bleed easily.  Psychiatric/Behavioral: Negative for depression. The patient is not nervous/anxious and does not have insomnia.      MEDICAL HISTORY:  Past Medical History:  Diagnosis Date  . Arthritis    LEFT KNEE   . CAD (coronary artery disease)   . COPD (chronic obstructive pulmonary disease) ([place])   . Depression   . Emphysema lung ([place])   . GERD (gastroesophageal reflux disease)   . Hernia, hiatal   . Hyperlipidemia   . Hypertension   . Peripheral vascular disease ([place])   . Pre-diabetes   . Thrombosis    in Lt leg    SURGICAL HISTORY: Past Surgical History:  Procedure Laterality Date  . ABDOMINAL AORTA STENT    . ABDOMINAL HYSTERECTOMY    . aortobifemoral bypass  2005  . CESAREAN SECTION     x 3  . COLONOSCOPY WITH PROPOFOL N/A 03/10/2019   Procedure: COLONOSCOPY WITH PROPOFOL;  Surgeon: Virgel Manifold, MD;  Location: [facility] ENDOSCOPY;  Service: Endoscopy;  Laterality: N/A;  . ESOPHAGOGASTRODUODENOSCOPY (EGD) WITH PROPOFOL N/A 03/10/2019   Procedure: ESOPHAGOGASTRODUODENOSCOPY (EGD) WITH PROPOFOL;  Surgeon: Virgel Manifold, MD;  Location: [facility] ENDOSCOPY;  Service: Endoscopy;  Laterality: N/A;  . EVALUATION UNDER ANESTHESIA WITH HEMORRHOIDECTOMY N/A  08/10/2020   Procedure: EXAM UNDER ANESTHESIA WITH HEMORRHOIDECTOMY;  Surgeon: Benjamine Sprague, DO;  Location: [facility] ORS;  Service: General;  Laterality: N/A;  . femoral stents    . LOWER EXTREMITY ANGIOGRAPHY Left 10/02/2016   Procedure: Lower Extremity Angiography;  Surgeon: Algernon Huxley, MD;  Location: [facility];  Service: Cardiovascular;  Laterality: Left;  . LOWER EXTREMITY ANGIOGRAPHY N/A 10/03/2016   Procedure: Lower Extremity Angiography with possible revascularization;  Surgeon: Algernon Huxley, MD;  Location: [facility];  Service: Cardiovascular;  Laterality: N/A;  . PORTACATH PLACEMENT N/A 09/13/2020   Procedure: INSERTION PORT-A-CATH;  Surgeon: Benjamine Sprague, DO;  Location: [facility] ORS;  Service: General;  Laterality: N/A;  . PORTACATH PLACEMENT Right 09/28/2020   Procedure: REVISION/REPOSITIONING OF PORT-A-CATH;  Surgeon: Benjamine Sprague, DO;  Location: [facility] ORS;  Service: General;  Laterality: Right;  Marland Kitchen VASCULAR SURGERY      SOCIAL HISTORY: Social History   Socioeconomic History  . Marital status: Widowed    Spouse name: Not on file  . Number of children: Not on file  . Years of education: Not on file  . Highest education level: Not on file  Occupational History  . Not on file  Tobacco Use  . Smoking status: Current Every Day Smoker    Packs/day: 0.25    Years: 60.00    Pack years: 15.00    Types: Cigarettes  . Smokeless tobacco: Never Used  Vaping Use  . Vaping Use: Never used  Substance and Sexual Activity  . Alcohol use: No  . Drug use: Yes    Types: Marijuana    Comment: 2-3 times a week- smoked last night 08/09/20  . Sexual activity: Never  Other Topics Concern  . Not on file  Social History Narrative   Lives in [place]. Smokes-1 pck in 3 days. No alcohol. Worked in [employer]. Son with her son.    Social Determinants of Health   Financial Resource Strain: Not on file  Food Insecurity: Not on file  Transportation Needs: Not on file  Physical Activity: Not on file  Stress: Not on file  Social Connections: Not on file  Intimate Partner Violence: Not on file    FAMILY HISTORY: Family History  Problem Relation Age of Onset  . Osteoporosis Mother   . Alzheimer's disease Mother   . Colon cancer Daughter 3    ALLERGIES:  is allergic to codeine  and metformin and related.  MEDICATIONS:  Current Outpatient Medications  Medication Sig Dispense Refill  . acetaminophen (TYLENOL) 325 MG tablet Take 2 tablets (650 mg total) by mouth every 8 (eight) hours as needed for mild pain. 40 tablet 0  . albuterol (VENTOLIN HFA) 108 (90 Base) MCG/ACT inhaler Inhale 2 puffs into the lungs every 6 (six) hours as needed for wheezing or shortness of breath.    Marland Kitchen apixaban (ELIQUIS) 5 MG TABS tablet Take 1 tablet (5 mg total) by mouth 2 (two) times daily. 90 tablet 4  . aspirin EC 81 MG EC tablet Take 1 tablet (81 mg total) by mouth daily. (Patient taking differently: Take 81 mg by mouth at bedtime.) 30 tablet 5  . cholecalciferol (VITAMIN D) 1000 units tablet Take 1,000 Units by mouth 2 (two) times daily.    . diphenhydrAMINE (BENADRYL) 25 MG tablet Take 50 mg by mouth at bedtime. Sleepaid    . docusate sodium (COLACE) 250 MG capsule Take 250 mg by mouth at bedtime.    Marland Kitchen ezetimibe-simvastatin (VYTORIN)  10-40 MG tablet Take 1 tablet by mouth at bedtime.    . Fluticasone-Salmeterol (ADVAIR) 250-50 MCG/DOSE AEPB Inhale 1 puff into the lungs daily.    . hydrocortisone 2.5 % cream Apply 1 application topically 2 (two) times daily as needed (itching).    Marland Kitchen ibuprofen (ADVIL) 400 MG tablet Take 1 tablet (400 mg total) by mouth every 8 (eight) hours as needed for mild pain or moderate pain. 30 tablet 0  . lidocaine (XYLOCAINE) 5 % ointment Apply 1 application topically every evening. 35.44 g 3  . lidocaine-prilocaine (EMLA) cream Apply 30 -45 mins prior to port access. (Patient taking differently: Apply 1 application topically daily as needed (Apply 30 -45 mins prior to port access.).) 30 g 0  . lisinopril (ZESTRIL) 20 MG tablet Take 20 mg by mouth daily.    . nortriptyline (PAMELOR) 10 MG capsule Take 10 mg by mouth daily.    Marland Kitchen omeprazole (PRILOSEC) 20 MG capsule Take 20 mg by mouth daily.    . ondansetron (ZOFRAN) 8 MG tablet One pill every 8 hours as needed for  nausea/vomitting. 40 tablet 1  . prochlorperazine (COMPAZINE) 10 MG tablet Take 1 tablet (10 mg total) by mouth every 6 (six) hours as needed for nausea or vomiting. 40 tablet 1  . sertraline (ZOLOFT) 100 MG tablet Take 100 mg by mouth 2 (two) times daily.     No current facility-administered medications for this visit.   Facility-Administered Medications Ordered in Other Visits  Medication Dose Route Frequency Provider Last Rate Last Admin  . 0.9 %  sodium chloride infusion   Intravenous Once Charlaine Dalton R, MD      . dexamethasone (DECADRON) injection 10 mg  10 mg Intravenous Once Cammie Sickle, MD          .  PHYSICAL EXAMINATION: ECOG PERFORMANCE STATUS: 1 - Symptomatic but completely ambulatory  Vitals:   10/03/20 1009  BP: 96/63  Pulse: 81  Resp: 20  Temp: (!) 96.8 F (36 C)   Filed Weights   10/03/20 1009  Weight: 149 lb 9.6 oz (67.9 kg)    Physical Exam Constitutional:      Comments: Alone.  Ambulating.   HENT:     Head: Normocephalic and atraumatic.     Mouth/Throat:     Pharynx: No oropharyngeal exudate.  Eyes:     Pupils: Pupils are equal, round, and reactive to light.  Cardiovascular:     Rate and Rhythm: Normal rate and regular rhythm.  Pulmonary:     Effort: No respiratory distress.     Breath sounds: No wheezing.     Comments: Decreased breath sounds at bases.  Abdominal:     General: Bowel sounds are normal. There is no distension.     Palpations: Abdomen is soft. There is no mass.     Tenderness: There is no abdominal tenderness. There is no guarding or rebound.  Musculoskeletal:        General: No tenderness. Normal range of motion.     Cervical back: Normal range of motion and neck supple.  Skin:    General: Skin is warm.  Neurological:     Mental Status: She is alert and oriented to person, place, and time.  Psychiatric:        Mood and Affect: Affect normal.      LABORATORY DATA:  I have reviewed the data as  listed Lab Results  Component Value Date   WBC 7.9 10/03/2020   HGB  11.5 (L) 10/03/2020   HCT 34.4 (L) 10/03/2020   MCV 86.4 10/03/2020   PLT 130 (L) 10/03/2020   Recent Labs    08/18/20 1442 09/20/20 0857 09/29/20 0803  NA 136 137 138  K 3.9 4.7 4.2  CL 101 102 105  CO2 22 26 23   GLUCOSE 127* 107* 121*  BUN 13 22 15   CREATININE 1.12* 1.04* 0.94  CALCIUM 9.1 9.7 8.8*  GFRNONAA 53* 58* >60  PROT 7.4 7.8 7.1  ALBUMIN 3.8 3.9 3.7  AST 21 25 22   ALT 17 21 15   ALKPHOS 77 73 58  BILITOT 0.4 0.4 0.3    RADIOGRAPHIC STUDIES: I have personally reviewed the radiological images as listed and agreed with the findings in the report. NM PET Image Initial (PI) Skull Base To Thigh  Result Date: 09/05/2020 CLINICAL DATA:  Initial treatment strategy for anal cancer. EXAM: NUCLEAR MEDICINE PET SKULL BASE TO THIGH TECHNIQUE: 7.8 mCi F-18 FDG was injected intravenously. Full-ring PET imaging was performed from the skull base to thigh after the radiotracer. CT data was obtained and used for attenuation correction and anatomic localization. Fasting blood glucose: 104 mg/dl COMPARISON:  None. FINDINGS: Mediastinal blood pool activity: SUV max 2.7 Liver activity: SUV max NA NECK: No hypermetabolic lymph nodes in the neck. Incidental CT findings: none CHEST: No hypermetabolic mediastinal or hilar nodes. No suspicious pulmonary nodules on the CT scan. Incidental CT findings: Coronary artery calcification is evident. Atherosclerotic calcification is noted in the wall of the thoracic aorta. Moderate hiatal hernia. Centrilobular emphsyema noted. 7 mm sub solid nodule in the right lower lobe appears similar to and was better characterized on lung cancer screening CT of 11/24/2019. ABDOMEN/PELVIS: No abnormal hypermetabolic activity within the liver, pancreas, adrenal glands, or spleen. No hypermetabolic lymph nodes in the abdomen. Intense hypermetabolism is identified in the region of the anus with SUV max =  14.3. Small lymph nodes are identified in both inguinal regions, measuring 7 mm short axis on the right (224/3) and 7 mm short axis on the left (227/3). Both of these lymph nodes show low level FDG accumulation with SUV max = 1.6. Incidental CT findings: Low-attenuation thickening of the left adrenal gland without hypermetabolism suggest hyperplasia. Sequelae of aortobifemoral bypass grafting evident. SKELETON: No focal hypermetabolic activity to suggest skeletal metastasis. Incidental CT findings: none IMPRESSION: 1. Marked hypermetabolism in the anal region consistent with the reported history of anal cancer. 2. Tiny bilateral inguinal lymph nodes show discernible FDG accumulation despite their tiny size. As anal lesions distal to the dentate line can drain to the inguinal nodes, correlation with lesion location recommended and close attention on follow-up as metastatic disease not excluded. 3. 7 mm sub solid nodule right lower lobe without substantial change since lung cancer screening CT 11/24/2019. No hypermetabolism on PET imaging today. 4.  Aortic Atherosclerois (ICD10-170.0) 5.  Emphysema. (AGT36-I68.9) Electronically Signed   By: Misty Stanley M.D.   On: 09/05/2020 10:51   DG CHEST PORT 1 VIEW  Result Date: 09/13/2020 CLINICAL DATA:  Port-A-Cath placement.  Anal carcinoma EXAM: PORTABLE CHEST 1 VIEW COMPARISON:  PET-CT September 04, 2020 FINDINGS: Port-A-Cath tip at cavoatrial junction. No pneumothorax. There is no edema or airspace opacity. Heart size and pulmonary vascular normal. No adenopathy. There is a focal hiatal hernia. There is aortic atherosclerosis. No bone lesions. IMPRESSION: Port-A-Cath tip at cavoatrial junction. No pneumothorax. Lungs clear. Heart size within normal limits. No adenopathy. Hiatal hernia present. Aortic Atherosclerosis (ICD10-I70.0). Electronically Signed  By: Lowella Grip III M.D.   On: 09/13/2020 16:15   DG C-Arm 1-60 Min-No Report  Result Date:  09/28/2020 Fluoroscopy was utilized by the requesting physician.  No radiographic interpretation.   DG C-Arm 1-60 Min-No Report  Result Date: 09/13/2020 Fluoroscopy was utilized by the requesting physician.  No radiographic interpretation.    ASSESSMENT & PLAN:   Cancer of anal canal ([facility]) #Anal cancer squamous cell- [clinical T2N0 or T2N1] PET scan-? Slight uptake in inguinal LN.   # currently s/p  Cycle #1 day-5- 5FU+Mitomycin; continue RT-patient tolerating chemotherapy well except for nausea vomiting diarrhea/borderline low blood pressure [see below].  # borderline LOW BP/dizzy-[sec to n/v/d]-recommend discontinuation of lisinopril.  Recommend IV fluids 1 L; also recommend dexamethasone 10 mg IV;   #Nausea vomiting-grade 2; recommend dexamethasone 10 mg IV push x1; also recommend Zofran as needed at home.  #Diarrhea-grade 2 from 5-FU-mitomycin recommend Imodium;   # Anal pain-recommend lidocaine gel for topical application prn-STABLE.   # COPD-on inhalers not on oxygen; STABLE; Need to monitor closely on mitomycin.  # DISPOSITION:  # IVFs over 1 hour;  Dex 10 mg x1  # labs- on 9/25- cbc/bmp; Possible IVFs # follow up on May 2nd- MD: labs- cbc/cmp; possible IVFs--Dr.B  All questions were answered. The patient knows to call the clinic with any problems, questions or concerns.    Cammie Sickle, MD 10/03/2020 10:42 AM

## 2020-10-04 ENCOUNTER — Ambulatory Visit
Admission: RE | Admit: 2020-10-04 | Discharge: 2020-10-04 | Disposition: A | Discharge status: Home / Self Care | Payer: Medicare Other | Source: Ambulatory Visit | Attending: Radiation Oncology | Admitting: Radiation Oncology

## 2020-10-04 DIAGNOSIS — C211 Malignant neoplasm of anal canal: Secondary | ICD-10-CM | POA: Diagnosis not present

## 2020-10-05 ENCOUNTER — Ambulatory Visit
Admission: RE | Admit: 2020-10-05 | Discharge: 2020-10-05 | Disposition: A | Discharge status: Home / Self Care | Payer: Medicare Other | Source: Ambulatory Visit | Attending: Radiation Oncology | Admitting: Radiation Oncology

## 2020-10-05 DIAGNOSIS — C211 Malignant neoplasm of anal canal: Secondary | ICD-10-CM | POA: Diagnosis not present

## 2020-10-06 ENCOUNTER — Ambulatory Visit
Admission: RE | Admit: 2020-10-06 | Discharge: 2020-10-06 | Disposition: A | Discharge status: Home / Self Care | Payer: Medicare Other | Source: Ambulatory Visit | Attending: Radiation Oncology | Admitting: Radiation Oncology

## 2020-10-06 DIAGNOSIS — C211 Malignant neoplasm of anal canal: Secondary | ICD-10-CM | POA: Diagnosis not present

## 2020-10-09 ENCOUNTER — Inpatient Hospital Stay: Payer: Medicare Other

## 2020-10-09 ENCOUNTER — Ambulatory Visit: Payer: Medicare Other

## 2020-10-10 ENCOUNTER — Ambulatory Visit: Payer: Medicare Other

## 2020-10-11 ENCOUNTER — Inpatient Hospital Stay: Payer: Medicare Other

## 2020-10-11 ENCOUNTER — Other Ambulatory Visit: Payer: Self-pay

## 2020-10-11 ENCOUNTER — Inpatient Hospital Stay (HOSPITAL_BASED_OUTPATIENT_CLINIC_OR_DEPARTMENT_OTHER): Payer: Medicare Other | Admitting: Hospice and Palliative Medicine

## 2020-10-11 ENCOUNTER — Encounter: Payer: Medicare Other | Admitting: Hospice and Palliative Medicine

## 2020-10-11 ENCOUNTER — Ambulatory Visit: Admission: RE | Admit: 2020-10-11 | Payer: Medicare Other | Source: Ambulatory Visit

## 2020-10-11 ENCOUNTER — Ambulatory Visit: Payer: Medicare Other

## 2020-10-11 ENCOUNTER — Encounter (INDEPENDENT_AMBULATORY_CARE_PROVIDER_SITE_OTHER): Payer: Self-pay

## 2020-10-11 VITALS — BP 115/61 | HR 76 | Temp 99.7°F | Resp 18 | Wt 147.3 lb

## 2020-10-11 DIAGNOSIS — R197 Diarrhea, unspecified: Secondary | ICD-10-CM

## 2020-10-11 DIAGNOSIS — G893 Neoplasm related pain (acute) (chronic): Secondary | ICD-10-CM | POA: Diagnosis not present

## 2020-10-11 DIAGNOSIS — C211 Malignant neoplasm of anal canal: Secondary | ICD-10-CM | POA: Diagnosis not present

## 2020-10-11 DIAGNOSIS — Z5111 Encounter for antineoplastic chemotherapy: Secondary | ICD-10-CM | POA: Diagnosis not present

## 2020-10-11 LAB — CBC WITH DIFFERENTIAL/PLATELET
Abs Immature Granulocytes: 0.02 10*3/uL (ref 0.00–0.07)
Basophils Absolute: 0 10*3/uL (ref 0.0–0.1)
Basophils Relative: 0 %
Eosinophils Absolute: 0.3 10*3/uL (ref 0.0–0.5)
Eosinophils Relative: 5 %
HCT: 28.6 % — ABNORMAL LOW (ref 36.0–46.0)
Hemoglobin: 9.6 g/dL — ABNORMAL LOW (ref 12.0–15.0)
Immature Granulocytes: 0 %
Lymphocytes Relative: 19 %
Lymphs Abs: 1 10*3/uL (ref 0.7–4.0)
MCH: 29.2 pg (ref 26.0–34.0)
MCHC: 33.6 g/dL (ref 30.0–36.0)
MCV: 86.9 fL (ref 80.0–100.0)
Monocytes Absolute: 0.4 10*3/uL (ref 0.1–1.0)
Monocytes Relative: 7 %
Neutro Abs: 3.8 10*3/uL (ref 1.7–7.7)
Neutrophils Relative %: 69 %
Platelets: 183 10*3/uL (ref 150–400)
RBC: 3.29 MIL/uL — ABNORMAL LOW (ref 3.87–5.11)
RDW: 17.9 % — ABNORMAL HIGH (ref 11.5–15.5)
WBC: 5.4 10*3/uL (ref 4.0–10.5)
nRBC: 0 % (ref 0.0–0.2)

## 2020-10-11 LAB — COMPREHENSIVE METABOLIC PANEL
ALT: 18 U/L (ref 0–44)
AST: 20 U/L (ref 15–41)
Albumin: 3.5 g/dL (ref 3.5–5.0)
Alkaline Phosphatase: 53 U/L (ref 38–126)
Anion gap: 9 (ref 5–15)
BUN: 12 mg/dL (ref 8–23)
CO2: 24 mmol/L (ref 22–32)
Calcium: 8.6 mg/dL — ABNORMAL LOW (ref 8.9–10.3)
Chloride: 103 mmol/L (ref 98–111)
Creatinine, Ser: 0.98 mg/dL (ref 0.44–1.00)
GFR, Estimated: >60 mL/min (ref >60)
Glucose, Bld: 147 mg/dL — ABNORMAL HIGH (ref 70–99)
Potassium: 3.7 mmol/L (ref 3.5–5.1)
Sodium: 136 mmol/L (ref 135–145)
Total Bilirubin: 0.4 mg/dL (ref 0.3–1.2)
Total Protein: 6.6 g/dL (ref 6.5–8.1)

## 2020-10-11 LAB — MAGNESIUM: Magnesium: 1.6 mg/dL — ABNORMAL LOW (ref 1.7–2.4)

## 2020-10-11 MED ORDER — HEPARIN SOD (PORK) LOCK FLUSH 100 UNIT/ML IV SOLN
500.0000 [IU] | Freq: Once | INTRAVENOUS | Status: AC | PRN
  Administered 2020-10-11: 500 [IU]
  Filled 2020-10-11: qty 5

## 2020-10-11 MED ORDER — DIPHENOXYLATE-ATROPINE 2.5-0.025 MG PO TABS: 1.0000 | ORAL_TABLET | Freq: Four times a day (QID) | ORAL | 0 refills | Status: DC | PRN

## 2020-10-11 MED ORDER — PROCTOFOAM HC 1-1 % EX FOAM: 1.0000 | Freq: Three times a day (TID) | CUTANEOUS | 1 refills | Status: DC

## 2020-10-11 MED ORDER — SODIUM CHLORIDE 0.9% FLUSH
10.0000 mL | Freq: Once | INTRAVENOUS | Status: AC | PRN
  Administered 2020-10-11: 10 mL
  Filled 2020-10-11: qty 10

## 2020-10-11 MED ORDER — MAGIC MOUTHWASH: 5.0000 mL | Freq: Three times a day (TID) | ORAL | 0 refills | Status: DC | PRN

## 2020-10-11 MED ORDER — OXYCODONE HCL 5 MG PO TABS: 5.0000 mg | ORAL_TABLET | ORAL | 0 refills | Status: DC | PRN

## 2020-10-11 MED ORDER — SODIUM CHLORIDE 0.9 % IV SOLN
INTRAVENOUS | Status: DC
  Filled 2020-10-11 (×2): qty 250

## 2020-10-11 NOTE — Progress Notes (Signed)
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  Patient is status post cycle 1 day 5 of F5U+ mitomycin on 10/03/2020.  She has had grade 2 nausea, vomiting and diarrhea secondary to chemotherapy and was seen by Dr. Rogue Bussing on 10/03/2020 for same.  She received IV fluids and IV dexamethasone in the clinic.  She is recommended to take ondansetron and Imodium at home.  Patient now presents to the [facility] for evaluation of diarrhea and development of oral sores.  Symptoms have caused her to miss the past 2 days of XRT.  Patient says that she is having multiple loose stools a day.  She has severe burning and pain in her rectal area.  She is applying topical lidocaine and hydrocortisone cream without improvement in pain.  Denies any neurologic complaints. Denies recent fevers or illnesses. Denies any easy bleeding or bruising. Reports good appetite and denies weight loss. Denies chest pain. Denies any nausea, vomiting. Denies urinary complaints. Patient offers no further specific complaints today.   PAST MEDICAL HISTORY: Past Medical History:  Diagnosis Date  . Arthritis    LEFT KNEE   . CAD (coronary artery disease)   . COPD (chronic obstructive pulmonary disease) ([place])   . Depression   . Emphysema lung ([place])   . GERD (gastroesophageal reflux disease)   . Hernia, hiatal   . Hyperlipidemia   . Hypertension   . Peripheral vascular  disease ([place])   . Pre-diabetes   . Thrombosis    in Lt leg    PAST SURGICAL HISTORY:  Past Surgical History:  Procedure Laterality Date  . ABDOMINAL AORTA STENT    . ABDOMINAL HYSTERECTOMY    . aortobifemoral bypass  2005  . CESAREAN SECTION     x 3  . COLONOSCOPY WITH PROPOFOL N/A 03/10/2019   Procedure: COLONOSCOPY WITH PROPOFOL;  Surgeon: Virgel Manifold, MD;  Location: [facility] ENDOSCOPY;  Service: Endoscopy;  Laterality: N/A;  . ESOPHAGOGASTRODUODENOSCOPY (EGD) WITH PROPOFOL N/A 03/10/2019   Procedure: ESOPHAGOGASTRODUODENOSCOPY (EGD) WITH PROPOFOL;  Surgeon: Virgel Manifold, MD;  Location: [facility] ENDOSCOPY;  Service: Endoscopy;  Laterality: N/A;  . EVALUATION UNDER ANESTHESIA WITH HEMORRHOIDECTOMY N/A 08/10/2020   Procedure: EXAM UNDER ANESTHESIA WITH HEMORRHOIDECTOMY;  Surgeon: Benjamine Sprague, DO;  Location: [facility] ORS;  Service: General;  Laterality: N/A;  . femoral stents    . LOWER EXTREMITY ANGIOGRAPHY Left 10/02/2016   Procedure: Lower Extremity Angiography;  Surgeon: Algernon Huxley, MD;  Location: [place] CV LAB;  Service: Cardiovascular;  Laterality: Left;  . LOWER EXTREMITY ANGIOGRAPHY N/A 10/03/2016   Procedure: Lower Extremity Angiography with possible revascularization;  Surgeon: Algernon Huxley, MD;  Location: [place] CV LAB;  Service: Cardiovascular;  Laterality: N/A;  . PORTACATH PLACEMENT N/A 09/13/2020   Procedure: INSERTION PORT-A-CATH;  Surgeon: Benjamine Sprague, DO;  Location: [facility] ORS;  Service: General;  Laterality: N/A;  . PORTACATH PLACEMENT Right 09/28/2020   Procedure: REVISION/REPOSITIONING OF PORT-A-CATH;  Surgeon: Benjamine Sprague, DO;  Location: [place]  ORS;  Service: General;  Laterality: Right;  Marland Kitchen VASCULAR SURGERY      HEMATOLOGY/ONCOLOGY HISTORY:  Oncology History Overview Note  # [facility] 2022- ANAL CA- EXCISIONAL BIOPSY:  - INVASIVE MODERATELY DIFFERENTIATED SQUAMOUS CELL CARCINOMA, PRESENT AT DEEP AND LATERAL TISSUE EDGES. [Dr.Sakai]  # PVD [s/p  Stent; Dr.Dew]on Eliquis; active smoker;   # SURVIVORSHIP:   # GENETICS:   DIAGNOSIS:   STAGE:         ;  GOALS:  CURRENT/MOST RECENT THERAPY :     Cancer of anal canal ([place])  08/18/2020 Initial Diagnosis   Cancer of anal canal ([place])   09/29/2020 -  Chemotherapy    Patient is on Treatment Plan: ANUS MITOMYCIN D1,28 / 5FU D1-4, 28-31 Q32D        ALLERGIES:  is allergic to codeine and metformin and related.  MEDICATIONS:  Current Outpatient Medications  Medication Sig Dispense Refill  . acetaminophen (TYLENOL) 325 MG tablet Take 2 tablets (650 mg total) by mouth every 8 (eight) hours as needed for mild pain. 40 tablet 0  . albuterol (VENTOLIN HFA) 108 (90 Base) MCG/ACT inhaler Inhale 2 puffs into the lungs every 6 (six) hours as needed for wheezing or shortness of breath.    Marland Kitchen apixaban (ELIQUIS) 5 MG TABS tablet Take 1 tablet (5 mg total) by mouth 2 (two) times daily. 90 tablet 4  . aspirin EC 81 MG EC tablet Take 1 tablet (81 mg total) by mouth daily. (Patient taking differently: Take 81 mg by mouth at bedtime.) 30 tablet 5  . cholecalciferol (VITAMIN D) 1000 units tablet Take 1,000 Units by mouth 2 (two) times daily.    . diphenhydrAMINE (BENADRYL) 25 MG tablet Take 50 mg by mouth at bedtime. Sleepaid    . docusate sodium (COLACE) 250 MG capsule Take 250 mg by mouth at bedtime.    Marland Kitchen ezetimibe-simvastatin (VYTORIN) 10-40 MG tablet Take 1 tablet by mouth at bedtime.    . Fluticasone-Salmeterol (ADVAIR) 250-50 MCG/DOSE AEPB Inhale 1 puff into the lungs daily.    . hydrocortisone 2.5 % cream Apply 1 application topically 2 (two) times daily as needed (itching).    Marland Kitchen ibuprofen (ADVIL) 400 MG tablet Take 1 tablet (400 mg total) by mouth every 8 (eight) hours as needed for mild pain or moderate pain. 30 tablet 0  . lidocaine (XYLOCAINE) 5 % ointment Apply 1 application topically every evening. 35.44 g 3  . lidocaine-prilocaine (EMLA) cream Apply 30 -45 mins prior to port access.  (Patient taking differently: Apply 1 application topically daily as needed (Apply 30 -45 mins prior to port access.).) 30 g 0  . lisinopril (ZESTRIL) 20 MG tablet Take 20 mg by mouth daily.    . nortriptyline (PAMELOR) 10 MG capsule Take 10 mg by mouth daily.    Marland Kitchen omeprazole (PRILOSEC) 20 MG capsule Take 20 mg by mouth daily.    . ondansetron (ZOFRAN) 8 MG tablet One pill every 8 hours as needed for nausea/vomitting. 40 tablet 1  . prochlorperazine (COMPAZINE) 10 MG tablet Take 1 tablet (10 mg total) by mouth every 6 (six) hours as needed for nausea or vomiting. 40 tablet 1  . sertraline (ZOLOFT) 100 MG tablet Take 100 mg by mouth 2 (two) times daily.     No current facility-administered medications for this visit.    VITAL SIGNS: There were no vitals taken for this visit. There were no vitals filed for this visit.  Estimated body mass index is 27.36 kg/m  as calculated from the following:   Height as of 09/28/20: 5\' 2"  (1.575 m).   Weight as of 10/03/20: 149 lb 9.6 oz (67.9 kg).  LABS: CBC:    Component Value Date/Time   WBC 7.9 10/03/2020 0950   HGB 11.5 (L) 10/03/2020 0950   HCT 34.4 (L) 10/03/2020 0950   PLT 130 (L) 10/03/2020 0950   MCV 86.4 10/03/2020 0950   NEUTROABS 6.6 10/03/2020 0950   NEUTROABS 5 03/22/2015 0000   LYMPHSABS 1.0 10/03/2020 0950   MONOABS 0.2 10/03/2020 0950   EOSABS 0.0 10/03/2020 0950   BASOSABS 0.0 10/03/2020 0950   Comprehensive Metabolic Panel:    Component Value Date/Time   NA 137 10/03/2020 0950   NA 142 03/22/2015 0000   K 4.2 10/03/2020 0950   CL 101 10/03/2020 0950   CO2 24 10/03/2020 0950   BUN 19 10/03/2020 0950   BUN 13 03/22/2015 0000   CREATININE 1.35 (H) 10/03/2020 0950   GLUCOSE 118 (H) 10/03/2020 0950   CALCIUM 9.6 10/03/2020 0950   AST 19 10/03/2020 0950   ALT 14 10/03/2020 0950   ALKPHOS 58 10/03/2020 0950   BILITOT 0.8 10/03/2020 0950   PROT 8.0 10/03/2020 0950   ALBUMIN 4.1 10/03/2020 0950    RADIOGRAPHIC  STUDIES: DG CHEST PORT 1 VIEW  Result Date: 09/13/2020 CLINICAL DATA:  Port-A-Cath placement.  Anal carcinoma EXAM: PORTABLE CHEST 1 VIEW COMPARISON:  PET-CT September 04, 2020 FINDINGS: Port-A-Cath tip at cavoatrial junction. No pneumothorax. There is no edema or airspace opacity. Heart size and pulmonary vascular normal. No adenopathy. There is a focal hiatal hernia. There is aortic atherosclerosis. No bone lesions. IMPRESSION: Port-A-Cath tip at cavoatrial junction. No pneumothorax. Lungs clear. Heart size within normal limits. No adenopathy. Hiatal hernia present. Aortic Atherosclerosis (ICD10-I70.0). Electronically Signed   By: Lowella Grip III M.D.   On: 09/13/2020 16:15   DG C-Arm 1-60 Min-No Report  Result Date: 09/28/2020 Fluoroscopy was utilized by the requesting physician.  No radiographic interpretation.   DG C-Arm 1-60 Min-No Report  Result Date: 09/13/2020 Fluoroscopy was utilized by the requesting physician.  No radiographic interpretation.    PERFORMANCE STATUS (ECOG) : 1 - Symptomatic but completely ambulatory  Review of Systems Unless otherwise noted, a complete review of systems is negative.  Physical Exam General: NAD Cardiovascular: regular rate and rhythm Pulmonary: clear ant fields Abdomen: soft, nontender, + bowel sounds, rectal exam declined GU: no suprapubic tenderness Extremities: no edema, no joint deformities Skin: no rashes Neurological: Weakness but otherwise nonfocal  Assessment and Plan- Patient is a 70 y.o. female with COPD, PVD, and stage II or III anal cancer on treatment with chemoradiation who presents to the clinic with diarrhea, rectal pain, and oral sores.  Diarrhea - chemo induced.  Patient has been taking Imodium without good effect.  We will start Lomotil. IV fluids today. Check labs - CBC, CMP, Mg.   Rectal pain - secondary to radiation and diarrhea from chemotherapy. Will start proctofoam HC. Also will send Rx for oxycodone 5mg  Q6H PRN,  #45. Symptoms should improve with time now that patient has completed XRT. We discussed safe administration and storage of opioid pain medications. PDMP reviewed.   Oral Mucositis - secondary to chemotherapy. Magic Mouthwash TID PRN.   RTC as needed. Appointment with Dr. Rogue Bussing as previously scheduled.    Patient expressed understanding and was in agreement with this plan. She also understands that She can call clinic at any time with any questions, concerns,  or complaints.   Thank you for allowing me to participate in the care of this very pleasant patient.   Time Total: 25 minutes  Visit consisted of counseling and education dealing with the complex and emotionally intense issues of symptom management and palliative care in the setting of serious and potentially life-threatening illness.Greater than 50%  of this time was spent counseling and coordinating care related to the above assessment and plan.  Signed by: Altha Harm, PhD, NP-C

## 2020-10-12 ENCOUNTER — Ambulatory Visit: Payer: Medicare Other

## 2020-10-13 ENCOUNTER — Ambulatory Visit: Payer: Medicare Other

## 2020-10-16 ENCOUNTER — Inpatient Hospital Stay: Payer: Medicare Other | Attending: Internal Medicine

## 2020-10-16 ENCOUNTER — Ambulatory Visit
Admission: RE | Admit: 2020-10-16 | Discharge: 2020-10-16 | Disposition: A | Discharge status: Home / Self Care | Payer: Medicare Other | Source: Ambulatory Visit | Attending: Radiation Oncology | Admitting: Radiation Oncology

## 2020-10-16 ENCOUNTER — Inpatient Hospital Stay (HOSPITAL_BASED_OUTPATIENT_CLINIC_OR_DEPARTMENT_OTHER): Payer: Medicare Other | Admitting: Internal Medicine

## 2020-10-16 ENCOUNTER — Inpatient Hospital Stay: Payer: Medicare Other

## 2020-10-16 ENCOUNTER — Other Ambulatory Visit: Payer: Self-pay

## 2020-10-16 DIAGNOSIS — C211 Malignant neoplasm of anal canal: Secondary | ICD-10-CM | POA: Diagnosis present

## 2020-10-16 DIAGNOSIS — Z5111 Encounter for antineoplastic chemotherapy: Secondary | ICD-10-CM | POA: Diagnosis present

## 2020-10-16 DIAGNOSIS — R197 Diarrhea, unspecified: Secondary | ICD-10-CM | POA: Insufficient documentation

## 2020-10-16 DIAGNOSIS — Z79899 Other long term (current) drug therapy: Secondary | ICD-10-CM | POA: Insufficient documentation

## 2020-10-16 DIAGNOSIS — R110 Nausea: Secondary | ICD-10-CM | POA: Insufficient documentation

## 2020-10-16 LAB — CBC WITH DIFFERENTIAL/PLATELET
Abs Immature Granulocytes: 0.04 10*3/uL (ref 0.00–0.07)
Basophils Absolute: 0 10*3/uL (ref 0.0–0.1)
Basophils Relative: 0 %
Eosinophils Absolute: 0.3 10*3/uL (ref 0.0–0.5)
Eosinophils Relative: 6 %
HCT: 29.3 % — ABNORMAL LOW (ref 36.0–46.0)
Hemoglobin: 9.9 g/dL — ABNORMAL LOW (ref 12.0–15.0)
Immature Granulocytes: 1 %
Lymphocytes Relative: 17 %
Lymphs Abs: 0.8 10*3/uL (ref 0.7–4.0)
MCH: 29.8 pg (ref 26.0–34.0)
MCHC: 33.8 g/dL (ref 30.0–36.0)
MCV: 88.3 fL (ref 80.0–100.0)
Monocytes Absolute: 0.3 10*3/uL (ref 0.1–1.0)
Monocytes Relative: 6 %
Neutro Abs: 3.5 10*3/uL (ref 1.7–7.7)
Neutrophils Relative %: 70 %
Platelets: 209 10*3/uL (ref 150–400)
RBC: 3.32 MIL/uL — ABNORMAL LOW (ref 3.87–5.11)
RDW: 18.8 % — ABNORMAL HIGH (ref 11.5–15.5)
WBC: 5 10*3/uL (ref 4.0–10.5)
nRBC: 0 % (ref 0.0–0.2)

## 2020-10-16 LAB — COMPREHENSIVE METABOLIC PANEL
ALT: 15 U/L (ref 0–44)
AST: 23 U/L (ref 15–41)
Albumin: 3.5 g/dL (ref 3.5–5.0)
Alkaline Phosphatase: 59 U/L (ref 38–126)
Anion gap: 12 (ref 5–15)
BUN: 12 mg/dL (ref 8–23)
CO2: 24 mmol/L (ref 22–32)
Calcium: 9 mg/dL (ref 8.9–10.3)
Chloride: 101 mmol/L (ref 98–111)
Creatinine, Ser: 1.04 mg/dL — ABNORMAL HIGH (ref 0.44–1.00)
GFR, Estimated: 58 mL/min — ABNORMAL LOW (ref >60)
Glucose, Bld: 142 mg/dL — ABNORMAL HIGH (ref 70–99)
Potassium: 4.1 mmol/L (ref 3.5–5.1)
Sodium: 137 mmol/L (ref 135–145)
Total Bilirubin: 0.3 mg/dL (ref 0.3–1.2)
Total Protein: 6.8 g/dL (ref 6.5–8.1)

## 2020-10-16 MED ORDER — SODIUM CHLORIDE 0.9 % IV SOLN
Freq: Once | INTRAVENOUS | Status: AC
  Filled 2020-10-16: qty 250

## 2020-10-16 MED ORDER — SODIUM CHLORIDE 0.9% FLUSH
10.0000 mL | Freq: Once | INTRAVENOUS | Status: AC | PRN
  Administered 2020-10-16: 10 mL
  Filled 2020-10-16: qty 10

## 2020-10-16 MED ORDER — HEPARIN SOD (PORK) LOCK FLUSH 100 UNIT/ML IV SOLN
500.0000 [IU] | Freq: Once | INTRAVENOUS | Status: AC | PRN
  Administered 2020-10-16: 500 [IU]
  Filled 2020-10-16: qty 5

## 2020-10-16 NOTE — Assessment & Plan Note (Addendum)
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 Slight uptake in inguinal LN.   # currently s/p  Cycle #1 day-5- 5FU+Mitomycin; day# 17-; continue RT-patient tolerating chemotherapy well except for diarrhea[see below].  # borderline LOW BP/dizzy-[sec to n/v/d]-recommend discontinuation of lisinopril.  Recommend IV fluids 1 L; also recommend dexamethasone 10 mg IV.  Patient declines any scheduled appointments for IV hydration.  #Diarrhea-grade 2 from 5-FU-mitomycin recommend Imodium;   # Anal pain-recommend lidocaine gel for topical application prn-STABLE.   # COPD-on inhalers not on oxygen; STABLE;  Need to monitor closely on mitomycin.  # DISPOSITION:  # today IVFs over 1 hour;  # follow up on May 16th-X- MD: labs- cbc/cmp; 5FU-Mitomycin;  possible IVFs; pump off D-4--Dr.B

## 2020-10-16 NOTE — Progress Notes (Signed)
Pt received 1 liter of NS for hydration. Pt was discharged to home at completion of fluids.

## 2020-10-16 NOTE — Progress Notes (Signed)
[place] NOTE  Patient Care Team: Letta Median, MD as PCP - General (Family Medicine) Clent Jacks, RN as Oncology Nurse Navigator Benjamine Sprague, DO as Consulting Physician (Surgery) Cammie Sickle, MD as Consulting Physician (Internal Medicine)  CHIEF COMPLAINTS/PURPOSE OF CONSULTATION: anal cancer  #  Oncology History Overview Note  # [facility] 2022- ANAL CA- EXCISIONAL BIOPSY:  - INVASIVE MODERATELY DIFFERENTIATED SQUAMOUS CELL CARCINOMA, PRESENT AT DEEP AND LATERAL TISSUE EDGES. [Dr.Sakai]  # PVD [s/p Stent; Dr.Dew]on Eliquis; active smoker;   # SURVIVORSHIP:   # GENETICS:   DIAGNOSIS:   STAGE:         ;  GOALS:  CURRENT/MOST RECENT THERAPY :     Cancer of anal canal ([place])  08/18/2020 Initial Diagnosis   Cancer of anal canal ([place])   09/29/2020 -  Chemotherapy    Patient is on Treatment Plan: ANUS MITOMYCIN D1,28 / 5FU D1-4, 28-31 Q32D         HISTORY OF PRESENTING ILLNESS:  Mariah Rogers 70 y.o.  female history of COPD; anal cancer [stafe II vs III] currently on chemoradiation is here for follow-up.  Patient complains of diarrhea 2-3 loose stools a day.  She has been taking Imodium.  Denies any nausea vomiting.  Denies any chills.  No worsening sores in the mouth.   Review of Systems  Constitutional: Positive for malaise/fatigue. Negative for chills, diaphoresis, fever and weight loss.  HENT: Negative for nosebleeds and sore throat.   Eyes: Negative for double vision.  Respiratory: Positive for shortness of breath. Negative for cough, hemoptysis, sputum production and wheezing.   Cardiovascular: Negative for chest pain, palpitations, orthopnea and leg swelling.  Gastrointestinal: Positive for diarrhea. Negative for abdominal pain, constipation, heartburn and melena.  Genitourinary: Negative for dysuria, frequency and urgency.  Musculoskeletal: Positive for back pain and joint pain.  Skin: Negative.  Negative for  itching and rash.  Neurological: Negative for dizziness, tingling, focal weakness, weakness and headaches.  Endo/Heme/Allergies: Does not bruise/bleed easily.  Psychiatric/Behavioral: Negative for depression. The patient is not nervous/anxious and does not have insomnia.      MEDICAL HISTORY:  Past Medical History:  Diagnosis Date  . Arthritis    LEFT KNEE   . CAD (coronary artery disease)   . COPD (chronic obstructive pulmonary disease) ([place])   . Depression   . Emphysema lung ([place])   . GERD (gastroesophageal reflux disease)   . Hernia, hiatal   . Hyperlipidemia   . Hypertension   . Peripheral vascular disease ([place])   . Pre-diabetes   . Thrombosis    in Lt leg    SURGICAL HISTORY: Past Surgical History:  Procedure Laterality Date  . ABDOMINAL AORTA STENT    . ABDOMINAL HYSTERECTOMY    . aortobifemoral bypass  2005  . CESAREAN SECTION     x 3  . COLONOSCOPY WITH PROPOFOL N/A 03/10/2019   Procedure: COLONOSCOPY WITH PROPOFOL;  Surgeon: Virgel Manifold, MD;  Location: [facility] ENDOSCOPY;  Service: Endoscopy;  Laterality: N/A;  . ESOPHAGOGASTRODUODENOSCOPY (EGD) WITH PROPOFOL N/A 03/10/2019   Procedure: ESOPHAGOGASTRODUODENOSCOPY (EGD) WITH PROPOFOL;  Surgeon: Virgel Manifold, MD;  Location: [facility] ENDOSCOPY;  Service: Endoscopy;  Laterality: N/A;  . EVALUATION UNDER ANESTHESIA WITH HEMORRHOIDECTOMY N/A 08/10/2020   Procedure: EXAM UNDER ANESTHESIA WITH HEMORRHOIDECTOMY;  Surgeon: Benjamine Sprague, DO;  Location: [facility] ORS;  Service: General;  Laterality: N/A;  . femoral stents    . LOWER EXTREMITY ANGIOGRAPHY Left 10/02/2016  Procedure: Lower Extremity Angiography;  Surgeon: Algernon Huxley, MD;  Location: [place] CV LAB;  Service: Cardiovascular;  Laterality: Left;  . LOWER EXTREMITY ANGIOGRAPHY N/A 10/03/2016   Procedure: Lower Extremity Angiography with possible revascularization;  Surgeon: Algernon Huxley, MD;  Location: [place]  CV LAB;  Service: Cardiovascular;   Laterality: N/A;  . PORTACATH PLACEMENT N/A 09/13/2020   Procedure: INSERTION PORT-A-CATH;  Surgeon: Benjamine Sprague, DO;  Location: [facility] ORS;  Service: General;  Laterality: N/A;  . PORTACATH PLACEMENT Right 09/28/2020   Procedure: REVISION/REPOSITIONING OF PORT-A-CATH;  Surgeon: Benjamine Sprague, DO;  Location: [facility] ORS;  Service: General;  Laterality: Right;  Marland Kitchen VASCULAR SURGERY      SOCIAL HISTORY: Social History   Socioeconomic History  . Marital status: Widowed    Spouse name: Not on file  . Number of children: Not on file  . Years of education: Not on file  . Highest education level: Not on file  Occupational History  . Not on file  Tobacco Use  . Smoking status: Current Every Day Smoker    Packs/day: 0.25    Years: 60.00    Pack years: 15.00    Types: Cigarettes  . Smokeless tobacco: Never Used  Vaping Use  . Vaping Use: Never used  Substance and Sexual Activity  . Alcohol use: No  . Drug use: Yes    Types: Marijuana    Comment: 2-3 times a week- smoked last night 08/09/20  . Sexual activity: Never  Other Topics Concern  . Not on file  Social History Narrative   Lives in [place]. Smokes-1 pck in 3 days. No alcohol. Worked in [employer]. Son with her son.    Social Determinants of Health   Financial Resource Strain: Not on file  Food Insecurity: Not on file  Transportation Needs: Not on file  Physical Activity: Not on file  Stress: Not on file  Social Connections: Not on file  Intimate Partner Violence: Not on file    FAMILY HISTORY: Family History  Problem Relation Age of Onset  . Osteoporosis Mother   . Alzheimer's disease Mother   . Colon cancer Daughter 37    ALLERGIES:  is allergic to codeine and metformin and related.  MEDICATIONS:  Current Outpatient Medications  Medication Sig Dispense Refill  . albuterol (VENTOLIN HFA) 108 (90 Base) MCG/ACT inhaler Inhale 2 puffs into the lungs every 6 (six) hours as needed for wheezing or shortness of breath.    Marland Kitchen  apixaban (ELIQUIS) 5 MG TABS tablet Take 1 tablet (5 mg total) by mouth 2 (two) times daily. 90 tablet 4  . aspirin EC 81 MG EC tablet Take 1 tablet (81 mg total) by mouth daily. (Patient taking differently: Take 81 mg by mouth at bedtime.) 30 tablet 5  . cholecalciferol (VITAMIN D) 1000 units tablet Take 1,000 Units by mouth 2 (two) times daily.    . diphenhydrAMINE (BENADRYL) 25 MG tablet Take 50 mg by mouth at bedtime. Sleepaid    . diphenoxylate-atropine (LOMOTIL) 2.5-0.025 MG tablet Take 1 tablet by mouth 4 (four) times daily as needed for diarrhea or loose stools. 60 tablet 0  . ezetimibe-simvastatin (VYTORIN) 10-40 MG tablet Take 1 tablet by mouth at bedtime.    . Fluticasone-Salmeterol (ADVAIR) 250-50 MCG/DOSE AEPB Inhale 1 puff into the lungs daily.    . hydrocortisone 2.5 % cream Apply 1 application topically 2 (two) times daily as needed (itching).    Marland Kitchen ibuprofen (ADVIL) 400 MG tablet Take 1 tablet (  400 mg total) by mouth every 8 (eight) hours as needed for mild pain or moderate pain. 30 tablet 0  . lidocaine (XYLOCAINE) 5 % ointment Apply 1 application topically every evening. 35.44 g 3  . lisinopril (ZESTRIL) 20 MG tablet Take 20 mg by mouth daily.    . magic mouthwash SOLN Take 5 mLs by mouth 3 (three) times daily as needed for mouth pain. 240 mL 0  . nortriptyline (PAMELOR) 10 MG capsule Take 10 mg by mouth daily.    Marland Kitchen omeprazole (PRILOSEC) 20 MG capsule Take 20 mg by mouth daily.    . ondansetron (ZOFRAN) 8 MG tablet One pill every 8 hours as needed for nausea/vomitting. 40 tablet 1  . oxyCODONE (OXY IR/ROXICODONE) 5 MG immediate release tablet Take 1 tablet (5 mg total) by mouth every 4 (four) hours as needed for severe pain. 45 tablet 0  . prochlorperazine (COMPAZINE) 10 MG tablet Take 1 tablet (10 mg total) by mouth every 6 (six) hours as needed for nausea or vomiting. 40 tablet 1  . sertraline (ZOLOFT) 100 MG tablet Take 100 mg by mouth 2 (two) times daily.    Marland Kitchen docusate sodium  (COLACE) 250 MG capsule Take 250 mg by mouth at bedtime. (Patient not taking: Reported on 10/16/2020)    . lidocaine-prilocaine (EMLA) cream Apply 30 -45 mins prior to port access. (Patient not taking: Reported on 10/16/2020) 30 g 0   No current facility-administered medications for this visit.      Marland Kitchen  PHYSICAL EXAMINATION: ECOG PERFORMANCE STATUS: 1 - Symptomatic but completely ambulatory  Vitals:   10/16/20 0940  BP: (!) 110/58  Pulse: 74  Resp: 20  Temp: 97.6 F (36.4 C)   Filed Weights   10/16/20 0940  Weight: 147 lb (66.7 kg)    Physical Exam Constitutional:      Comments: Alone.  Ambulating.   HENT:     Head: Normocephalic and atraumatic.     Mouth/Throat:     Pharynx: No oropharyngeal exudate.  Eyes:     Pupils: Pupils are equal, round, and reactive to light.  Cardiovascular:     Rate and Rhythm: Normal rate and regular rhythm.  Pulmonary:     Effort: No respiratory distress.     Breath sounds: No wheezing.     Comments: Decreased breath sounds at bases.  Abdominal:     General: Bowel sounds are normal. There is no distension.     Palpations: Abdomen is soft. There is no mass.     Tenderness: There is no abdominal tenderness. There is no guarding or rebound.  Musculoskeletal:        General: No tenderness. Normal range of motion.     Cervical back: Normal range of motion and neck supple.  Skin:    General: Skin is warm.  Neurological:     Mental Status: She is alert and oriented to person, place, and time.  Psychiatric:        Mood and Affect: Affect normal.      LABORATORY DATA:  I have reviewed the data as listed Lab Results  Component Value Date   WBC 5.0 10/16/2020   HGB 9.9 (L) 10/16/2020   HCT 29.3 (L) 10/16/2020   MCV 88.3 10/16/2020   PLT 209 10/16/2020   Recent Labs    10/03/20 0950 10/11/20 1022 10/16/20 0937  NA 137 136 137  K 4.2 3.7 4.1  CL 101 103 101  CO2 24 24 24   GLUCOSE 118*  147* 142*  BUN 19 12 12   CREATININE 1.35*  0.98 1.04*  CALCIUM 9.6 8.6* 9.0  GFRNONAA 42* >60 58*  PROT 8.0 6.6 6.8  ALBUMIN 4.1 3.5 3.5  AST 19 20 23   ALT 14 18 15   ALKPHOS 58 53 59  BILITOT 0.8 0.4 0.3    RADIOGRAPHIC STUDIES: I have personally reviewed the radiological images as listed and agreed with the findings in the report. DG C-Arm 1-60 Min-No Report  Result Date: 09/28/2020 Fluoroscopy was utilized by the requesting physician.  No radiographic interpretation.    ASSESSMENT & PLAN:   Cancer of anal canal ([facility]) #Anal cancer squamous cell- [clinical T2N0 or T2N1] PET scan-? Slight uptake in inguinal LN.   # currently s/p  Cycle #1 day-5- 5FU+Mitomycin; day# 17-; continue RT-patient tolerating chemotherapy well except for diarrhea[see below].  # borderline LOW BP/dizzy-[sec to n/v/d]-recommend discontinuation of lisinopril.  Recommend IV fluids 1 L; also recommend dexamethasone 10 mg IV.  Patient declines any scheduled appointments for IV hydration.  #Diarrhea-grade 2 from 5-FU-mitomycin recommend Imodium;   # Anal pain-recommend lidocaine gel for topical application prn-STABLE.   # COPD-on inhalers not on oxygen; STABLE;  Need to monitor closely on mitomycin.  # DISPOSITION:  # today IVFs over 1 hour;  # follow up on May 16th-X- MD: labs- cbc/cmp; 5FU-Mitomycin;  possible IVFs; pump off D-4--Dr.B  All questions were answered. The patient knows to call the clinic with any problems, questions or concerns.    Cammie Sickle, MD 10/16/2020 4:17 PM

## 2020-10-17 ENCOUNTER — Ambulatory Visit
Admission: RE | Admit: 2020-10-17 | Discharge: 2020-10-17 | Disposition: A | Discharge status: Home / Self Care | Payer: Medicare Other | Source: Ambulatory Visit | Attending: Radiation Oncology | Admitting: Radiation Oncology

## 2020-10-17 DIAGNOSIS — C211 Malignant neoplasm of anal canal: Secondary | ICD-10-CM | POA: Diagnosis not present

## 2020-10-18 ENCOUNTER — Ambulatory Visit
Admission: RE | Admit: 2020-10-18 | Discharge: 2020-10-18 | Disposition: A | Discharge status: Home / Self Care | Payer: Medicare Other | Source: Ambulatory Visit | Attending: Radiation Oncology | Admitting: Radiation Oncology

## 2020-10-18 DIAGNOSIS — C211 Malignant neoplasm of anal canal: Secondary | ICD-10-CM | POA: Diagnosis not present

## 2020-10-19 ENCOUNTER — Ambulatory Visit
Admission: RE | Admit: 2020-10-19 | Discharge: 2020-10-19 | Disposition: A | Discharge status: Home / Self Care | Payer: Medicare Other | Source: Ambulatory Visit | Attending: Radiation Oncology | Admitting: Radiation Oncology

## 2020-10-19 DIAGNOSIS — C211 Malignant neoplasm of anal canal: Secondary | ICD-10-CM | POA: Diagnosis not present

## 2020-10-20 ENCOUNTER — Ambulatory Visit
Admission: RE | Admit: 2020-10-20 | Discharge: 2020-10-20 | Disposition: A | Discharge status: Home / Self Care | Payer: Medicare Other | Source: Ambulatory Visit | Attending: Radiation Oncology | Admitting: Radiation Oncology

## 2020-10-20 DIAGNOSIS — C211 Malignant neoplasm of anal canal: Secondary | ICD-10-CM | POA: Diagnosis not present

## 2020-10-23 ENCOUNTER — Ambulatory Visit
Admission: RE | Admit: 2020-10-23 | Discharge: 2020-10-23 | Disposition: A | Discharge status: Home / Self Care | Payer: Medicare Other | Source: Ambulatory Visit | Attending: Radiation Oncology | Admitting: Radiation Oncology

## 2020-10-23 DIAGNOSIS — C211 Malignant neoplasm of anal canal: Secondary | ICD-10-CM | POA: Diagnosis not present

## 2020-10-24 ENCOUNTER — Ambulatory Visit
Admission: RE | Admit: 2020-10-24 | Discharge: 2020-10-24 | Disposition: A | Discharge status: Home / Self Care | Payer: Medicare Other | Source: Ambulatory Visit | Attending: Radiation Oncology | Admitting: Radiation Oncology

## 2020-10-24 DIAGNOSIS — C211 Malignant neoplasm of anal canal: Secondary | ICD-10-CM | POA: Diagnosis not present

## 2020-10-25 ENCOUNTER — Ambulatory Visit
Admission: RE | Admit: 2020-10-25 | Discharge: 2020-10-25 | Disposition: A | Discharge status: Home / Self Care | Payer: Medicare Other | Source: Ambulatory Visit | Attending: Radiation Oncology | Admitting: Radiation Oncology

## 2020-10-25 DIAGNOSIS — C211 Malignant neoplasm of anal canal: Secondary | ICD-10-CM | POA: Diagnosis not present

## 2020-10-26 ENCOUNTER — Ambulatory Visit
Admission: RE | Admit: 2020-10-26 | Discharge: 2020-10-26 | Disposition: A | Discharge status: Home / Self Care | Payer: Medicare Other | Source: Ambulatory Visit | Attending: Radiation Oncology | Admitting: Radiation Oncology

## 2020-10-26 DIAGNOSIS — C211 Malignant neoplasm of anal canal: Secondary | ICD-10-CM | POA: Diagnosis not present

## 2020-10-27 ENCOUNTER — Ambulatory Visit
Admission: RE | Admit: 2020-10-27 | Discharge: 2020-10-27 | Disposition: A | Discharge status: Home / Self Care | Payer: Medicare Other | Source: Ambulatory Visit | Attending: Radiation Oncology | Admitting: Radiation Oncology

## 2020-10-27 ENCOUNTER — Ambulatory Visit: Payer: Medicare Other

## 2020-10-27 DIAGNOSIS — C211 Malignant neoplasm of anal canal: Secondary | ICD-10-CM | POA: Diagnosis not present

## 2020-10-30 ENCOUNTER — Encounter: Payer: Self-pay | Admitting: Oncology

## 2020-10-30 ENCOUNTER — Inpatient Hospital Stay: Payer: Medicare Other

## 2020-10-30 ENCOUNTER — Ambulatory Visit
Admission: RE | Admit: 2020-10-30 | Discharge: 2020-10-30 | Disposition: A | Discharge status: Home / Self Care | Payer: Medicare Other | Source: Ambulatory Visit | Attending: Radiation Oncology | Admitting: Radiation Oncology

## 2020-10-30 ENCOUNTER — Other Ambulatory Visit: Payer: Self-pay

## 2020-10-30 ENCOUNTER — Ambulatory Visit: Payer: Medicare Other

## 2020-10-30 ENCOUNTER — Inpatient Hospital Stay (HOSPITAL_BASED_OUTPATIENT_CLINIC_OR_DEPARTMENT_OTHER): Payer: Medicare Other | Admitting: Oncology

## 2020-10-30 VITALS — BP 124/60 | HR 72 | Resp 16

## 2020-10-30 VITALS — BP 84/52 | HR 70 | Temp 97.6°F | Resp 16 | Wt 143.0 lb

## 2020-10-30 DIAGNOSIS — C211 Malignant neoplasm of anal canal: Secondary | ICD-10-CM

## 2020-10-30 DIAGNOSIS — T451X5A Adverse effect of antineoplastic and immunosuppressive drugs, initial encounter: Secondary | ICD-10-CM

## 2020-10-30 DIAGNOSIS — K521 Toxic gastroenteritis and colitis: Secondary | ICD-10-CM | POA: Diagnosis not present

## 2020-10-30 DIAGNOSIS — R197 Diarrhea, unspecified: Secondary | ICD-10-CM | POA: Insufficient documentation

## 2020-10-30 DIAGNOSIS — R110 Nausea: Secondary | ICD-10-CM | POA: Diagnosis not present

## 2020-10-30 DIAGNOSIS — G893 Neoplasm related pain (acute) (chronic): Secondary | ICD-10-CM

## 2020-10-30 DIAGNOSIS — Z5111 Encounter for antineoplastic chemotherapy: Secondary | ICD-10-CM | POA: Diagnosis not present

## 2020-10-30 LAB — COMPREHENSIVE METABOLIC PANEL
ALT: 18 U/L (ref 0–44)
AST: 22 U/L (ref 15–41)
Albumin: 3.1 g/dL — ABNORMAL LOW (ref 3.5–5.0)
Alkaline Phosphatase: 58 U/L (ref 38–126)
Anion gap: 8 (ref 5–15)
BUN: 20 mg/dL (ref 8–23)
CO2: 24 mmol/L (ref 22–32)
Calcium: 8 mg/dL — ABNORMAL LOW (ref 8.9–10.3)
Chloride: 102 mmol/L (ref 98–111)
Creatinine, Ser: 1 mg/dL (ref 0.44–1.00)
GFR, Estimated: >60 mL/min (ref >60)
Glucose, Bld: 124 mg/dL — ABNORMAL HIGH (ref 70–99)
Potassium: 3.8 mmol/L (ref 3.5–5.1)
Sodium: 134 mmol/L — ABNORMAL LOW (ref 135–145)
Total Bilirubin: 0.3 mg/dL (ref 0.3–1.2)
Total Protein: 6.4 g/dL — ABNORMAL LOW (ref 6.5–8.1)

## 2020-10-30 LAB — CBC WITH DIFFERENTIAL/PLATELET
Abs Immature Granulocytes: 0.03 10*3/uL (ref 0.00–0.07)
Basophils Absolute: 0 10*3/uL (ref 0.0–0.1)
Basophils Relative: 0 %
Eosinophils Absolute: 0.2 10*3/uL (ref 0.0–0.5)
Eosinophils Relative: 4 %
HCT: 30.3 % — ABNORMAL LOW (ref 36.0–46.0)
Hemoglobin: 10.3 g/dL — ABNORMAL LOW (ref 12.0–15.0)
Immature Granulocytes: 1 %
Lymphocytes Relative: 17 %
Lymphs Abs: 0.9 10*3/uL (ref 0.7–4.0)
MCH: 30.2 pg (ref 26.0–34.0)
MCHC: 34 g/dL (ref 30.0–36.0)
MCV: 88.9 fL (ref 80.0–100.0)
Monocytes Absolute: 0.4 10*3/uL (ref 0.1–1.0)
Monocytes Relative: 6 %
Neutro Abs: 4.2 10*3/uL (ref 1.7–7.7)
Neutrophils Relative %: 72 %
Platelets: 192 10*3/uL (ref 150–400)
RBC: 3.41 MIL/uL — ABNORMAL LOW (ref 3.87–5.11)
RDW: 18.6 % — ABNORMAL HIGH (ref 11.5–15.5)
WBC: 5.7 10*3/uL (ref 4.0–10.5)
nRBC: 0 % (ref 0.0–0.2)

## 2020-10-30 MED ORDER — MITOMYCIN CHEMO IV INJECTION 20 MG
10.0000 mg/m2 | Freq: Once | INTRAVENOUS | Status: AC
  Administered 2020-10-30: 17 mg via INTRAVENOUS
  Filled 2020-10-30: qty 34

## 2020-10-30 MED ORDER — HEPARIN SOD (PORK) LOCK FLUSH 100 UNIT/ML IV SOLN
INTRAVENOUS | Status: AC
  Filled 2020-10-30: qty 5

## 2020-10-30 MED ORDER — SODIUM CHLORIDE 0.9 % IV SOLN
Freq: Once | INTRAVENOUS | Status: AC
  Filled 2020-10-30: qty 250

## 2020-10-30 MED ORDER — DEXAMETHASONE SODIUM PHOSPHATE 10 MG/ML IJ SOLN
10.0000 mg | Freq: Once | INTRAMUSCULAR | Status: AC
  Administered 2020-10-30: 10 mg via INTRAVENOUS
  Filled 2020-10-30: qty 1

## 2020-10-30 MED ORDER — PROCHLORPERAZINE MALEATE 10 MG PO TABS
10.0000 mg | ORAL_TABLET | Freq: Once | ORAL | Status: AC
Start: 2020-10-30 — End: 2020-10-30
  Administered 2020-10-30: 10 mg via ORAL
  Filled 2020-10-30: qty 1

## 2020-10-30 MED ORDER — SODIUM CHLORIDE 0.9 % IV SOLN
1000.0000 mg/m2/d | INTRAVENOUS | Status: DC
  Administered 2020-10-30: 6850 mg via INTRAVENOUS
  Filled 2020-10-30: qty 137

## 2020-10-30 MED ORDER — SODIUM CHLORIDE 0.9 % IV SOLN
Freq: Once | INTRAVENOUS | Status: AC
Start: 2020-10-30 — End: 2020-10-30
  Filled 2020-10-30: qty 250

## 2020-10-30 NOTE — Progress Notes (Signed)
Patient reports not having an appetite and drinks up to 3 meal replacement skaes a day.  Does have occasional diarrhea with last episodes yesterday that was relieved with Lomotil.  Also takes nausea med to help with the occasional nausea.

## 2020-10-30 NOTE — Patient Instructions (Signed)
[facility]  Discharge Instructions: Thank you for choosing [facility] to provide your oncology and hematology care.  If you have a lab appointment with the [facility], please go directly to the [facility] and check in at the registration area.  Wear comfortable clothing and clothing appropriate for easy access to any Portacath or PICC line.   We strive to give you quality time with your provider. You may need to reschedule your appointment if you arrive late (15 or more minutes).  Arriving late affects you and other patients whose appointments are after yours.  Also, if you miss three or more appointments without notifying the office, you may be dismissed from the clinic at the provider's discretion.      For prescription refill requests, have your pharmacy contact our office and allow 72 hours for refills to be completed.    Today you received the following chemotherapy and/or immunotherapy agents - mitomycin, 5-FU      To help prevent nausea and vomiting after your treatment, we encourage you to take your nausea medication as directed.  BELOW ARE SYMPTOMS THAT SHOULD BE REPORTED IMMEDIATELY: . *FEVER GREATER THAN 100.4 F (38 C) OR HIGHER . *CHILLS OR SWEATING . *NAUSEA AND VOMITING THAT IS NOT CONTROLLED WITH YOUR NAUSEA MEDICATION . *UNUSUAL SHORTNESS OF BREATH . *UNUSUAL BRUISING OR BLEEDING . *URINARY PROBLEMS (pain or burning when urinating, or frequent urination) . *BOWEL PROBLEMS (unusual diarrhea, constipation, pain near the anus) . TENDERNESS IN MOUTH AND THROAT WITH OR WITHOUT PRESENCE OF ULCERS (sore throat, sores in mouth, or a toothache) . UNUSUAL RASH, SWELLING OR PAIN  . UNUSUAL VAGINAL DISCHARGE OR ITCHING   Items with * indicate a potential emergency and should be followed up as soon as possible or go to the Emergency Department if any problems should occur.  Please show the CHEMOTHERAPY ALERT CARD or  IMMUNOTHERAPY ALERT CARD at check-in to the Emergency Department and triage nurse.  Should you have questions after your visit or need to cancel or reschedule your appointment, please contact [facility]  [phone] and follow the prompts.  Office hours are 8:00 a.m. to 4:30 p.m. Monday - Friday. Please note that voicemails left after 4:00 p.m. may not be returned until the following business day.  We are closed weekends and major holidays. You have access to a nurse at all times for urgent questions. Please call the main number to the clinic [phone] and follow the prompts.  For any non-urgent questions, you may also contact your provider using MyChart. We now offer e-Visits for anyone 47 and older to request care online for non-urgent symptoms. For details visit [email or web address].   Also download the MyChart app! Go to the app store, search "MyChart", open the app, select [facility], and log in with your MyChart username and password.  Due to Covid, a mask is required upon entering the hospital/clinic. If you do not have a mask, one will be given to you upon arrival. For doctor visits, patients may have 1 support person aged 23 or older with them. For treatment visits, patients cannot have anyone with them due to current Covid guidelines and our immunocompromised population.   Mitomycin injection What is this medicine? MITOMYCIN (mye toe MYE sin) is a chemotherapy drug. This medicine is used to treat cancer of the stomach and pancreas. This medicine may be used for other purposes; ask your health care provider or pharmacist if  you have questions. COMMON BRAND NAME(S): Mutamycin What should I tell my health care provider before I take this medicine? They need to know if you have any of these conditions:  bleeding disorders  infection (especially a viral infection such as chickenpox, cold sores, or herpes)  low blood counts, like white cells,  platelets, or red blood cells  kidney disease  an unusual or allergic reaction to mitomycin, other medicines, foods, dyes, or preservatives  pregnant or trying to get pregnant  breast-feeding How should I use this medicine? This drug is given as an injection or infusion into a vein. It is administered in a hospital or clinic by a specially trained health care professional. Talk to your pediatrician regarding the use of this medicine in children. Special care may be needed. Overdosage: If you think you have taken too much of this medicine contact a poison control center or emergency room at once. NOTE: This medicine is only for you. Do not share this medicine with others. What if I miss a dose? It is important not to miss your dose. Call your doctor or health care professional if you are unable to keep an appointment. What may interact with this medicine? Interactions are not expected. This list may not describe all possible interactions. Give your health care provider a list of all the medicines, herbs, non-prescription drugs, or dietary supplements you use. Also tell them if you smoke, drink alcohol, or use illegal drugs. Some items may interact with your medicine. What should I watch for while using this medicine? Your condition will be monitored carefully while you are receiving this medicine. You will need important blood work done while you are taking this medicine. This drug may make you feel generally unwell. This is not uncommon, as chemotherapy can affect healthy cells as well as cancer cells. Report any side effects. Continue your course of treatment even though you feel ill unless your doctor tells you to stop. Call your doctor or health care professional for advice if you get a fever, chills or sore throat, or other symptoms of a cold or flu. Do not treat yourself. This drug decreases your body's ability to fight infections. Try to avoid being around people who are sick. This  medicine may increase your risk to bruise or bleed. Call your doctor or health care professional if you notice any unusual bleeding. Be careful brushing and flossing your teeth or using a toothpick because you may get an infection or bleed more easily. If you have any dental work done, tell your dentist you are receiving this medicine. Avoid taking products that contain aspirin, acetaminophen, ibuprofen, naproxen, or ketoprofen unless instructed by your doctor. These medicines may hide a fever. Do not become pregnant while taking this medicine. Women should inform their doctor if they wish to become pregnant or think they might be pregnant. There is a potential for serious side effects to an unborn child. Talk to your health care professional or pharmacist for more information. Do not breast-feed an infant while taking this medicine. What side effects may I notice from receiving this medicine? Side effects that you should report to your doctor or health care professional as soon as possible:  allergic reactions like skin rash, itching or hives, swelling of the face, lips, or tongue  breathing problems  pain, redness, or irritation at site where injected  signs and symptoms of bleeding such as bloody or black, tarry stools; red or dark brown urine; spitting up blood or brown  material that looks like coffee grounds; red spots on the skin; unusual bruising or bleeding from the eyes, gums, or nose  signs and symptoms of infection like fever; chills; cough; sore throat; pain or trouble passing urine  signs and symptoms of kidney injury like trouble passing urine or change in the amount of urine  signs and symptoms of low red blood cells or anemia such as unusually weak or tired; feeling faint or lightheaded; falls; breathing problems Side effects that usually do not require medical attention (report to your doctor or health care professional if they continue or are bothersome):  green to blue color  of urine  hair loss  loss of appetite  mouth sores  nausea, vomiting This list may not describe all possible side effects. Call your doctor for medical advice about side effects. You may report side effects to FDA at 1-800-FDA-1088. Where should I keep my medicine? This drug is given in a hospital or clinic and will not be stored at home. NOTE: This sheet is a summary. It may not cover all possible information. If you have questions about this medicine, talk to your doctor, pharmacist, or health care provider.  2021 Elsevier/Gold Standard (2019-01-19 16:33:50)   Fluorouracil, 5-FU injection What is this medicine? FLUOROURACIL, 5-FU (flure oh YOOR a sil) is a chemotherapy drug. It slows the growth of cancer cells. This medicine is used to treat many types of cancer like breast cancer, colon or rectal cancer, pancreatic cancer, and stomach cancer. This medicine may be used for other purposes; ask your health care provider or pharmacist if you have questions. COMMON BRAND NAME(S): Adrucil What should I tell my health care provider before I take this medicine? They need to know if you have any of these conditions:  blood disorders  dihydropyrimidine dehydrogenase (DPD) deficiency  infection (especially a virus infection such as chickenpox, cold sores, or herpes)  kidney disease  liver disease  malnourished, poor nutrition  recent or ongoing radiation therapy  an unusual or allergic reaction to fluorouracil, other chemotherapy, other medicines, foods, dyes, or preservatives  pregnant or trying to get pregnant  breast-feeding How should I use this medicine? This drug is given as an infusion or injection into a vein. It is administered in a hospital or clinic by a specially trained health care professional. Talk to your pediatrician regarding the use of this medicine in children. Special care may be needed. Overdosage: If you think you have taken too much of this medicine  contact a poison control center or emergency room at once. NOTE: This medicine is only for you. Do not share this medicine with others. What if I miss a dose? It is important not to miss your dose. Call your doctor or health care professional if you are unable to keep an appointment. What may interact with this medicine? Do not take this medicine with any of the following medications:  live virus vaccines This medicine may also interact with the following medications:  medicines that treat or prevent blood clots like warfarin, enoxaparin, and dalteparin This list may not describe all possible interactions. Give your health care provider a list of all the medicines, herbs, non-prescription drugs, or dietary supplements you use. Also tell them if you smoke, drink alcohol, or use illegal drugs. Some items may interact with your medicine. What should I watch for while using this medicine? Visit your doctor for checks on your progress. This drug may make you feel generally unwell. This is not uncommon,  as chemotherapy can affect healthy cells as well as cancer cells. Report any side effects. Continue your course of treatment even though you feel ill unless your doctor tells you to stop. In some cases, you may be given additional medicines to help with side effects. Follow all directions for their use. Call your doctor or health care professional for advice if you get a fever, chills or sore throat, or other symptoms of a cold or flu. Do not treat yourself. This drug decreases your body's ability to fight infections. Try to avoid being around people who are sick. This medicine may increase your risk to bruise or bleed. Call your doctor or health care professional if you notice any unusual bleeding. Be careful brushing and flossing your teeth or using a toothpick because you may get an infection or bleed more easily. If you have any dental work done, tell your dentist you are receiving this  medicine. Avoid taking products that contain aspirin, acetaminophen, ibuprofen, naproxen, or ketoprofen unless instructed by your doctor. These medicines may hide a fever. Do not become pregnant while taking this medicine. Women should inform their doctor if they wish to become pregnant or think they might be pregnant. There is a potential for serious side effects to an unborn child. Talk to your health care professional or pharmacist for more information. Do not breast-feed an infant while taking this medicine. Men should inform their doctor if they wish to father a child. This medicine may lower sperm counts. Do not treat diarrhea with over the counter products. Contact your doctor if you have diarrhea that lasts more than 2 days or if it is severe and watery. This medicine can make you more sensitive to the sun. Keep out of the sun. If you cannot avoid being in the sun, wear protective clothing and use sunscreen. Do not use sun lamps or tanning beds/booths. What side effects may I notice from receiving this medicine? Side effects that you should report to your doctor or health care professional as soon as possible:  allergic reactions like skin rash, itching or hives, swelling of the face, lips, or tongue  low blood counts - this medicine may decrease the number of white blood cells, red blood cells and platelets. You may be at increased risk for infections and bleeding.  signs of infection - fever or chills, cough, sore throat, pain or difficulty passing urine  signs of decreased platelets or bleeding - bruising, pinpoint red spots on the skin, black, tarry stools, blood in the urine  signs of decreased red blood cells - unusually weak or tired, fainting spells, lightheadedness  breathing problems  changes in vision  chest pain  mouth sores  nausea and vomiting  pain, swelling, redness at site where injected  pain, tingling, numbness in the hands or feet  redness, swelling, or  sores on hands or feet  stomach pain  unusual bleeding Side effects that usually do not require medical attention (report to your doctor or health care professional if they continue or are bothersome):  changes in finger or toe nails  diarrhea  dry or itchy skin  hair loss  headache  loss of appetite  sensitivity of eyes to the light  stomach upset  unusually teary eyes This list may not describe all possible side effects. Call your doctor for medical advice about side effects. You may report side effects to FDA at 1-800-FDA-1088. Where should I keep my medicine? This drug is given in a hospital or  clinic and will not be stored at home. NOTE: This sheet is a summary. It may not cover all possible information. If you have questions about this medicine, talk to your doctor, pharmacist, or health care provider.  2021 Elsevier/Gold Standard (2019-05-04 15:00:03)

## 2020-10-30 NOTE — Progress Notes (Signed)
[place] NOTE  Patient Care Team: Letta Median, MD as PCP - General (Family Medicine) Clent Jacks, RN as Oncology Nurse Navigator Benjamine Sprague, DO as Consulting Physician (Surgery) Cammie Sickle, MD as Consulting Physician (Internal Medicine)  CHIEF COMPLAINTS/PURPOSE OF CONSULTATION: anal cancer  #  Oncology History Overview Note  # [facility] 2022- ANAL CA- EXCISIONAL BIOPSY:  - INVASIVE MODERATELY DIFFERENTIATED SQUAMOUS CELL CARCINOMA, PRESENT AT DEEP AND LATERAL TISSUE EDGES. 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  female history of COPD; anal cancer [stafe II vs III] currently on chemoradiation is here for follow-up. Patient has been concurrent chemoradiation.  Overall she tolerates well with mild difficulties  Patient follows up with Dr.Brahmanday who is off today, I am covering him to see this patient. Medical records review was performed by me.  Appetite has decreased and she has lost 4 pounds.  She drinks nutrition supplementation 3 times a day. Occasional diarrhea with last episode yesterday that was relieved with taking Lomotil. Mild nausea for which antiemetics helps. Patient denies lightheadedness.  Blood pressure was initially low in the clinic 84/52.  Review of Systems  Constitutional: Positive for malaise/fatigue and weight loss. Negative for chills, diaphoresis and fever.  HENT: Negative for nosebleeds and sore throat.   Eyes: Negative for double vision.  Respiratory: Negative for cough, hemoptysis, sputum production,  shortness of breath and wheezing.   Cardiovascular: Negative for chest pain, palpitations, orthopnea and leg swelling.  Gastrointestinal: Positive for diarrhea and nausea. Negative for abdominal pain, constipation, heartburn and melena.  Genitourinary: Negative for dysuria, frequency and urgency.  Musculoskeletal: Positive for back pain and joint pain.  Skin: Negative.  Negative for itching and rash.  Neurological: Negative for dizziness, tingling, focal weakness, weakness and headaches.  Endo/Heme/Allergies: Does not bruise/bleed easily.  Psychiatric/Behavioral: Negative for depression. The patient is not nervous/anxious and does not have insomnia.      MEDICAL HISTORY:  Past Medical History:  Diagnosis Date  . Arthritis    LEFT KNEE   . CAD (coronary artery disease)   . COPD (chronic obstructive pulmonary disease) ([place])   . Depression   . Emphysema lung ([place])   . GERD (gastroesophageal reflux disease)   . Hernia, hiatal   . Hyperlipidemia   . Hypertension   . Peripheral vascular disease ([place])   . Pre-diabetes   . Thrombosis    in Lt leg    SURGICAL HISTORY: Past Surgical History:  Procedure Laterality Date  . ABDOMINAL AORTA STENT    . ABDOMINAL HYSTERECTOMY    . aortobifemoral bypass  2005  . CESAREAN SECTION     x 3  . COLONOSCOPY WITH PROPOFOL N/A 03/10/2019   Procedure: COLONOSCOPY WITH PROPOFOL;  Surgeon: Virgel Manifold, MD;  Location: [facility] ENDOSCOPY;  Service: Endoscopy;  Laterality: N/A;  . ESOPHAGOGASTRODUODENOSCOPY (EGD) WITH PROPOFOL N/A 03/10/2019   Procedure: ESOPHAGOGASTRODUODENOSCOPY (EGD) WITH PROPOFOL;  Surgeon: Virgel Manifold, MD;  Location: [facility] ENDOSCOPY;  Service: Endoscopy;  Laterality: N/A;  . EVALUATION UNDER ANESTHESIA WITH HEMORRHOIDECTOMY N/A 08/10/2020   Procedure: EXAM UNDER ANESTHESIA WITH HEMORRHOIDECTOMY;  Surgeon: Benjamine Sprague, DO;  Location: [facility] ORS;  Service: General;  Laterality: N/A;  . femoral stents    . LOWER EXTREMITY  ANGIOGRAPHY Left 10/02/2016   Procedure: Lower Extremity Angiography;  Surgeon: Algernon Huxley, MD;  Location: [place];  Service: Cardiovascular;  Laterality: Left;  . LOWER EXTREMITY ANGIOGRAPHY N/A 10/03/2016   Procedure: Lower Extremity Angiography with possible revascularization;  Surgeon: Algernon Huxley, MD;  Location: [place] [facility];  Service: Cardiovascular;  Laterality: N/A;  . PORTACATH PLACEMENT N/A 09/13/2020   Procedure: INSERTION PORT-A-CATH;  Surgeon: Benjamine Sprague, DO;  Location: [facility] ORS;  Service: General;  Laterality: N/A;  . PORTACATH PLACEMENT Right 09/28/2020   Procedure: REVISION/REPOSITIONING OF PORT-A-CATH;  Surgeon: Benjamine Sprague, DO;  Location: [facility] ORS;  Service: General;  Laterality: Right;  Marland Kitchen VASCULAR SURGERY      SOCIAL HISTORY: Social History   Socioeconomic History  . Marital status: Widowed    Spouse name: Not on file  . Number of children: Not on file  . Years of education: Not on file  . Highest education level: Not on file  Occupational History  . Not on file  Tobacco Use  . Smoking status: Current Every Day Smoker    Packs/day: 0.25    Years: 60.00    Pack years: 15.00    Types: Cigarettes  . Smokeless tobacco: Never Used  Vaping Use  . Vaping Use: Never used  Substance and Sexual Activity  . Alcohol use: No  . Drug use: Yes    Types: Marijuana    Comment: 2-3 times a week- smoked last night 08/09/20  . Sexual activity: Never  Other Topics Concern  . Not on file  Social History Narrative   Lives in [place]. Smokes-1 pck in 3 days. No alcohol. Worked in [employer]. Son with her son.    Social Determinants of Health   Financial Resource Strain: Not on file  Food Insecurity: Not on file  Transportation Needs: Not on file  Physical Activity: Not on file  Stress: Not on file  Social Connections: Not on file  Intimate Partner Violence: Not on file    FAMILY HISTORY: Family History  Problem Relation Age of Onset  .  Osteoporosis Mother   . Alzheimer's disease Mother   . Colon cancer Daughter 34    ALLERGIES:  is allergic to codeine and metformin and related.  MEDICATIONS:  Current Outpatient Medications  Medication Sig Dispense Refill  . albuterol (VENTOLIN HFA) 108 (90 Base) MCG/ACT inhaler Inhale 2 puffs into the lungs every 6 (six) hours as needed for wheezing or shortness of breath.    Marland Kitchen apixaban (ELIQUIS) 5 MG TABS tablet Take 1 tablet (5 mg total) by mouth 2 (two) times daily. 90 tablet 4  . aspirin EC 81 MG EC tablet Take 1 tablet (81 mg total) by mouth daily. (Patient taking differently: Take 81 mg by mouth at bedtime.) 30 tablet 5  . cholecalciferol (VITAMIN D) 1000 units tablet Take 1,000 Units by mouth 2 (two) times daily.    . diphenhydrAMINE (BENADRYL) 25 MG tablet Take 50 mg by mouth at bedtime. Sleepaid    . diphenoxylate-atropine (LOMOTIL) 2.5-0.025 MG tablet Take 1 tablet by mouth 4 (four) times daily as needed for diarrhea or loose stools. 60 tablet 0  . ezetimibe-simvastatin (VYTORIN) 10-40  MG tablet Take 1 tablet by mouth at bedtime.    . Fluticasone-Salmeterol (ADVAIR) 250-50 MCG/DOSE AEPB Inhale 1 puff into the lungs daily.    . hydrocortisone 2.5 % cream Apply 1 application topically 2 (two) times daily as needed (itching).    Marland Kitchen ibuprofen (ADVIL) 400 MG tablet Take 1 tablet (400 mg total) by mouth every 8 (eight) hours as needed for mild pain or moderate pain. 30 tablet 0  . lisinopril (ZESTRIL) 20 MG tablet Take 20 mg by mouth daily.    . magic mouthwash SOLN Take 5 mLs by mouth 3 (three) times daily as needed for mouth pain. 240 mL 0  . nortriptyline (PAMELOR) 10 MG capsule Take 10 mg by mouth daily.    Marland Kitchen omeprazole (PRILOSEC) 20 MG capsule Take 20 mg by mouth daily.    . ondansetron (ZOFRAN) 8 MG tablet One pill every 8 hours as needed for nausea/vomitting. 40 tablet 1  . oxyCODONE (OXY IR/ROXICODONE) 5 MG immediate release tablet Take 1 tablet (5 mg total) by mouth every 4  (four) hours as needed for severe pain. 45 tablet 0  . sertraline (ZOLOFT) 100 MG tablet Take 100 mg by mouth 2 (two) times daily.    Marland Kitchen docusate sodium (COLACE) 250 MG capsule Take 250 mg by mouth at bedtime. (Patient not taking: No sig reported)    . lidocaine (XYLOCAINE) 5 % ointment Apply 1 application topically every evening. 35.44 g 3  . lidocaine-prilocaine (EMLA) cream Apply 30 -45 mins prior to port access. (Patient not taking: No sig reported) 30 g 0  . prochlorperazine (COMPAZINE) 10 MG tablet Take 1 tablet (10 mg total) by mouth every 6 (six) hours as needed for nausea or vomiting. 40 tablet 1   No current facility-administered medications for this visit.      Marland Kitchen  PHYSICAL EXAMINATION: ECOG PERFORMANCE STATUS: 1 - Symptomatic but completely ambulatory  Vitals:   10/30/20 1037  BP: (!) 84/52  Pulse: 70  Resp: 16  Temp: 97.6 F (36.4 C)   Filed Weights   10/30/20 1037  Weight: 143 lb (64.9 kg)    Physical Exam Constitutional:      Comments: Alone.  Patient walks independently  HENT:     Head: Normocephalic and atraumatic.     Mouth/Throat:     Pharynx: No oropharyngeal exudate.  Eyes:     Pupils: Pupils are equal, round, and reactive to light.  Cardiovascular:     Rate and Rhythm: Normal rate and regular rhythm.  Pulmonary:     Effort: No respiratory distress.     Breath sounds: No wheezing.     Comments: Decreased breath sounds at bases.  Abdominal:     General: Bowel sounds are normal. There is no distension.     Palpations: Abdomen is soft. There is no mass.     Tenderness: There is no abdominal tenderness. There is no guarding or rebound.  Musculoskeletal:        General: No tenderness. Normal range of motion.     Cervical back: Normal range of motion and neck supple.  Skin:    General: Skin is warm.  Neurological:     Mental Status: She is alert and oriented to person, place, and time.  Psychiatric:        Mood and Affect: Affect normal.       LABORATORY DATA:  I have reviewed the data as listed Lab Results  Component Value Date   WBC 5.7 10/30/2020  HGB 10.3 (L) 10/30/2020   HCT 30.3 (L) 10/30/2020   MCV 88.9 10/30/2020   PLT 192 10/30/2020   Recent Labs    10/11/20 1022 10/16/20 0937 10/30/20 1230  NA 136 137 134*  K 3.7 4.1 3.8  CL 103 101 102  CO2 24 24 24   GLUCOSE 147* 142* 124*  BUN 12 12 20   CREATININE 0.98 1.04* 1.00  CALCIUM 8.6* 9.0 8.0*  GFRNONAA >60 58* >60  PROT 6.6 6.8 6.4*  ALBUMIN 3.5 3.5 3.1*  AST 20 23 22   ALT 18 15 18   ALKPHOS 53 59 58  BILITOT 0.4 0.3 0.3    RADIOGRAPHIC STUDIES: I have personally reviewed the radiological images as listed and agreed with the findings in the report. No results found.  ASSESSMENT & PLAN:  1. Cancer of anal canal ([place])   2. Neoplasm related pain   3. Chemotherapy induced diarrhea   4. Chemotherapy-induced nausea    #Anal carcinoma, [clinical T2N0 or T2N1] PET scan-? Slight uptake in inguinal LN.  Currently on concurrent chemotherapy 5-FU/mitomycin with radiation. Overall she tolerates well. Labs reviewed and discussed with patient. Proceed with day 28 5-FU/mitomycin.  #Hypotension, patient was advised by Dr. Rogue Bussing during last visit to stop lisinopril.  Patient appears to be still on lisinopril and I discussed with her and recommend patient to stop.  IV normal saline x1 today. We will also give patient 10 mg of dexamethasone x1. She will receive IV normal saline x1 on day 5 when she discontinue port. #Chemotherapy-induced nausea, continue antiemetics.  Dexamethasone today. #Chemotherapy-induced diarrhea, symptoms are controlled with Lomotil.  Follow-up to be determined.  Patient needs reevaluation DRE.  I deferred to Dr. Rogue Bussing  to decide follow-up date. All questions were answered. The patient knows to call the clinic with any problems, questions or concerns.    Earlie Server, MD 10/30/2020 9:27 PM

## 2020-10-31 ENCOUNTER — Ambulatory Visit
Admission: RE | Admit: 2020-10-31 | Discharge: 2020-10-31 | Disposition: A | Discharge status: Home / Self Care | Payer: Medicare Other | Source: Ambulatory Visit | Attending: Radiation Oncology | Admitting: Radiation Oncology

## 2020-10-31 ENCOUNTER — Ambulatory Visit: Payer: Medicare Other

## 2020-10-31 DIAGNOSIS — C211 Malignant neoplasm of anal canal: Secondary | ICD-10-CM | POA: Diagnosis not present

## 2020-11-01 ENCOUNTER — Ambulatory Visit
Admission: RE | Admit: 2020-11-01 | Discharge: 2020-11-01 | Disposition: A | Discharge status: Home / Self Care | Payer: Medicare Other | Source: Ambulatory Visit | Attending: Radiation Oncology | Admitting: Radiation Oncology

## 2020-11-01 ENCOUNTER — Ambulatory Visit: Payer: Medicare Other

## 2020-11-01 DIAGNOSIS — C211 Malignant neoplasm of anal canal: Secondary | ICD-10-CM | POA: Diagnosis not present

## 2020-11-02 ENCOUNTER — Ambulatory Visit: Payer: Medicare Other

## 2020-11-03 ENCOUNTER — Ambulatory Visit
Admission: RE | Admit: 2020-11-03 | Discharge: 2020-11-03 | Disposition: A | Discharge status: Home / Self Care | Payer: Medicare Other | Source: Ambulatory Visit | Attending: Radiation Oncology | Admitting: Radiation Oncology

## 2020-11-03 ENCOUNTER — Other Ambulatory Visit: Payer: Self-pay

## 2020-11-03 ENCOUNTER — Inpatient Hospital Stay: Payer: Medicare Other

## 2020-11-03 VITALS — BP 90/60 | HR 72 | Temp 99.0°F | Resp 16

## 2020-11-03 DIAGNOSIS — C211 Malignant neoplasm of anal canal: Secondary | ICD-10-CM

## 2020-11-03 DIAGNOSIS — Z5111 Encounter for antineoplastic chemotherapy: Secondary | ICD-10-CM | POA: Diagnosis not present

## 2020-11-03 MED ORDER — SODIUM CHLORIDE 0.9 % IV SOLN
Freq: Once | INTRAVENOUS | Status: AC
  Filled 2020-11-03: qty 250

## 2020-11-03 MED ORDER — SODIUM CHLORIDE 0.9% FLUSH
10.0000 mL | INTRAVENOUS | Status: DC | PRN
  Administered 2020-11-03: 10 mL
  Filled 2020-11-03: qty 10

## 2020-11-03 MED ORDER — HEPARIN SOD (PORK) LOCK FLUSH 100 UNIT/ML IV SOLN
500.0000 [IU] | Freq: Once | INTRAVENOUS | Status: AC | PRN
  Administered 2020-11-03: 500 [IU]
  Filled 2020-11-03: qty 5

## 2020-11-03 NOTE — Progress Notes (Signed)
Nutrition Assessment   Reason for Assessment:  RD screen, wt loss   ASSESSMENT:  70 year old female with anal cancer.  Past medical history of smoker, PVD, COPD, GERD, HTN, pre-diabetes, HLD.  Patient receiving radiation and chemotherapy.    Met with patient during infusion today.  Patient reports that her appetite is decreased.  Had issues with nausea that decreased intake.  Has started taking nausea medications which help.  Patient reports issues with diarrhea but it has improved.  Reports food grows in her mouth at times and feels queasy. Says that she mostly has been eating crackers and soup recently. Drink equate shakes 3 times per day.      Medications: lomotil, prilosec, zofran, compazine, Vit D and b 12   Labs: reviewed   Anthropometrics:   Height: 62.5 inches Weight: 143 lb 5/16 3/14 153 lb 11.2 oz 4/14 151 lb 14.4 oz BMI: 25  6% weight loss in the last 2 months, concerning   Estimated Energy Needs  Kcals: 1625-1950 Protein: 78-98 g (1.2-1.5 g/kg) Fluid: 1.6 L   NUTRITION DIAGNOSIS: Inadequate oral intake related to cancer and cancer related treatment side effects as evidenced by 6% weight loss in the last 2 months and decreased appetite   INTERVENTION:  Discussed importance of nutrition during treatment Encouraged patient to take nausea medication to treat symptoms so that she can eat. Provided handout on foods to choose with nausea and vomiting and diarrhea.  Encouraged high calorie (350 + calorie) shake   MONITORING, EVALUATION, GOAL: weight loss, intake   Next Visit: ~4-6 weeks  Adolphe Fortunato B. Zenia Resides, [place], [place] Registered Dietitian [phone] (mobile)

## 2020-11-06 ENCOUNTER — Ambulatory Visit
Admission: RE | Admit: 2020-11-06 | Discharge: 2020-11-06 | Disposition: A | Discharge status: Home / Self Care | Payer: Medicare Other | Source: Ambulatory Visit | Attending: Radiation Oncology | Admitting: Radiation Oncology

## 2020-11-06 DIAGNOSIS — C211 Malignant neoplasm of anal canal: Secondary | ICD-10-CM | POA: Diagnosis not present

## 2020-11-07 ENCOUNTER — Ambulatory Visit
Admission: RE | Admit: 2020-11-07 | Discharge: 2020-11-07 | Disposition: A | Discharge status: Home / Self Care | Payer: Medicare Other | Source: Ambulatory Visit | Attending: Radiation Oncology | Admitting: Radiation Oncology

## 2020-11-07 ENCOUNTER — Ambulatory Visit: Payer: Medicare Other

## 2020-11-07 DIAGNOSIS — C211 Malignant neoplasm of anal canal: Secondary | ICD-10-CM | POA: Diagnosis not present

## 2020-11-08 ENCOUNTER — Ambulatory Visit
Admission: RE | Admit: 2020-11-08 | Discharge: 2020-11-08 | Disposition: A | Discharge status: Home / Self Care | Payer: Medicare Other | Source: Ambulatory Visit | Attending: Radiation Oncology | Admitting: Radiation Oncology

## 2020-11-08 DIAGNOSIS — C211 Malignant neoplasm of anal canal: Secondary | ICD-10-CM | POA: Diagnosis not present

## 2020-11-22 ENCOUNTER — Telehealth: Payer: Self-pay

## 2020-11-22 NOTE — Telephone Encounter (Signed)
Patient was not contacted for annual lung cancer screening CT scan due to having PET scan done March 2022.

## 2020-11-24 ENCOUNTER — Telehealth: Payer: Self-pay

## 2020-11-24 NOTE — Telephone Encounter (Signed)
Nutrition  Called patient for nutrition follow-up.  Only number in chart was son's phone number. Left message with call back number.  Alayne Estrella B. Zenia Resides, [place], [place] Registered Dietitian [phone] (mobile)

## 2020-12-01 ENCOUNTER — Other Ambulatory Visit: Payer: Self-pay | Admitting: Internal Medicine

## 2020-12-01 DIAGNOSIS — C211 Malignant neoplasm of anal canal: Secondary | ICD-10-CM

## 2020-12-01 NOTE — Addendum Note (Signed)
Addended by: Gloris Ham on: 12/01/2020 09:09 AM   Modules accepted: Orders

## 2020-12-01 NOTE — Progress Notes (Signed)
Last chemo 5/16; last RT- 5/25.   C- please schedule follow up in second week of July- MD: labs-/port flush/-cbc/cmp-Thanks GB

## 2020-12-21 ENCOUNTER — Encounter: Payer: Self-pay | Admitting: Radiation Oncology

## 2020-12-21 ENCOUNTER — Ambulatory Visit
Admission: RE | Admit: 2020-12-21 | Discharge: 2020-12-21 | Disposition: A | Discharge status: Home / Self Care | Payer: Medicare Other | Source: Ambulatory Visit | Attending: Radiation Oncology | Admitting: Radiation Oncology

## 2020-12-21 VITALS — BP 111/84 | HR 83 | Temp 97.1°F | Resp 16 | Wt 139.3 lb

## 2020-12-21 DIAGNOSIS — C211 Malignant neoplasm of anal canal: Secondary | ICD-10-CM

## 2020-12-21 DIAGNOSIS — C210 Malignant neoplasm of anus, unspecified: Secondary | ICD-10-CM | POA: Insufficient documentation

## 2020-12-21 DIAGNOSIS — Z9221 Personal history of antineoplastic chemotherapy: Secondary | ICD-10-CM | POA: Diagnosis not present

## 2020-12-21 DIAGNOSIS — Z923 Personal history of irradiation: Secondary | ICD-10-CM | POA: Diagnosis not present

## 2020-12-21 NOTE — Progress Notes (Signed)
Radiation Oncology Follow up Note  Name: Mariah Rogers   Date:   12/21/2020 MRN:  [id number] DOB: [birth year]    This 70 y.o. female presents to the clinic today for 1 month follow-up status post concurrent chemoradiation therapy for squamous cell carcinoma the anus.  REFERRING PROVIDER: Letta Median, MD  HPI: Patient is a 70 year old female now at 1 month having completed concurrent chemoradiation therapy for squamous cell carcinoma the anus.  Seen today in routine follow-up she is doing well she states her perianal pain is completely resolved.  Bowel function is normal she is having no increased lower urinary tract symptoms diarrhea or fatigue..  COMPLICATIONS OF TREATMENT: none  FOLLOW UP COMPLIANCE: keeps appointments   PHYSICAL EXAM:  BP 111/84 (BP Location: Left Arm, Patient Position: Sitting)   Pulse 83   Temp (!) 97.1 F (36.2 C) (Tympanic)   Resp 16   Wt 139 lb 4.8 oz (63.2 kg)   BMI 25.07 kg/m  On rectal exam no evidence of mass or nodularity in the anal canal is noted.  No inguinal adenopathy is detected.  Well-developed well-nourished patient in NAD. HEENT reveals PERLA, EOMI, discs not visualized.  Oral cavity is clear. No oral mucosal lesions are identified. Neck is clear without evidence of cervical or supraclavicular adenopathy. Lungs are clear to A&P. Cardiac examination is essentially unremarkable with regular rate and rhythm without murmur rub or thrill. Abdomen is benign with no organomegaly or masses noted. Motor sensory and DTR levels are equal and symmetric in the upper and lower extremities. Cranial nerves II through XII are grossly intact. Proprioception is intact. No peripheral adenopathy or edema is identified. No motor or sensory levels are noted. Crude visual fields are within normal range.  RADIOLOGY RESULTS: No current films to review  PLAN: Present time patient is doing well with clinically complete response.  I am pleased with her overall progress.   Her side effect profile is minimal.  I have asked to see her back in 4 to 5 months for follow-up.  She continues close follow-up care with medical oncology.  Patient knows to call with any concerns.  I would like to take this opportunity to thank you for allowing me to participate in the care of your patient.[name], MD

## 2020-12-29 ENCOUNTER — Encounter: Payer: Self-pay | Admitting: Internal Medicine

## 2020-12-29 ENCOUNTER — Inpatient Hospital Stay: Payer: Medicare Other | Attending: Internal Medicine

## 2020-12-29 ENCOUNTER — Inpatient Hospital Stay (HOSPITAL_BASED_OUTPATIENT_CLINIC_OR_DEPARTMENT_OTHER): Payer: Medicare Other | Admitting: Internal Medicine

## 2020-12-29 VITALS — BP 123/80 | HR 69 | Temp 97.4°F | Resp 18 | Wt 137.0 lb

## 2020-12-29 DIAGNOSIS — Z90710 Acquired absence of both cervix and uterus: Secondary | ICD-10-CM | POA: Diagnosis not present

## 2020-12-29 DIAGNOSIS — Z7901 Long term (current) use of anticoagulants: Secondary | ICD-10-CM | POA: Diagnosis not present

## 2020-12-29 DIAGNOSIS — Z79899 Other long term (current) drug therapy: Secondary | ICD-10-CM | POA: Diagnosis not present

## 2020-12-29 DIAGNOSIS — J449 Chronic obstructive pulmonary disease, unspecified: Secondary | ICD-10-CM | POA: Diagnosis not present

## 2020-12-29 DIAGNOSIS — I739 Peripheral vascular disease, unspecified: Secondary | ICD-10-CM | POA: Diagnosis not present

## 2020-12-29 DIAGNOSIS — Z800 Family history of malignant neoplasm of digestive organs: Secondary | ICD-10-CM | POA: Diagnosis not present

## 2020-12-29 DIAGNOSIS — C211 Malignant neoplasm of anal canal: Secondary | ICD-10-CM | POA: Diagnosis present

## 2020-12-29 DIAGNOSIS — E119 Type 2 diabetes mellitus without complications: Secondary | ICD-10-CM | POA: Diagnosis not present

## 2020-12-29 DIAGNOSIS — F1290 Cannabis use, unspecified, uncomplicated: Secondary | ICD-10-CM | POA: Insufficient documentation

## 2020-12-29 DIAGNOSIS — F17210 Nicotine dependence, cigarettes, uncomplicated: Secondary | ICD-10-CM | POA: Diagnosis not present

## 2020-12-29 LAB — COMPREHENSIVE METABOLIC PANEL
ALT: 11 U/L (ref 0–44)
AST: 18 U/L (ref 15–41)
Albumin: 3.5 g/dL (ref 3.5–5.0)
Alkaline Phosphatase: 73 U/L (ref 38–126)
Anion gap: 10 (ref 5–15)
BUN: 15 mg/dL (ref 8–23)
CO2: 21 mmol/L — ABNORMAL LOW (ref 22–32)
Calcium: 8.6 mg/dL — ABNORMAL LOW (ref 8.9–10.3)
Chloride: 103 mmol/L (ref 98–111)
Creatinine, Ser: 0.83 mg/dL (ref 0.44–1.00)
GFR, Estimated: >60 mL/min (ref >60)
Glucose, Bld: 154 mg/dL — ABNORMAL HIGH (ref 70–99)
Potassium: 4.3 mmol/L (ref 3.5–5.1)
Sodium: 134 mmol/L — ABNORMAL LOW (ref 135–145)
Total Bilirubin: 0.3 mg/dL (ref 0.3–1.2)
Total Protein: 7.2 g/dL (ref 6.5–8.1)

## 2020-12-29 LAB — CBC WITH DIFFERENTIAL/PLATELET
Abs Immature Granulocytes: 0.04 10*3/uL (ref 0.00–0.07)
Basophils Absolute: 0 10*3/uL (ref 0.0–0.1)
Basophils Relative: 0 %
Eosinophils Absolute: 0.2 10*3/uL (ref 0.0–0.5)
Eosinophils Relative: 3 %
HCT: 33.2 % — ABNORMAL LOW (ref 36.0–46.0)
Hemoglobin: 11.1 g/dL — ABNORMAL LOW (ref 12.0–15.0)
Immature Granulocytes: 1 %
Lymphocytes Relative: 29 %
Lymphs Abs: 1.9 10*3/uL (ref 0.7–4.0)
MCH: 30.4 pg (ref 26.0–34.0)
MCHC: 33.4 g/dL (ref 30.0–36.0)
MCV: 91 fL (ref 80.0–100.0)
Monocytes Absolute: 0.3 10*3/uL (ref 0.1–1.0)
Monocytes Relative: 5 %
Neutro Abs: 4.3 10*3/uL (ref 1.7–7.7)
Neutrophils Relative %: 62 %
Platelets: 211 10*3/uL (ref 150–400)
RBC: 3.65 MIL/uL — ABNORMAL LOW (ref 3.87–5.11)
RDW: 14.7 % (ref 11.5–15.5)
WBC: 6.8 10*3/uL (ref 4.0–10.5)
nRBC: 0 % (ref 0.0–0.2)

## 2020-12-29 MED ORDER — HEPARIN SOD (PORK) LOCK FLUSH 100 UNIT/ML IV SOLN
INTRAVENOUS | Status: AC
  Filled 2020-12-29: qty 5

## 2020-12-29 MED ORDER — SODIUM CHLORIDE 0.9% FLUSH
10.0000 mL | INTRAVENOUS | Status: DC | PRN
  Administered 2020-12-29: 10 mL via INTRAVENOUS
  Filled 2020-12-29: qty 10

## 2020-12-29 MED ORDER — HEPARIN SOD (PORK) LOCK FLUSH 100 UNIT/ML IV SOLN
500.0000 [IU] | Freq: Once | INTRAVENOUS | Status: AC
  Administered 2020-12-29: 500 [IU] via INTRAVENOUS
  Filled 2020-12-29: qty 5

## 2020-12-29 NOTE — Progress Notes (Signed)
Patient here for oncology follow-up appointment, expresses concerns  of port discomfort, requests port removal

## 2020-12-29 NOTE — Progress Notes (Signed)
[facility] NOTE  Patient Care Team: Letta Median, MD as PCP - General (Family Medicine) Clent Jacks, RN as Oncology Nurse Navigator Benjamine Sprague, DO as Consulting Physician (Surgery) Cammie Sickle, MD as Consulting Physician (Internal Medicine)  CHIEF COMPLAINTS/PURPOSE OF CONSULTATION: anal cancer  #  Oncology History Overview Note  # [facility] 2022- ANAL CA- EXCISIONAL BIOPSY:  - INVASIVE MODERATELY DIFFERENTIATED SQUAMOUS CELL CARCINOMA, PRESENT AT DEEP AND LATERAL TISSUE EDGES. [Dr.Sakai]  # PVD [s/p Stent; Dr.Dew]on Eliquis; active smoker;   # SURVIVORSHIP:   # GENETICS:   DIAGNOSIS:   STAGE:         ;  GOALS:  CURRENT/MOST RECENT THERAPY :     Cancer of anal canal ([place])  08/18/2020 Initial Diagnosis   Cancer of anal canal ([place])   09/29/2020 -  Chemotherapy    Patient is on Treatment Plan: ANUS MITOMYCIN D1,28 / 5FU D1-4, 28-31 Q32D          HISTORY OF PRESENTING ILLNESS:  Mariah Rogers 70 y.o.  female history of COPD; anal cancer [stafe II vs III] currently on chemoradiation is here for follow-up.  Patient finished chemoradiation approximately 6 weeks ago.  Patient denies any nausea vomiting or diarrhea.  Her appetite is good.  She is gaining weight.  Denies any sores in the mouth.  Review of Systems  Constitutional:  Negative for chills, diaphoresis, fever and weight loss.  HENT:  Negative for nosebleeds and sore throat.   Eyes:  Negative for double vision.  Respiratory:  Negative for cough, hemoptysis, sputum production and wheezing.   Cardiovascular:  Negative for chest pain, palpitations, orthopnea and leg swelling.  Gastrointestinal:  Negative for abdominal pain, constipation, heartburn and melena.  Genitourinary:  Negative for dysuria, frequency and urgency.  Musculoskeletal:  Positive for back pain and joint pain.  Skin: Negative.  Negative for itching and rash.  Neurological:  Negative for dizziness,  tingling, focal weakness, weakness and headaches.  Endo/Heme/Allergies:  Does not bruise/bleed easily.  Psychiatric/Behavioral:  Negative for depression. The patient is not nervous/anxious and does not have insomnia.     MEDICAL HISTORY:  Past Medical History:  Diagnosis Date   Arthritis    LEFT KNEE    CAD (coronary artery disease)    COPD (chronic obstructive pulmonary disease) (HCC)    Depression    Emphysema lung (HCC)    GERD (gastroesophageal reflux disease)    Hernia, hiatal    Hyperlipidemia    Hypertension    Peripheral vascular disease ([place])    Pre-diabetes    Thrombosis    in Lt leg    SURGICAL HISTORY: Past Surgical History:  Procedure Laterality Date   ABDOMINAL AORTA STENT     ABDOMINAL HYSTERECTOMY     aortobifemoral bypass  2005   CESAREAN SECTION     x 3   COLONOSCOPY WITH PROPOFOL N/A 03/10/2019   Procedure: COLONOSCOPY WITH PROPOFOL;  Surgeon: Virgel Manifold, MD;  Location: [facility];  Service: Endoscopy;  Laterality: N/A;   ESOPHAGOGASTRODUODENOSCOPY (EGD) WITH PROPOFOL N/A 03/10/2019   Procedure: ESOPHAGOGASTRODUODENOSCOPY (EGD) WITH PROPOFOL;  Surgeon: Virgel Manifold, MD;  Location: [facility];  Service: Endoscopy;  Laterality: N/A;   EVALUATION UNDER ANESTHESIA WITH HEMORRHOIDECTOMY N/A 08/10/2020   Procedure: EXAM UNDER ANESTHESIA WITH HEMORRHOIDECTOMY;  Surgeon: Benjamine Sprague, DO;  Location: [facility];  Service: General;  Laterality: N/A;   femoral stents     LOWER EXTREMITY ANGIOGRAPHY Left 10/02/2016  Procedure: Lower Extremity Angiography;  Surgeon: Algernon Huxley, MD;  Location: [place] [facility];  Service: Cardiovascular;  Laterality: Left;   LOWER EXTREMITY ANGIOGRAPHY N/A 10/03/2016   Procedure: Lower Extremity Angiography with possible revascularization;  Surgeon: Algernon Huxley, MD;  Location: [place] [facility];  Service: Cardiovascular;  Laterality: N/A;   PORTACATH PLACEMENT N/A 09/13/2020   Procedure: INSERTION PORT-A-CATH;   Surgeon: Benjamine Sprague, DO;  Location: [facility];  Service: General;  Laterality: N/A;   PORTACATH PLACEMENT Right 09/28/2020   Procedure: REVISION/REPOSITIONING OF PORT-A-CATH;  Surgeon: Benjamine Sprague, DO;  Location: [facility];  Service: General;  Laterality: Right;   VASCULAR SURGERY      SOCIAL HISTORY: Social History   Socioeconomic History   Marital status: Widowed    Spouse name: Not on file   Number of children: Not on file   Years of education: Not on file   Highest education level: Not on file  Occupational History   Not on file  Tobacco Use   Smoking status: Every Day    Packs/day: 0.25    Years: 60.00    Pack years: 15.00    Types: Cigarettes   Smokeless tobacco: Never  Vaping Use   Vaping Use: Never used  Substance and Sexual Activity   Alcohol use: No   Drug use: Yes    Types: Marijuana    Comment: 2-3 times a week- smoked last night 08/09/20   Sexual activity: Never  Other Topics Concern   Not on file  Social History Narrative   Lives in [place]. Smokes-1 pck in 3 days. No alcohol. Worked in [employer]. Son with her son.    Social Determinants of Health   Financial Resource Strain: Not on file  Food Insecurity: Not on file  Transportation Needs: Not on file  Physical Activity: Not on file  Stress: Not on file  Social Connections: Not on file  Intimate Partner Violence: Not on file    FAMILY HISTORY: Family History  Problem Relation Age of Onset   Osteoporosis Mother    Alzheimer's disease Mother    Colon cancer Daughter 47    ALLERGIES:  is allergic to codeine and metformin and related.  MEDICATIONS:  Current Outpatient Medications  Medication Sig Dispense Refill   albuterol (VENTOLIN HFA) 108 (90 Base) MCG/ACT inhaler Inhale 2 puffs into the lungs every 6 (six) hours as needed for wheezing or shortness of breath.     apixaban (ELIQUIS) 5 MG TABS tablet Take 1 tablet (5 mg total) by mouth 2 (two) times daily. 90 tablet 4   aspirin EC 81 MG EC  tablet Take 1 tablet (81 mg total) by mouth daily. (Patient taking differently: Take 81 mg by mouth at bedtime.) 30 tablet 5   cholecalciferol (VITAMIN D) 1000 units tablet Take 1,000 Units by mouth 2 (two) times daily.     diphenhydrAMINE (BENADRYL) 25 MG tablet Take 50 mg by mouth at bedtime. Sleepaid     diphenoxylate-atropine (LOMOTIL) 2.5-0.025 MG tablet Take 1 tablet by mouth 4 (four) times daily as needed for diarrhea or loose stools. 60 tablet 0   docusate sodium (COLACE) 250 MG capsule Take 250 mg by mouth at bedtime.     ezetimibe-simvastatin (VYTORIN) 10-40 MG tablet Take 1 tablet by mouth at bedtime.     Fluticasone-Salmeterol (ADVAIR) 250-50 MCG/DOSE AEPB Inhale 1 puff into the lungs daily.     hydrocortisone 2.5 % cream Apply 1 application topically 2 (two) times daily as  needed (itching).     ibuprofen (ADVIL) 400 MG tablet Take 1 tablet (400 mg total) by mouth every 8 (eight) hours as needed for mild pain or moderate pain. 30 tablet 0   lidocaine (XYLOCAINE) 5 % ointment Apply 1 application topically every evening. 35.44 g 3   lisinopril (ZESTRIL) 20 MG tablet Take 20 mg by mouth daily.     nortriptyline (PAMELOR) 10 MG capsule Take 10 mg by mouth daily.     omeprazole (PRILOSEC) 20 MG capsule Take 20 mg by mouth daily.     sertraline (ZOLOFT) 100 MG tablet Take 100 mg by mouth 2 (two) times daily.     lidocaine-prilocaine (EMLA) cream Apply 30 -45 mins prior to port access. (Patient not taking: No sig reported) 30 g 0   magic mouthwash SOLN Take 5 mLs by mouth 3 (three) times daily as needed for mouth pain. (Patient not taking: No sig reported) 240 mL 0   ondansetron (ZOFRAN) 8 MG tablet One pill every 8 hours as needed for nausea/vomitting. (Patient not taking: No sig reported) 40 tablet 1   oxyCODONE (OXY IR/ROXICODONE) 5 MG immediate release tablet Take 1 tablet (5 mg total) by mouth every 4 (four) hours as needed for severe pain. (Patient not taking: No sig reported) 45 tablet 0    prochlorperazine (COMPAZINE) 10 MG tablet Take 1 tablet (10 mg total) by mouth every 6 (six) hours as needed for nausea or vomiting. (Patient not taking: No sig reported) 40 tablet 1   No current facility-administered medications for this visit.      Marland Kitchen  PHYSICAL EXAMINATION: ECOG PERFORMANCE STATUS: 1 - Symptomatic but completely ambulatory  Vitals:   12/29/20 1029  BP: 123/80  Pulse: 69  Resp: 18  Temp: (!) 97.4 F (36.3 C)  SpO2: 100%   Filed Weights   12/29/20 1029  Weight: 137 lb (62.1 kg)    Physical Exam Constitutional:      Comments: Alone.  Ambulating.   HENT:     Head: Normocephalic and atraumatic.     Mouth/Throat:     Pharynx: No oropharyngeal exudate.  Eyes:     Pupils: Pupils are equal, round, and reactive to light.  Cardiovascular:     Rate and Rhythm: Normal rate and regular rhythm.  Pulmonary:     Effort: No respiratory distress.     Breath sounds: No wheezing.     Comments: Decreased breath sounds at bases.  Abdominal:     General: Bowel sounds are normal. There is no distension.     Palpations: Abdomen is soft. There is no mass.     Tenderness: There is no abdominal tenderness. There is no guarding or rebound.  Musculoskeletal:        General: No tenderness. Normal range of motion.     Cervical back: Normal range of motion and neck supple.  Skin:    General: Skin is warm.  Neurological:     Mental Status: She is alert and oriented to person, place, and time.  Psychiatric:        Mood and Affect: Affect normal.     LABORATORY DATA:  I have reviewed the data as listed Lab Results  Component Value Date   WBC 6.8 12/29/2020   HGB 11.1 (L) 12/29/2020   HCT 33.2 (L) 12/29/2020   MCV 91.0 12/29/2020   PLT 211 12/29/2020   Recent Labs    10/16/20 0937 10/30/20 1230 12/29/20 0949  NA 137 134* 134*  K 4.1 3.8 4.3  CL 101 102 103  CO2 24 24 21*  GLUCOSE 142* 124* 154*  BUN 12 20 15   CREATININE 1.04* 1.00 0.83  CALCIUM 9.0 8.0*  8.6*  GFRNONAA 58* >60 >60  PROT 6.8 6.4* 7.2  ALBUMIN 3.5 3.1* 3.5  AST 23 22 18   ALT 15 18 11   ALKPHOS 59 58 73  BILITOT 0.3 0.3 0.3    RADIOGRAPHIC STUDIES: I have personally reviewed the radiological images as listed and agreed with the findings in the report. No results found.   ASSESSMENT & PLAN:   Cancer of anal canal (HCC) #Anal cancer squamous cell- [clinical T2N0 or T2N1] PET scan-? Slight uptake in inguinal LN. S/P- Chemo-RT [may 25th, 2022]-clinically significantly improved.  #Recommend a PET scan for further evaluation in approximately a month [about 10- 12 weeks since chemoradiation].  Discussed that there is a high likelihood that patient will be cured of the malignancy; however await PET scan.  #Diabetes:Currently not on metformin-poor tolerance FBG-154-recommend lifestyle changes/cutting down processed food carbohydrates; going up on Greenleaf vegetables protein.  However, also recommend follow-up with PCP regarding close monitoring of her blood sugars/and consideration of other oral hypoglycemic events.  # COPD-on inhalers not on oxygen-stable need to monitor closely on mitomycin.  # PVD- on eliquis-stable  # Smoker active [daughter passed]- Discussed with the patient regarding the ill effects of smoking- including but not limited to cardiac lung and vascular diseases and malignancies. Counseled against smoking.   # Mediport: Functioning stable; flushed. # port/IV access- Stable; discussed re: pro and cons of keeping the port vs. Explantation.  We will keep the port in until next visit/PET scan if negative; then recommend port explantation.  # DISPOSITION:  # follow up in 1 month- MD;no labs- PET scan prior---Dr.B  All questions were answered. The patient knows to call the clinic with any problems, questions or concerns.    Cammie Sickle, MD 12/29/2020 12:31 PM

## 2020-12-29 NOTE — Assessment & Plan Note (Addendum)
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 Slight uptake in inguinal LN. S/P- Chemo-RT [may 25th, 2022]-clinically significantly improved.  #Recommend a PET scan for further evaluation in approximately a month [about 10- 12 weeks since chemoradiation].  Discussed that there is a high likelihood that patient will be cured of the malignancy; however await PET scan.  #Diabetes:Currently not on metformin-poor tolerance FBG-154-recommend lifestyle changes/cutting down processed food carbohydrates; going up on Greenleaf vegetables protein.  However, also recommend follow-up with PCP regarding close monitoring of her blood sugars/and consideration of other oral hypoglycemic events.  # COPD-on inhalers not on oxygen-stable need to monitor closely on mitomycin.  # PVD- on eliquis-stable  # Smoker active [daughter passed]- Discussed with the patient regarding the ill effects of smoking- including but not limited to cardiac lung and vascular diseases and malignancies. Counseled against smoking.   # Mediport: Functioning stable; flushed. # port/IV access- Stable; discussed re: pro and cons of keeping the port vs. Explantation.  We will keep the port in until next visit/PET scan if negative; then recommend port explantation.  # DISPOSITION:  # follow up in 1 month- MD;no labs- PET scan prior---Dr.B

## 2021-01-12 ENCOUNTER — Ambulatory Visit (INDEPENDENT_AMBULATORY_CARE_PROVIDER_SITE_OTHER): Payer: Medicare Other | Admitting: Vascular Surgery

## 2021-01-12 ENCOUNTER — Ambulatory Visit (INDEPENDENT_AMBULATORY_CARE_PROVIDER_SITE_OTHER): Payer: Medicare Other

## 2021-01-12 ENCOUNTER — Other Ambulatory Visit: Payer: Self-pay

## 2021-01-12 ENCOUNTER — Encounter (INDEPENDENT_AMBULATORY_CARE_PROVIDER_SITE_OTHER): Payer: Self-pay | Admitting: Vascular Surgery

## 2021-01-12 VITALS — BP 136/72 | HR 75 | Resp 17 | Ht 62.0 in | Wt 136.0 lb

## 2021-01-12 DIAGNOSIS — E785 Hyperlipidemia, unspecified: Secondary | ICD-10-CM

## 2021-01-12 DIAGNOSIS — I70213 Atherosclerosis of native arteries of extremities with intermittent claudication, bilateral legs: Secondary | ICD-10-CM

## 2021-01-12 DIAGNOSIS — I739 Peripheral vascular disease, unspecified: Secondary | ICD-10-CM | POA: Diagnosis not present

## 2021-01-12 DIAGNOSIS — I10 Essential (primary) hypertension: Secondary | ICD-10-CM

## 2021-01-12 NOTE — Assessment & Plan Note (Signed)
Her ABIs today were normal with multiphasic waveforms and good digital pressures.  She is doing well.  Continue medical regimen including ASA and Plavix. Recheck in one year.

## 2021-01-12 NOTE — Assessment & Plan Note (Signed)
lipid control important in reducing the progression of atherosclerotic disease. Continue statin therapy  

## 2021-01-12 NOTE — Progress Notes (Signed)
Patient ID: Mariah Rogers, female   DOB: 07-25-1950, 70 y.o.   MRN: [id number]  Chief Complaint  Patient presents with   Follow-up    ultrasound    HPI Mariah Rogers is a 70 y.o. female.  I am asked to see the patient by Dr. Rebeca Alert for evaluation of PAD.  She was previously treated in our practice but had not been seen in over 3 years.  We actually performed an intervention about 4 to 5 years ago to reopen occluded aortobifemoral/ aortobiiliac left limb.  She has done well since this time.  She was lost to follow-up likely because of COVID and other issues.  She is not currently having any lifestyle limiting claudication, ischemic rest pain, or ulceration.  Her ABIs today were normal with multiphasic waveforms and good digital pressures.     Past Medical History:  Diagnosis Date   Arthritis    LEFT KNEE    CAD (coronary artery disease)    COPD (chronic obstructive pulmonary disease) (HCC)    Depression    Emphysema lung (HCC)    GERD (gastroesophageal reflux disease)    Hernia, hiatal    Hyperlipidemia    Hypertension    Peripheral vascular disease ([place])    Pre-diabetes    Thrombosis    in Lt leg    Past Surgical History:  Procedure Laterality Date   ABDOMINAL AORTA STENT     ABDOMINAL HYSTERECTOMY     aortobifemoral bypass  2005   CESAREAN SECTION     x 3   COLONOSCOPY WITH PROPOFOL N/A 03/10/2019   Procedure: COLONOSCOPY WITH PROPOFOL;  Surgeon: Virgel Manifold, MD;  Location: [facility];  Service: Endoscopy;  Laterality: N/A;   ESOPHAGOGASTRODUODENOSCOPY (EGD) WITH PROPOFOL N/A 03/10/2019   Procedure: ESOPHAGOGASTRODUODENOSCOPY (EGD) WITH PROPOFOL;  Surgeon: Virgel Manifold, MD;  Location: [facility];  Service: Endoscopy;  Laterality: N/A;   EVALUATION UNDER ANESTHESIA WITH HEMORRHOIDECTOMY N/A 08/10/2020   Procedure: EXAM UNDER ANESTHESIA WITH HEMORRHOIDECTOMY;  Surgeon: Benjamine Sprague, DO;  Location: [facility];  Service: General;  Laterality: N/A;    femoral stents     LOWER EXTREMITY ANGIOGRAPHY Left 10/02/2016   Procedure: Lower Extremity Angiography;  Surgeon: Algernon Huxley, MD;  Location: [place] [facility];  Service: Cardiovascular;  Laterality: Left;   LOWER EXTREMITY ANGIOGRAPHY N/A 10/03/2016   Procedure: Lower Extremity Angiography with possible revascularization;  Surgeon: Algernon Huxley, MD;  Location: [place] [facility];  Service: Cardiovascular;  Laterality: N/A;   PORTACATH PLACEMENT N/A 09/13/2020   Procedure: INSERTION PORT-A-CATH;  Surgeon: Benjamine Sprague, DO;  Location: [facility];  Service: General;  Laterality: N/A;   PORTACATH PLACEMENT Right 09/28/2020   Procedure: REVISION/REPOSITIONING OF PORT-A-CATH;  Surgeon: Benjamine Sprague, DO;  Location: [facility];  Service: General;  Laterality: Right;   VASCULAR SURGERY       Family History  Problem Relation Age of Onset   Osteoporosis Mother    Alzheimer's disease Mother    Colon cancer Daughter 49      Social History   Tobacco Use   Smoking status: Every Day    Packs/day: 0.25    Years: 60.00    Pack years: 15.00    Types: Cigarettes   Smokeless tobacco: Never  Vaping Use   Vaping Use: Never used  Substance Use Topics   Alcohol use: No   Drug use: Yes    Types: Marijuana    Comment: 2-3 times a week- smoked  last night 08/09/20     Allergies  Allergen Reactions   Codeine Nausea And Vomiting   Metformin And Related Diarrhea    Current Outpatient Medications  Medication Sig Dispense Refill   albuterol (VENTOLIN HFA) 108 (90 Base) MCG/ACT inhaler Inhale 2 puffs into the lungs every 6 (six) hours as needed for wheezing or shortness of breath.     apixaban (ELIQUIS) 5 MG TABS tablet Take 1 tablet (5 mg total) by mouth 2 (two) times daily. 90 tablet 4   aspirin EC 81 MG EC tablet Take 1 tablet (81 mg total) by mouth daily. (Patient taking differently: Take 81 mg by mouth at bedtime.) 30 tablet 5   cholecalciferol (VITAMIN D) 1000 units tablet Take 1,000 Units by  mouth 2 (two) times daily.     docusate sodium (COLACE) 250 MG capsule Take 250 mg by mouth at bedtime.     ezetimibe-simvastatin (VYTORIN) 10-40 MG tablet Take 1 tablet by mouth at bedtime.     Fluticasone-Salmeterol (ADVAIR) 250-50 MCG/DOSE AEPB Inhale 1 puff into the lungs daily.     nortriptyline (PAMELOR) 10 MG capsule Take 10 mg by mouth daily.     omeprazole (PRILOSEC) 20 MG capsule Take 20 mg by mouth daily.     sertraline (ZOLOFT) 100 MG tablet Take 100 mg by mouth 2 (two) times daily.     SPIRIVA HANDIHALER 18 MCG inhalation capsule 1 capsule daily.     diphenhydrAMINE (BENADRYL) 25 MG tablet Take 50 mg by mouth at bedtime. Sleepaid (Patient not taking: Reported on 01/12/2021)     diphenoxylate-atropine (LOMOTIL) 2.5-0.025 MG tablet Take 1 tablet by mouth 4 (four) times daily as needed for diarrhea or loose stools. (Patient not taking: Reported on 01/12/2021) 60 tablet 0   hydrocortisone 2.5 % cream Apply 1 application topically 2 (two) times daily as needed (itching). (Patient not taking: Reported on 01/12/2021)     ibuprofen (ADVIL) 400 MG tablet Take 1 tablet (400 mg total) by mouth every 8 (eight) hours as needed for mild pain or moderate pain. (Patient not taking: Reported on 01/12/2021) 30 tablet 0   lidocaine (XYLOCAINE) 5 % ointment Apply 1 application topically every evening. (Patient not taking: Reported on 01/12/2021) 35.44 g 3   lidocaine-prilocaine (EMLA) cream Apply 30 -45 mins prior to port access. (Patient not taking: No sig reported) 30 g 0   lisinopril (ZESTRIL) 20 MG tablet Take 20 mg by mouth daily. (Patient not taking: Reported on 01/12/2021)     magic mouthwash SOLN Take 5 mLs by mouth 3 (three) times daily as needed for mouth pain. (Patient not taking: No sig reported) 240 mL 0   ondansetron (ZOFRAN) 8 MG tablet One pill every 8 hours as needed for nausea/vomitting. (Patient not taking: No sig reported) 40 tablet 1   oxyCODONE (OXY IR/ROXICODONE) 5 MG immediate release  tablet Take 1 tablet (5 mg total) by mouth every 4 (four) hours as needed for severe pain. (Patient not taking: No sig reported) 45 tablet 0   prochlorperazine (COMPAZINE) 10 MG tablet Take 1 tablet (10 mg total) by mouth every 6 (six) hours as needed for nausea or vomiting. (Patient not taking: No sig reported) 40 tablet 1   No current facility-administered medications for this visit.      REVIEW OF SYSTEMS (Negative unless checked)  Constitutional: '[x]'$ Weight loss  '[]'$ Fever  '[]'$ Chills Cardiac: '[]'$ Chest pain   '[]'$ Chest pressure   '[]'$ Palpitations   '[]'$ Shortness of breath when laying flat   '[]'$   Shortness of breath at rest   '[x]'$ Shortness of breath with exertion. Vascular:  '[]'$ Pain in legs with walking   '[]'$ Pain in legs at rest   '[]'$ Pain in legs when laying flat   '[]'$ Claudication   '[]'$ Pain in feet when walking  '[]'$ Pain in feet at rest  '[]'$ Pain in feet when laying flat   '[]'$ History of DVT   '[]'$ Phlebitis   '[]'$ Swelling in legs   '[]'$ Varicose veins   '[]'$ Non-healing ulcers Pulmonary:   '[]'$ Uses home oxygen   '[]'$ Productive cough   '[]'$ Hemoptysis   '[]'$ Wheeze  '[x]'$ COPD   '[]'$ Asthma Neurologic:  '[]'$ Dizziness  '[]'$ Blackouts   '[]'$ Seizures   '[]'$ History of stroke   '[]'$ History of TIA  '[]'$ Aphasia   '[]'$ Temporary blindness   '[]'$ Dysphagia   '[]'$ Weakness or numbness in arms   '[]'$ Weakness or numbness in legs Musculoskeletal:  '[x]'$ Arthritis   '[]'$ Joint swelling   '[x]'$ Joint pain   '[]'$ Low back pain Hematologic:  '[]'$ Easy bruising  '[]'$ Easy bleeding   '[]'$ Hypercoagulable state   '[]'$ Anemic  '[]'$ Hepatitis Gastrointestinal:  '[]'$ Blood in stool   '[]'$ Vomiting blood  '[]'$ Gastroesophageal reflux/heartburn   '[]'$ Abdominal pain Genitourinary:  '[]'$ Chronic kidney disease   '[]'$ Difficult urination  '[]'$ Frequent urination  '[]'$ Burning with urination   '[]'$ Hematuria Skin:  '[]'$ Rashes   '[]'$ Ulcers   '[]'$ Wounds Psychological:  '[]'$ History of anxiety   '[]'$  History of major depression.    Physical Exam BP 136/72 (BP Location: Right Arm)   Pulse 75   Resp 17   Ht '5\' 2"'$  (1.575 m)   Wt 136 lb (61.7 kg)   BMI 24.87  kg/m  Gen:  WD/WN, NAD Head: [place]/AT, No temporalis wasting.  Ear/Nose/Throat: Hearing grossly intact, nares w/o erythema or drainage, oropharynx w/o Erythema/Exudate Eyes: Conjunctiva clear, sclera non-icteric  Neck: trachea midline.  No JVD.  Pulmonary:  Good air movement, respirations not labored, no use of accessory muscles  Cardiac: RRR, no JVD Vascular:  Vessel Right Left  Radial Palpable Palpable                          DP 2+ 2+  PT 2+ 2+   Gastrointestinal:. No masses, surgical incisions, or scars. Musculoskeletal: M/S 5/5 throughout.  Extremities without ischemic changes.  No deformity or atrophy.  No edema. Neurologic: Sensation grossly intact in extremities.  Symmetrical.  Speech is fluent. Motor exam as listed above. Psychiatric: Judgment intact, Mood & affect appropriate for pt's clinical situation. Dermatologic: No rashes or ulcers noted.  No cellulitis or open wounds.    Radiology No results found.  Labs Recent Results (from the past 2160 hour(s))  Comprehensive metabolic panel     Status: Abnormal   Collection Time: 10/16/20  9:37 AM  Result Value Ref Range   Sodium 137 135 - 145 mmol/L   Potassium 4.1 3.5 - 5.1 mmol/L   Chloride 101 98 - 111 mmol/L   CO2 24 22 - 32 mmol/L   Glucose, Bld 142 (H) 70 - 99 mg/dL    Comment: Glucose reference range applies only to samples taken after fasting for at least 8 hours.   BUN 12 8 - 23 mg/dL   Creatinine, Ser 1.04 (H) 0.44 - 1.00 mg/dL   Calcium 9.0 8.9 - 10.3 mg/dL   Total Protein 6.8 6.5 - 8.1 g/dL   Albumin 3.5 3.5 - 5.0 g/dL   AST 23 15 - 41 U/L   ALT 15 0 - 44 U/L   Alkaline Phosphatase 59 38 - 126 U/L   Total Bilirubin  0.3 0.3 - 1.2 mg/dL   GFR, Estimated 58 (L) >60 mL/min    Comment: (NOTE) Calculated using the CKD-EPI Creatinine Equation (2021)    Anion gap 12 5 - 15    Comment: Performed at [facility], [address] [place], [place] [address]  CBC with Differential     Status: Abnormal    Collection Time: 10/16/20  9:37 AM  Result Value Ref Range   WBC 5.0 4.0 - 10.5 K/uL   RBC 3.32 (L) 3.87 - 5.11 MIL/uL   Hemoglobin 9.9 (L) 12.0 - 15.0 g/dL   HCT 29.3 (L) 36.0 - 46.0 %   MCV 88.3 80.0 - 100.0 fL   MCH 29.8 26.0 - 34.0 pg   MCHC 33.8 30.0 - 36.0 g/dL   RDW 18.8 (H) 11.5 - 15.5 %   Platelets 209 150 - 400 K/uL   nRBC 0.0 0.0 - 0.2 %   Neutrophils Relative % 70 %   Neutro Abs 3.5 1.7 - 7.7 K/uL   Lymphocytes Relative 17 %   Lymphs Abs 0.8 0.7 - 4.0 K/uL   Monocytes Relative 6 %   Monocytes Absolute 0.3 0.1 - 1.0 K/uL   Eosinophils Relative 6 %   Eosinophils Absolute 0.3 0.0 - 0.5 K/uL   Basophils Relative 0 %   Basophils Absolute 0.0 0.0 - 0.1 K/uL   Immature Granulocytes 1 %   Abs Immature Granulocytes 0.04 0.00 - 0.07 K/uL    Comment: Performed at [facility], [address] [place], [place] [address]  CBC with Differential     Status: Abnormal   Collection Time: 10/30/20 10:17 AM  Result Value Ref Range   WBC 5.7 4.0 - 10.5 K/uL   RBC 3.41 (L) 3.87 - 5.11 MIL/uL   Hemoglobin 10.3 (L) 12.0 - 15.0 g/dL   HCT 30.3 (L) 36.0 - 46.0 %   MCV 88.9 80.0 - 100.0 fL   MCH 30.2 26.0 - 34.0 pg   MCHC 34.0 30.0 - 36.0 g/dL   RDW 18.6 (H) 11.5 - 15.5 %   Platelets 192 150 - 400 K/uL   nRBC 0.0 0.0 - 0.2 %   Neutrophils Relative % 72 %   Neutro Abs 4.2 1.7 - 7.7 K/uL   Lymphocytes Relative 17 %   Lymphs Abs 0.9 0.7 - 4.0 K/uL   Monocytes Relative 6 %   Monocytes Absolute 0.4 0.1 - 1.0 K/uL   Eosinophils Relative 4 %   Eosinophils Absolute 0.2 0.0 - 0.5 K/uL   Basophils Relative 0 %   Basophils Absolute 0.0 0.0 - 0.1 K/uL   Immature Granulocytes 1 %   Abs Immature Granulocytes 0.03 0.00 - 0.07 K/uL    Comment: Performed at [facility], [address] [place],  [address]  Comprehensive metabolic panel     Status: Abnormal   Collection Time: 10/30/20 12:30 PM  Result Value Ref Range   Sodium 134 (L) 135 - 145 mmol/L   Potassium 3.8  3.5 - 5.1 mmol/L   Chloride 102 98 - 111 mmol/L   CO2 24 22 - 32 mmol/L   Glucose, Bld 124 (H) 70 - 99 mg/dL    Comment: Glucose reference range applies only to samples taken after fasting for at least 8 hours.   BUN 20 8 - 23 mg/dL   Creatinine, Ser 1.00 0.44 - 1.00 mg/dL   Calcium 8.0 (L) 8.9 - 10.3 mg/dL   Total Protein 6.4 (L) 6.5 - 8.1  g/dL   Albumin 3.1 (L) 3.5 - 5.0 g/dL   AST 22 15 - 41 U/L   ALT 18 0 - 44 U/L   Alkaline Phosphatase 58 38 - 126 U/L   Total Bilirubin 0.3 0.3 - 1.2 mg/dL   GFR, Estimated >60 >60 mL/min    Comment: (NOTE) Calculated using the CKD-EPI Creatinine Equation (2021)    Anion gap 8 5 - 15    Comment: Performed at [facility], [address] [place], [place] [address]  Comprehensive metabolic panel     Status: Abnormal   Collection Time: 12/29/20  9:49 AM  Result Value Ref Range   Sodium 134 (L) 135 - 145 mmol/L   Potassium 4.3 3.5 - 5.1 mmol/L   Chloride 103 98 - 111 mmol/L   CO2 21 (L) 22 - 32 mmol/L   Glucose, Bld 154 (H) 70 - 99 mg/dL    Comment: Glucose reference range applies only to samples taken after fasting for at least 8 hours.   BUN 15 8 - 23 mg/dL   Creatinine, Ser 0.83 0.44 - 1.00 mg/dL   Calcium 8.6 (L) 8.9 - 10.3 mg/dL   Total Protein 7.2 6.5 - 8.1 g/dL   Albumin 3.5 3.5 - 5.0 g/dL   AST 18 15 - 41 U/L   ALT 11 0 - 44 U/L   Alkaline Phosphatase 73 38 - 126 U/L   Total Bilirubin 0.3 0.3 - 1.2 mg/dL   GFR, Estimated >60 >60 mL/min    Comment: (NOTE) Calculated using the CKD-EPI Creatinine Equation (2021)    Anion gap 10 5 - 15    Comment: Performed at [facility], [address] [place], [place] [address]  CBC with Differential     Status: Abnormal   Collection Time: 12/29/20  9:49 AM  Result Value Ref Range   WBC 6.8 4.0 - 10.5 K/uL   RBC 3.65 (L) 3.87 - 5.11 MIL/uL   Hemoglobin 11.1 (L) 12.0 - 15.0 g/dL   HCT 33.2 (L) 36.0 - 46.0 %   MCV 91.0 80.0 - 100.0 fL   MCH 30.4 26.0 - 34.0 pg   MCHC 33.4 30.0  - 36.0 g/dL   RDW 14.7 11.5 - 15.5 %   Platelets 211 150 - 400 K/uL   nRBC 0.0 0.0 - 0.2 %   Neutrophils Relative % 62 %   Neutro Abs 4.3 1.7 - 7.7 K/uL   Lymphocytes Relative 29 %   Lymphs Abs 1.9 0.7 - 4.0 K/uL   Monocytes Relative 5 %   Monocytes Absolute 0.3 0.1 - 1.0 K/uL   Eosinophils Relative 3 %   Eosinophils Absolute 0.2 0.0 - 0.5 K/uL   Basophils Relative 0 %   Basophils Absolute 0.0 0.0 - 0.1 K/uL   Immature Granulocytes 1 %   Abs Immature Granulocytes 0.04 0.00 - 0.07 K/uL    Comment: Performed at [facility], [address] [place], [place] [address]    Assessment/Plan: Hypertension blood pressure control important in reducing the progression of atherosclerotic disease. On appropriate oral medications.  Hyperlipidemia lipid control important in reducing the progression of atherosclerotic disease. Continue statin therapy      Leotis Pain 01/12/2021, 2:05 PM   This note was created with Dragon medical transcription system.  Any errors from dictation are unintentional.

## 2021-01-12 NOTE — Patient Instructions (Signed)
Peripheral Vascular Disease ?Peripheral vascular disease (PVD) is a disease of the blood vessels that carry blood from the heart to the rest of the body. PVD is also called peripheral artery disease (PAD) or poor circulation. PVD affects most of the body. But it affects the legs and feet the most. ?PVD can lead to acute limb ischemia. This happens when there is a sudden stop of blood flow to an arm or leg. This is a medical emergency. ?What are the causes? ?The most common cause of PVD is a buildup of a fatty substance (plaque) inside your arteries. This decreases blood flow. Plaque can break off and block blood in a smaller artery. This can lead to acute limb ischemia. ?Other common causes of PVD include: ?Blood clots inside the blood vessels. ?Injuries to blood vessels. ?Irritation and swelling of blood vessels. ?Sudden tightening of the blood vessel (spasms). ?What increases the risk? ?A family history of PVD. ?Medical conditions, including: ?High cholesterol. ?Diabetes. ?High blood pressure. ?Heart disease. ?Past problems with blood clots. ?Past injury, such as burns or a broken bone. ?Other conditions, such as: ?Buerger's disease. This is caused by swollen or irritated blood vessels in your hands and feet. ?Arthritis. ?Birth defects that affect the arteries in your legs. ?Kidney disease. ?Using tobacco or nicotine products. ?Not getting enough exercise. ?Being very overweight (obese). ?Being 50 years old or older. ?What are the signs or symptoms? ?Cramps in your butt, legs, and feet. ?Pain and weakness in your legs when you are active that goes away when you rest. ?Leg pain when at rest. ?Leg numbness, tingling, or weakness. ?Coldness in a leg or foot, especially when compared with the other leg or foot. ?Skin or hair changes. These can include: ?Hair loss. ?Shiny skin. ?Pale or bluish skin. ?Thick toenails. ?Being unable to get or keep an erection. ?Tiredness (fatigue). ?Weak pulse or no pulse in the  feet. ?Wounds and sores on the toes, feet, or legs. These take longer to heal. ?How is this treated? ?Underlying causes are treated first. Other conditions, like diabetes, high cholesterol, and blood pressure, are also treated. Treatment may include: ?Lifestyle changes, such as: ?Quitting smoking. ?Getting regular exercise. ?Having a diet low in fat and cholesterol. ?Not drinking alcohol. ?Taking medicines, such as: ?Blood thinners. ?Medicines to improve blood flow. ?Medicines to improve your blood cholesterol. ?Procedures to: ?Open the arteries and restore blood flow. ?Insert a small mesh tube (stent) to keep a blocked vessel open. ?Create a new path for blood to flow to the body (peripheral bypass). ?Remove dead tissue from a wound. ?Remove an affected leg or arm. ?Follow these instructions at home: ?Medicines ?Take over-the-counter and prescription medicines only as told by your doctor. ?If you are taking blood thinners: ?Talk with your doctor before you take any medicines that have aspirin, or NSAIDs, such as ibuprofen. ?Take medicines exactly as told. Take them at the same time each day. ?Avoid doing things that could hurt or bruise you. Take action to prevent falls. ?Wear an alert bracelet or carry a card that shows you are taking blood thinners. ?Lifestyle ?  ?Get regular exercise. Ask your doctor about how to stay active. ?Talk with your doctor about keeping a healthy weight. If needed, ask about losing weight. ?Eat a diet that is low in fat and cholesterol. If you need help, talk with your doctor. ?Do not drink alcohol. ?Do not smoke or use any products that contain nicotine or tobacco. If you need help quitting, ask your   doctor. ?General instructions ?Take good care of your feet. To do this: ?Wear shoes that fit well and feel good. ?Check your feet often for any cuts or sores. ?Get a flu shot (influenza vaccine) each year. ?Keep all follow-up visits. ?Where to find more information ?Society for Vascular  Surgery: vascular.org ?American Heart Association: heart.org ?National Heart, Lung, and Blood Institute: nhlbi.nih.gov ?Contact a doctor if: ?You have cramps in your legs when you walk. ?You have leg pain when you rest. ?Your leg or foot feels cold. ?Your skin changes. ?You cannot get or keep an erection. ?You have cuts or sores on your legs or feet that do not heal. ?Get help right away if: ?You have sudden changes in the color and feeling of your arms or legs, such as: ?Your arm or leg turns cold, numb, and blue. ?Your arm or leg becomes red, warm, swollen, painful, or numb. ?You have any signs of a stroke. "BE FAST" is an easy way to remember the main warning signs: ?B - Balance. Dizziness, sudden trouble walking, or loss of balance. ?E - Eyes. Trouble seeing or a change in how you see. ?F - Face. Sudden weakness or loss of feeling of the face. The face or eyelid may droop on one side. ?A - Arms. Weakness or loss of feeling in an arm. This happens all of a sudden and most often on one side of the body. ?S - Speech. Sudden trouble speaking, slurred speech, or trouble understanding what people say. ?T - Time. Time to call emergency services. Write down what time symptoms started. ?You have other signs of a stroke, such as: ?A sudden, very bad headache with no known cause. ?Feeling like you may vomit (nausea). ?Vomiting. ?A seizure. ?You have chest pain or trouble breathing. ?These symptoms may be an emergency. Get help right away. Call your local emergency services (911 in the [place] ?Do not wait to see if the symptoms will go away. ?Do not drive yourself to the hospital. ?Summary ?Peripheral vascular disease (PVD) is a disease of the blood vessels. ?PVD affects the legs and feet the most. ?Symptoms may include leg pain or leg numbness, tingling, and weakness. ?Treatment may include lifestyle changes, medicines, and procedures. ?This information is not intended to replace advice given to you by your health care  provider. Make sure you discuss any questions you have with your health care provider. ?Document Revised: 12/06/2019 Document Reviewed: 12/06/2019 ?Elsevier Patient Education ? 2022 [employer] ? ?

## 2021-01-22 ENCOUNTER — Ambulatory Visit
Admission: RE | Admit: 2021-01-22 | Discharge: 2021-01-22 | Disposition: A | Discharge status: Home / Self Care | Payer: Medicare Other | Source: Ambulatory Visit | Attending: Internal Medicine | Admitting: Internal Medicine

## 2021-01-22 ENCOUNTER — Other Ambulatory Visit: Payer: Self-pay

## 2021-01-22 DIAGNOSIS — J439 Emphysema, unspecified: Secondary | ICD-10-CM | POA: Insufficient documentation

## 2021-01-22 DIAGNOSIS — K449 Diaphragmatic hernia without obstruction or gangrene: Secondary | ICD-10-CM | POA: Diagnosis not present

## 2021-01-22 DIAGNOSIS — C211 Malignant neoplasm of anal canal: Secondary | ICD-10-CM | POA: Insufficient documentation

## 2021-01-22 LAB — GLUCOSE, CAPILLARY: Glucose-Capillary: 133 mg/dL — ABNORMAL HIGH (ref 70–99)

## 2021-01-22 MED ORDER — FLUDEOXYGLUCOSE F - 18 (FDG) INJECTION
7.0000 | Freq: Once | INTRAVENOUS | Status: AC | PRN
  Administered 2021-01-22: 7.75 via INTRAVENOUS

## 2021-01-26 ENCOUNTER — Encounter: Payer: Self-pay | Admitting: Internal Medicine

## 2021-01-26 ENCOUNTER — Inpatient Hospital Stay: Payer: Medicare Other | Attending: Internal Medicine | Admitting: Internal Medicine

## 2021-01-26 VITALS — BP 105/71 | HR 73 | Temp 97.9°F | Resp 16 | Ht 62.0 in | Wt 138.8 lb

## 2021-01-26 DIAGNOSIS — Z452 Encounter for adjustment and management of vascular access device: Secondary | ICD-10-CM | POA: Diagnosis not present

## 2021-01-26 DIAGNOSIS — C211 Malignant neoplasm of anal canal: Secondary | ICD-10-CM | POA: Diagnosis not present

## 2021-01-26 DIAGNOSIS — R918 Other nonspecific abnormal finding of lung field: Secondary | ICD-10-CM | POA: Diagnosis not present

## 2021-01-26 DIAGNOSIS — J449 Chronic obstructive pulmonary disease, unspecified: Secondary | ICD-10-CM | POA: Diagnosis not present

## 2021-01-26 DIAGNOSIS — Z90710 Acquired absence of both cervix and uterus: Secondary | ICD-10-CM | POA: Diagnosis not present

## 2021-01-26 DIAGNOSIS — Z7901 Long term (current) use of anticoagulants: Secondary | ICD-10-CM | POA: Insufficient documentation

## 2021-01-26 DIAGNOSIS — F17210 Nicotine dependence, cigarettes, uncomplicated: Secondary | ICD-10-CM | POA: Insufficient documentation

## 2021-01-26 DIAGNOSIS — I739 Peripheral vascular disease, unspecified: Secondary | ICD-10-CM | POA: Diagnosis not present

## 2021-01-26 DIAGNOSIS — C210 Malignant neoplasm of anus, unspecified: Secondary | ICD-10-CM | POA: Insufficient documentation

## 2021-01-26 NOTE — Progress Notes (Signed)
[facility] NOTE  Patient Care Team: Letta Median, MD as PCP - General (Family Medicine) Clent Jacks, RN as Oncology Nurse Navigator Benjamine Sprague, DO as Consulting Physician (Surgery) Cammie Sickle, MD as Consulting Physician (Internal Medicine)  CHIEF COMPLAINTS/PURPOSE OF CONSULTATION: anal cancer  #  Oncology History Overview Note  # [facility] 2022- ANAL CA- EXCISIONAL BIOPSY:  - INVASIVE MODERATELY DIFFERENTIATED SQUAMOUS CELL CARCINOMA, PRESENT AT DEEP AND LATERAL TISSUE EDGES. [Dr.Sakai]  # PVD [s/p Stent; Dr.Dew]on Eliquis; active smoker;   # SURVIVORSHIP:   # GENETICS:   DIAGNOSIS:   STAGE:         ;  GOALS:  CURRENT/MOST RECENT THERAPY :     Cancer of anal canal ([place])  08/18/2020 Initial Diagnosis   Cancer of anal canal ([place])   09/29/2020 -  Chemotherapy    Patient is on Treatment Plan: ANUS MITOMYCIN D1,28 / 5FU D1-4, 28-31 Q32D          HISTORY OF PRESENTING ILLNESS:  Mariah Rogers 70 y.o.  female history of COPD; anal cancer [stafe II vs III] currently on chemoradiation is here for follow-up/review results of the PET scan  Patient denies any nausea vomiting.  Denies chest pain.  No difficulty breathing.  Gaining weight.  No sores in the mouth  Review of Systems  Constitutional:  Negative for chills, diaphoresis, fever and weight loss.  HENT:  Negative for nosebleeds and sore throat.   Eyes:  Negative for double vision.  Respiratory:  Negative for cough, hemoptysis, sputum production and wheezing.   Cardiovascular:  Negative for chest pain, palpitations, orthopnea and leg swelling.  Gastrointestinal:  Negative for abdominal pain, constipation, heartburn and melena.  Genitourinary:  Negative for dysuria, frequency and urgency.  Musculoskeletal:  Positive for back pain and joint pain.  Skin: Negative.  Negative for itching and rash.  Neurological:  Negative for dizziness, tingling, focal weakness, weakness and  headaches.  Endo/Heme/Allergies:  Does not bruise/bleed easily.  Psychiatric/Behavioral:  Negative for depression. The patient is not nervous/anxious and does not have insomnia.     MEDICAL HISTORY:  Past Medical History:  Diagnosis Date   Arthritis    LEFT KNEE    CAD (coronary artery disease)    COPD (chronic obstructive pulmonary disease) (HCC)    Depression    Emphysema lung (HCC)    GERD (gastroesophageal reflux disease)    Hernia, hiatal    Hyperlipidemia    Hypertension    Peripheral vascular disease ([place])    Pre-diabetes    Thrombosis    in Lt leg    SURGICAL HISTORY: Past Surgical History:  Procedure Laterality Date   ABDOMINAL AORTA STENT     ABDOMINAL HYSTERECTOMY     aortobifemoral bypass  2005   CESAREAN SECTION     x 3   COLONOSCOPY WITH PROPOFOL N/A 03/10/2019   Procedure: COLONOSCOPY WITH PROPOFOL;  Surgeon: Virgel Manifold, MD;  Location: [facility];  Service: Endoscopy;  Laterality: N/A;   ESOPHAGOGASTRODUODENOSCOPY (EGD) WITH PROPOFOL N/A 03/10/2019   Procedure: ESOPHAGOGASTRODUODENOSCOPY (EGD) WITH PROPOFOL;  Surgeon: Virgel Manifold, MD;  Location: [facility];  Service: Endoscopy;  Laterality: N/A;   EVALUATION UNDER ANESTHESIA WITH HEMORRHOIDECTOMY N/A 08/10/2020   Procedure: EXAM UNDER ANESTHESIA WITH HEMORRHOIDECTOMY;  Surgeon: Benjamine Sprague, DO;  Location: [facility];  Service: General;  Laterality: N/A;   femoral stents     LOWER EXTREMITY ANGIOGRAPHY Left 10/02/2016   Procedure: Lower Extremity Angiography;  Surgeon: Algernon Huxley, MD;  Location: [facility];  Service: Cardiovascular;  Laterality: Left;   LOWER EXTREMITY ANGIOGRAPHY N/A 10/03/2016   Procedure: Lower Extremity Angiography with possible revascularization;  Surgeon: Algernon Huxley, MD;  Location: [facility];  Service: Cardiovascular;  Laterality: N/A;   PORTACATH PLACEMENT N/A 09/13/2020   Procedure: INSERTION PORT-A-CATH;  Surgeon: Benjamine Sprague, DO;  Location:  [facility];  Service: General;  Laterality: N/A;   PORTACATH PLACEMENT Right 09/28/2020   Procedure: REVISION/REPOSITIONING OF PORT-A-CATH;  Surgeon: Benjamine Sprague, DO;  Location: [facility];  Service: General;  Laterality: Right;   VASCULAR SURGERY      SOCIAL HISTORY: Social History   Socioeconomic History   Marital status: Widowed    Spouse name: Not on file   Number of children: Not on file   Years of education: Not on file   Highest education level: Not on file  Occupational History   Not on file  Tobacco Use   Smoking status: Every Day    Packs/day: 0.25    Years: 60.00    Pack years: 15.00    Types: Cigarettes   Smokeless tobacco: Never  Vaping Use   Vaping Use: Never used  Substance and Sexual Activity   Alcohol use: No   Drug use: Yes    Types: Marijuana    Comment: 2-3 times a week- smoked last night 08/09/20   Sexual activity: Never  Other Topics Concern   Not on file  Social History Narrative   Lives in [place]. Smokes-1 pck in 3 days. No alcohol. Worked in [employer]. Son with her son.    Social Determinants of Health   Financial Resource Strain: Not on file  Food Insecurity: Not on file  Transportation Needs: Not on file  Physical Activity: Not on file  Stress: Not on file  Social Connections: Not on file  Intimate Partner Violence: Not on file    FAMILY HISTORY: Family History  Problem Relation Age of Onset   Osteoporosis Mother    Alzheimer's disease Mother    Colon cancer Daughter 41    ALLERGIES:  is allergic to codeine and metformin and related.  MEDICATIONS:  Current Outpatient Medications  Medication Sig Dispense Refill   albuterol (VENTOLIN HFA) 108 (90 Base) MCG/ACT inhaler Inhale 2 puffs into the lungs every 6 (six) hours as needed for wheezing or shortness of breath.     apixaban (ELIQUIS) 5 MG TABS tablet Take 1 tablet (5 mg total) by mouth 2 (two) times daily. 90 tablet 4   aspirin EC 81 MG EC tablet Take 1 tablet (81 mg total) by  mouth daily. (Patient taking differently: Take 81 mg by mouth at bedtime.) 30 tablet 5   cholecalciferol (VITAMIN D) 1000 units tablet Take 1,000 Units by mouth 2 (two) times daily.     docusate sodium (COLACE) 250 MG capsule Take 250 mg by mouth at bedtime.     ezetimibe-simvastatin (VYTORIN) 10-40 MG tablet Take 1 tablet by mouth at bedtime.     Fluticasone-Salmeterol (ADVAIR) 250-50 MCG/DOSE AEPB Inhale 1 puff into the lungs daily.     nortriptyline (PAMELOR) 10 MG capsule Take 10 mg by mouth daily.     omeprazole (PRILOSEC) 20 MG capsule Take 20 mg by mouth daily.     sertraline (ZOLOFT) 100 MG tablet Take 100 mg by mouth 2 (two) times daily.     SPIRIVA HANDIHALER 18 MCG inhalation capsule 1 capsule daily.  diphenhydrAMINE (BENADRYL) 25 MG tablet Take 50 mg by mouth at bedtime. Sleepaid (Patient not taking: No sig reported)     diphenoxylate-atropine (LOMOTIL) 2.5-0.025 MG tablet Take 1 tablet by mouth 4 (four) times daily as needed for diarrhea or loose stools. (Patient not taking: No sig reported) 60 tablet 0   hydrocortisone 2.5 % cream Apply 1 application topically 2 (two) times daily as needed (itching). (Patient not taking: No sig reported)     ibuprofen (ADVIL) 400 MG tablet Take 1 tablet (400 mg total) by mouth every 8 (eight) hours as needed for mild pain or moderate pain. (Patient not taking: No sig reported) 30 tablet 0   lidocaine (XYLOCAINE) 5 % ointment Apply 1 application topically every evening. (Patient not taking: No sig reported) 35.44 g 3   lidocaine-prilocaine (EMLA) cream Apply 30 -45 mins prior to port access. (Patient not taking: No sig reported) 30 g 0   lisinopril (ZESTRIL) 20 MG tablet Take 20 mg by mouth daily. (Patient not taking: No sig reported)     magic mouthwash SOLN Take 5 mLs by mouth 3 (three) times daily as needed for mouth pain. (Patient not taking: No sig reported) 240 mL 0   ondansetron (ZOFRAN) 8 MG tablet One pill every 8 hours as needed for  nausea/vomitting. (Patient not taking: No sig reported) 40 tablet 1   oxyCODONE (OXY IR/ROXICODONE) 5 MG immediate release tablet Take 1 tablet (5 mg total) by mouth every 4 (four) hours as needed for severe pain. (Patient not taking: No sig reported) 45 tablet 0   prochlorperazine (COMPAZINE) 10 MG tablet Take 1 tablet (10 mg total) by mouth every 6 (six) hours as needed for nausea or vomiting. (Patient not taking: No sig reported) 40 tablet 1   No current facility-administered medications for this visit.      Marland Kitchen  PHYSICAL EXAMINATION: ECOG PERFORMANCE STATUS: 1 - Symptomatic but completely ambulatory  Vitals:   01/26/21 1009  BP: 105/71  Pulse: 73  Resp: 16  Temp: 97.9 F (36.6 C)  SpO2: 99%   Filed Weights   01/26/21 1009  Weight: 138 lb 12.8 oz (63 kg)    Physical Exam Constitutional:      Comments: Alone.  Ambulating.   HENT:     Head: Normocephalic and atraumatic.     Mouth/Throat:     Pharynx: No oropharyngeal exudate.  Eyes:     Pupils: Pupils are equal, round, and reactive to light.  Cardiovascular:     Rate and Rhythm: Normal rate and regular rhythm.  Pulmonary:     Effort: No respiratory distress.     Breath sounds: No wheezing.     Comments: Decreased breath sounds at bases.  Abdominal:     General: Bowel sounds are normal. There is no distension.     Palpations: Abdomen is soft. There is no mass.     Tenderness: no abdominal tenderness There is no guarding or rebound.  Musculoskeletal:        General: No tenderness. Normal range of motion.     Cervical back: Normal range of motion and neck supple.  Skin:    General: Skin is warm.  Neurological:     Mental Status: She is alert and oriented to person, place, and time.  Psychiatric:        Mood and Affect: Affect normal.     LABORATORY DATA:  I have reviewed the data as listed Lab Results  Component Value Date   WBC  6.8 12/29/2020   HGB 11.1 (L) 12/29/2020   HCT 33.2 (L) 12/29/2020   MCV 91.0  12/29/2020   PLT 211 12/29/2020   Recent Labs    10/16/20 0937 10/30/20 1230 12/29/20 0949  NA 137 134* 134*  K 4.1 3.8 4.3  CL 101 102 103  CO2 24 24 21*  GLUCOSE 142* 124* 154*  BUN '12 20 15  '$ CREATININE 1.04* 1.00 0.83  CALCIUM 9.0 8.0* 8.6*  GFRNONAA 58* >60 >60  PROT 6.8 6.4* 7.2  ALBUMIN 3.5 3.1* 3.5  AST '23 22 18  '$ ALT '15 18 11  '$ ALKPHOS 59 58 73  BILITOT 0.3 0.3 0.3    RADIOGRAPHIC STUDIES: I have personally reviewed the radiological images as listed and agreed with the findings in the report. NM PET Image Restag (PS) Skull Base To Thigh  Result Date: 01/23/2021 CLINICAL DATA:  Subsequent treatment strategy for anal carcinoma. Initial diagnosis 08/10/2020. EXAM: NUCLEAR MEDICINE PET SKULL BASE TO THIGH TECHNIQUE: 7.75 mCi F-18 FDG was injected intravenously. Full-ring PET imaging was performed from the skull base to thigh after the radiotracer. CT data was obtained and used for attenuation correction and anatomic localization. Fasting blood glucose: 133 mg/dl COMPARISON:  PET-CT 09/04/2020 FINDINGS: Mediastinal blood pool activity: SUV max 1.75 Liver activity: SUV max NA NECK: No hypermetabolic lymph nodes in the neck. Moderate hypermetabolism is noted in the left middle scalene muscle. Incidental CT findings: Bilateral carotid artery calcifications. CHEST: No hypermetabolic mediastinal or hilar nodes. No suspicious pulmonary nodules on the CT scan. No breast masses, supraclavicular or axillary adenopathy. Incidental CT findings: The right-sided Port-A-Cath is stable. Stable 3 mm peripheral right middle lobe pulmonary nodule on image 108/3. Stable 6 mm sub solid nodule in the superior segment of the right lower lobe on image number 89/3. No new pulmonary nodules. Stable emphysematous changes and pulmonary scarring. Stable advanced atherosclerotic calcifications involving the aorta and coronary arteries. Stable moderate to large hiatal hernia. ABDOMEN/PELVIS: No abnormal  hypermetabolic activity within the liver, pancreas, adrenal glands, or spleen. No hypermetabolic lymph nodes in the abdomen or pelvis. Very small residual focus of mild hypermetabolism the anal region. SUV max is 3.94. This was previously 14.30 Incidental CT findings: Stable surgical changes from aortoiliac bypass graft. SKELETON: No focal hypermetabolic activity to suggest skeletal metastasis. Incidental CT findings: none IMPRESSION: 1. Minimal residual hypermetabolism in the anal region but no discrete mass. Findings suggest a good response to treatment. 2. No findings for metastatic disease. 3. Stable right middle lobe and right lower lobe pulmonary nodules. 4. Stable advanced vascular disease, emphysema and hiatal hernia. Electronically Signed   By: Marijo Sanes M.D.   On: 01/23/2021 11:13   VAS [place] ABI WITH/WO TBI  Result Date: 01/12/2021  LOWER EXTREMITY DOPPLER STUDY Patient Name:  Mariah Rogers  Date of Exam:   01/12/2021 Medical Rec #: [id number]      Accession #:    [id number] Date of Birth: [birth year]      Patient Gender: F Patient Age:   70Y Exam Location:  [facility] Procedure:      VAS [place] ABI WITH/WO TBI Referring Phys: [id number] [place] --------------------------------------------------------------------------------  Indications: Follow-up of revascularizaton.  Vascular Interventions: Patient states she has a history of bilateral stents and                         possible aorta bi-femoral bypass graft. Angiogram was  performed on 10/02/2016 noting the left limb of the aorta                         bi-femoral bypass was occluced. On 10/03/2016 a PTA of                         the Left common femoral artery and distal bypass was                         performed. Comparison Study: 09/2017 Performing Technologist: Concha Norway RVT  Examination Guidelines: A complete evaluation includes at minimum, Doppler waveform signals and systolic blood pressure reading at the  level of bilateral brachial, anterior tibial, and posterior tibial arteries, when vessel segments are accessible. Bilateral testing is considered an integral part of a complete examination. Photoelectric Plethysmograph (PPG) waveforms and toe systolic pressure readings are included as required and additional duplex testing as needed. Limited examinations for reoccurring indications may be performed as noted.  ABI Findings: +---------+------------------+-----+---------+--------+ Right    Rt Pressure (mmHg)IndexWaveform Comment  +---------+------------------+-----+---------+--------+ Brachial 141                                      +---------+------------------+-----+---------+--------+ ATA      151               1.07 triphasic         +---------+------------------+-----+---------+--------+ PTA      155               1.10 triphasic         +---------+------------------+-----+---------+--------+ Great Toe114               0.81 Normal            +---------+------------------+-----+---------+--------+ +---------+------------------+-----+---------+-------+ Left     Lt Pressure (mmHg)IndexWaveform Comment +---------+------------------+-----+---------+-------+ Brachial 140                                     +---------+------------------+-----+---------+-------+ ATA      143               1.01 triphasic        +---------+------------------+-----+---------+-------+ PTA      149               1.06 triphasic        +---------+------------------+-----+---------+-------+ Great Toe114               0.81 Normal           +---------+------------------+-----+---------+-------+ +-------+-----------+-----------+------------+------------+ ABI/TBIToday's ABIToday's TBIPrevious ABIPrevious TBI +-------+-----------+-----------+------------+------------+ Right  1.10       .81        1.16        .93          +-------+-----------+-----------+------------+------------+  Left   1.06       .81        1.15        .86          +-------+-----------+-----------+------------+------------+    Preliminary      ASSESSMENT & PLAN:   Cancer of anal canal (HCC) #Anal cancer squamous cell- [clinical T2N0 or T2N1] PET scan-? Slight uptake in inguinal LN. S/P- Chemo-RT [may 25th, 2022]; AUG 8th PET scan-minimal residual hypermetabolic tumor  in the anal region but no evidence of any discrete mass.  No inguinal mass or adenopathy.  Discussed with Dr. Lysle Pearl regarding a follow-up evaluation approximately 3 months.  #Follow on a clinical basis at this time.   #COPD/lung nodules [stable; likely benign]-given active smoking I have counseled again that unfortunately she may progress from a COPD.  See below  # PVD- on eliquis-stable.  # Smoker active [daughter passed]- Discussed with the patient regarding the ill effects of smoking- including but not limited to cardiac lung and vascular diseases and malignancies. Counseled against smoking.   # Mediport: recommend  Explantation  # DISPOSITION:  # referral to Dr.Sakai re: port explanatation # follow up in 3 month- MD; lab-s cbc/cmp--Dr.B  # I reviewed the blood work- with the patient in detail; also reviewed the imaging independently [as summarized above]; and with the patient in detail.   All questions were answered. The patient knows to call the clinic with any problems, questions or concerns.    Cammie Sickle, MD 01/26/2021 2:14 PM

## 2021-01-26 NOTE — Assessment & Plan Note (Addendum)
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 Slight uptake in inguinal LN. S/P- Chemo-RT [may 25th, 2022]; AUG 8th PET scan-minimal residual hypermetabolic tumor in the anal region but no evidence of any discrete mass.  No inguinal mass or adenopathy.  Discussed with Dr. Lysle Pearl regarding a follow-up evaluation approximately 3 months.  #Follow on a clinical basis at this time.   #COPD/lung nodules [stable; likely benign]-given active smoking I have counseled again that unfortunately she may progress from a COPD.  See below  # PVD- on eliquis-stable.  # Smoker active [daughter passed]- Discussed with the patient regarding the ill effects of smoking- including but not limited to cardiac lung and vascular diseases and malignancies. Counseled against smoking.   # Mediport: recommend  Explantation  # DISPOSITION:  # referral to Dr.Sakai re: port explanatation # follow up in 3 month- MD; lab-s cbc/cmp--Dr.B  # I reviewed the blood work- with the patient in detail; also reviewed the imaging independently [as summarized above]; and with the patient in detail.

## 2021-02-05 ENCOUNTER — Encounter: Payer: Self-pay | Admitting: Emergency Medicine

## 2021-02-05 ENCOUNTER — Emergency Department
Admission: EM | Admit: 2021-02-05 | Discharge: 2021-02-05 | Disposition: A | Discharge status: Home / Self Care | Payer: Medicare Other | Attending: Emergency Medicine | Admitting: Emergency Medicine

## 2021-02-05 ENCOUNTER — Other Ambulatory Visit: Payer: Self-pay

## 2021-02-05 DIAGNOSIS — Z5321 Procedure and treatment not carried out due to patient leaving prior to being seen by health care provider: Secondary | ICD-10-CM | POA: Diagnosis not present

## 2021-02-05 DIAGNOSIS — R55 Syncope and collapse: Secondary | ICD-10-CM | POA: Insufficient documentation

## 2021-02-05 DIAGNOSIS — R112 Nausea with vomiting, unspecified: Secondary | ICD-10-CM | POA: Diagnosis not present

## 2021-02-05 DIAGNOSIS — Z7901 Long term (current) use of anticoagulants: Secondary | ICD-10-CM | POA: Diagnosis not present

## 2021-02-05 DIAGNOSIS — R42 Dizziness and giddiness: Secondary | ICD-10-CM | POA: Diagnosis not present

## 2021-02-05 LAB — BASIC METABOLIC PANEL
Anion gap: 13 (ref 5–15)
BUN: 18 mg/dL (ref 8–23)
CO2: 21 mmol/L — ABNORMAL LOW (ref 22–32)
Calcium: 9.4 mg/dL (ref 8.9–10.3)
Chloride: 102 mmol/L (ref 98–111)
Creatinine, Ser: 1 mg/dL (ref 0.44–1.00)
GFR, Estimated: >60 mL/min (ref >60)
Glucose, Bld: 218 mg/dL — ABNORMAL HIGH (ref 70–99)
Potassium: 5 mmol/L (ref 3.5–5.1)
Sodium: 136 mmol/L (ref 135–145)

## 2021-02-05 LAB — CBC
HCT: 42.6 % (ref 36.0–46.0)
Hemoglobin: 14.1 g/dL (ref 12.0–15.0)
MCH: 28.4 pg (ref 26.0–34.0)
MCHC: 33.1 g/dL (ref 30.0–36.0)
MCV: 85.9 fL (ref 80.0–100.0)
Platelets: 141 10*3/uL — ABNORMAL LOW (ref 150–400)
RBC: 4.96 MIL/uL (ref 3.87–5.11)
RDW: 14.5 % (ref 11.5–15.5)
WBC: 3.8 10*3/uL — ABNORMAL LOW (ref 4.0–10.5)
nRBC: 0 % (ref 0.0–0.2)

## 2021-02-05 MED ORDER — ONDANSETRON 4 MG PO TBDP: 4.0000 mg | ORAL_TABLET | Freq: Once | ORAL | Status: DC | PRN

## 2021-02-05 NOTE — ED Triage Notes (Signed)
Pt to ED POV c/o Dizziness, N/V. Pt states she has been throwing up x3.  Pt is cancer pt, last treatment 3 months ago. Pt takes eliquis.  Nauseous in triage, NAD noted at this time.

## 2021-02-08 ENCOUNTER — Telehealth: Payer: Self-pay | Admitting: *Deleted

## 2021-02-08 NOTE — Telephone Encounter (Signed)
[facility] Cardiology Team would like a call to discuss her Oncology Plan.

## 2021-02-09 ENCOUNTER — Encounter: Payer: Self-pay | Admitting: Internal Medicine

## 2021-02-09 NOTE — Telephone Encounter (Signed)
Dr. B is aware and he will reach out to the cardiologist

## 2021-02-11 ENCOUNTER — Telehealth: Payer: Self-pay | Admitting: Internal Medicine

## 2021-02-11 NOTE — Telephone Encounter (Signed)
On 08/26-spoke with Dr.P[place]oke-med student at [facility].  Regarding patient recent admission to the hospital for cardiac issues.  Prognosis from anal cancer excellent.   Reviewed the CT scan 8/24- "2.8 cm left adrenal gland nodule. Recommend correlation with prior imaging. It is new or enlarged from prior, further evaluation with CT adrenal mass protocol or abdominal MRI can be obtained.".  No hypermetabolic lesions noted in this most recent PET scan in earlier in AUG 2022.  CT scan-chest 2021/lung cancer screening-suggested thickening of the left adrenal gland-however-no significant uptake noted on subsequent PET scans.   Will review with our radiologists regarding the adrenal lesion.

## 2021-02-14 ENCOUNTER — Telehealth: Payer: Self-pay | Admitting: *Deleted

## 2021-02-14 NOTE — Telephone Encounter (Signed)
[facility] called asking if Dr B will sign continuation of care Home health orders for this patient.

## 2021-02-15 ENCOUNTER — Encounter: Payer: Self-pay | Admitting: Internal Medicine

## 2021-02-15 NOTE — Telephone Encounter (Signed)
Per Dr. Rogue Bussing- please have home health contact pt's pcp as Dr. Rogue Bussing did not order home health on this patient.

## 2021-02-15 NOTE — Telephone Encounter (Signed)
Call returned to [facility] and advised to contact patient PCP for orders

## 2021-04-27 ENCOUNTER — Inpatient Hospital Stay: Payer: Medicare Other | Attending: Internal Medicine

## 2021-04-27 ENCOUNTER — Inpatient Hospital Stay: Payer: Medicare Other | Admitting: Nurse Practitioner

## 2021-05-07 ENCOUNTER — Telehealth: Payer: Self-pay | Admitting: Internal Medicine

## 2021-05-07 NOTE — Telephone Encounter (Signed)
Pt resch she is aware

## 2021-05-07 NOTE — Telephone Encounter (Signed)
Pt called in to reschedule her lab appt. Please call back at [phone]

## 2021-05-18 ENCOUNTER — Inpatient Hospital Stay (HOSPITAL_BASED_OUTPATIENT_CLINIC_OR_DEPARTMENT_OTHER): Payer: Medicare Other | Admitting: Nurse Practitioner

## 2021-05-18 ENCOUNTER — Other Ambulatory Visit: Payer: Self-pay

## 2021-05-18 ENCOUNTER — Inpatient Hospital Stay: Payer: Medicare Other | Attending: Internal Medicine

## 2021-05-18 VITALS — BP 97/62 | HR 57 | Temp 97.9°F | Resp 16 | Wt 130.7 lb

## 2021-05-18 DIAGNOSIS — Z9221 Personal history of antineoplastic chemotherapy: Secondary | ICD-10-CM | POA: Diagnosis not present

## 2021-05-18 DIAGNOSIS — I739 Peripheral vascular disease, unspecified: Secondary | ICD-10-CM | POA: Diagnosis not present

## 2021-05-18 DIAGNOSIS — F17210 Nicotine dependence, cigarettes, uncomplicated: Secondary | ICD-10-CM | POA: Insufficient documentation

## 2021-05-18 DIAGNOSIS — Z08 Encounter for follow-up examination after completed treatment for malignant neoplasm: Secondary | ICD-10-CM | POA: Diagnosis not present

## 2021-05-18 DIAGNOSIS — R918 Other nonspecific abnormal finding of lung field: Secondary | ICD-10-CM | POA: Insufficient documentation

## 2021-05-18 DIAGNOSIS — I252 Old myocardial infarction: Secondary | ICD-10-CM | POA: Diagnosis not present

## 2021-05-18 DIAGNOSIS — Z923 Personal history of irradiation: Secondary | ICD-10-CM | POA: Diagnosis not present

## 2021-05-18 DIAGNOSIS — J449 Chronic obstructive pulmonary disease, unspecified: Secondary | ICD-10-CM | POA: Insufficient documentation

## 2021-05-18 DIAGNOSIS — Z85048 Personal history of other malignant neoplasm of rectum, rectosigmoid junction, and anus: Secondary | ICD-10-CM

## 2021-05-18 DIAGNOSIS — C211 Malignant neoplasm of anal canal: Secondary | ICD-10-CM

## 2021-05-18 DIAGNOSIS — E279 Disorder of adrenal gland, unspecified: Secondary | ICD-10-CM | POA: Insufficient documentation

## 2021-05-18 LAB — COMPREHENSIVE METABOLIC PANEL
ALT: 11 U/L (ref 0–44)
AST: 15 U/L (ref 15–41)
Albumin: 3.7 g/dL (ref 3.5–5.0)
Alkaline Phosphatase: 69 U/L (ref 38–126)
Anion gap: 7 (ref 5–15)
BUN: 14 mg/dL (ref 8–23)
CO2: 24 mmol/L (ref 22–32)
Calcium: 8.8 mg/dL — ABNORMAL LOW (ref 8.9–10.3)
Chloride: 104 mmol/L (ref 98–111)
Creatinine, Ser: 0.85 mg/dL (ref 0.44–1.00)
GFR, Estimated: >60 mL/min (ref >60)
Glucose, Bld: 103 mg/dL — ABNORMAL HIGH (ref 70–99)
Potassium: 3.9 mmol/L (ref 3.5–5.1)
Sodium: 135 mmol/L (ref 135–145)
Total Bilirubin: 0.6 mg/dL (ref 0.3–1.2)
Total Protein: 7.1 g/dL (ref 6.5–8.1)

## 2021-05-18 LAB — CBC WITH DIFFERENTIAL/PLATELET
Abs Immature Granulocytes: 0.02 10*3/uL (ref 0.00–0.07)
Basophils Absolute: 0 10*3/uL (ref 0.0–0.1)
Basophils Relative: 1 %
Eosinophils Absolute: 0.1 10*3/uL (ref 0.0–0.5)
Eosinophils Relative: 2 %
HCT: 34.6 % — ABNORMAL LOW (ref 36.0–46.0)
Hemoglobin: 11.6 g/dL — ABNORMAL LOW (ref 12.0–15.0)
Immature Granulocytes: 0 %
Lymphocytes Relative: 31 %
Lymphs Abs: 2 10*3/uL (ref 0.7–4.0)
MCH: 30.1 pg (ref 26.0–34.0)
MCHC: 33.5 g/dL (ref 30.0–36.0)
MCV: 89.9 fL (ref 80.0–100.0)
Monocytes Absolute: 0.4 10*3/uL (ref 0.1–1.0)
Monocytes Relative: 7 %
Neutro Abs: 3.7 10*3/uL (ref 1.7–7.7)
Neutrophils Relative %: 59 %
Platelets: 219 10*3/uL (ref 150–400)
RBC: 3.85 MIL/uL — ABNORMAL LOW (ref 3.87–5.11)
RDW: 14.5 % (ref 11.5–15.5)
WBC: 6.3 10*3/uL (ref 4.0–10.5)
nRBC: 0 % (ref 0.0–0.2)

## 2021-05-18 NOTE — Progress Notes (Signed)
Pt requesting port removal. Pt reports having heart attack in August. Pt and doctors have concerns about her being off of blood thinners long enough to have port removed.   Discussed pt request with Dr. Rogue Bussing. He states he is ok with port being removed contingent on being cleared from cardiology standpoint. Pt informed of Dr B advice. Pt verbalized understanding at this time.

## 2021-05-18 NOTE — Progress Notes (Signed)
[place] NOTE  Patient Care Team: Letta Median, MD as PCP - General (Family Medicine) Clent Jacks, RN as Oncology Nurse Navigator Benjamine Sprague, DO as Consulting Physician (Surgery) Cammie Sickle, MD as Consulting Physician (Internal Medicine)  CHIEF COMPLAINTS/PURPOSE OF CONSULTATION: anal cancer  #  Oncology History Overview Note  # [facility] 2022- ANAL CA- EXCISIONAL BIOPSY:  - INVASIVE MODERATELY DIFFERENTIATED SQUAMOUS CELL CARCINOMA, PRESENT AT DEEP AND LATERAL TISSUE EDGES. 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  female history of COPD; anal cancer [stafe II vs III] s/p chemoradiation therapy for SCC of anus returns to clinic for continued surveillance.  Patient denies nausea, vomiting, painful bowel movements, blood in her stool.  No difficulty breathing.  She had a PET scan in August that showed minimal residual hypermetabolic activity in the anal region with no discrete mass.  She had a 2.8 cm left adrenal nodule found on scrips but not hypermetabolic on PET.  She recently had a stent placed but overall was doing well and denies complaints.   Review of Systems  Constitutional:  Negative for chills, fever, malaise/fatigue and weight loss.  HENT:  Negative for hearing loss, nosebleeds, sore throat and tinnitus.   Eyes:  Negative for blurred vision and double vision.  Respiratory:  Negative for cough, hemoptysis, shortness of breath and wheezing.   Cardiovascular:  Negative for chest pain, palpitations and leg swelling.  Gastrointestinal:   Negative for abdominal pain, blood in stool, constipation, diarrhea, melena, nausea and vomiting.  Genitourinary:  Negative for dysuria and urgency.  Musculoskeletal:  Negative for back pain, falls, joint pain and myalgias.  Skin:  Negative for itching and rash.  Neurological:  Negative for dizziness, tingling, sensory change, loss of consciousness, weakness and headaches.  Endo/Heme/Allergies:  Negative for environmental allergies. Does not bruise/bleed easily.  Psychiatric/Behavioral:  Negative for depression. The patient is not nervous/anxious and does not have insomnia.     MEDICAL HISTORY:  Past Medical History:  Diagnosis Date   Arthritis    LEFT KNEE    CAD (coronary artery disease)    COPD (chronic obstructive pulmonary disease) (HCC)    Depression    Emphysema lung (HCC)    GERD (gastroesophageal reflux disease)    Hernia, hiatal    Hyperlipidemia    Hypertension    Peripheral vascular disease ([place])    Pre-diabetes    Thrombosis    in Lt leg    SURGICAL HISTORY: Past Surgical History:  Procedure Laterality Date   ABDOMINAL AORTA STENT     ABDOMINAL HYSTERECTOMY     aortobifemoral bypass  2005   CESAREAN SECTION     x 3   COLONOSCOPY WITH PROPOFOL N/A 03/10/2019   Procedure: COLONOSCOPY WITH PROPOFOL;  Surgeon: Virgel Manifold, MD;  Location: [facility];  Service: Endoscopy;  Laterality: N/A;   ESOPHAGOGASTRODUODENOSCOPY (EGD) WITH PROPOFOL N/A 03/10/2019   Procedure: ESOPHAGOGASTRODUODENOSCOPY (EGD) WITH PROPOFOL;  Surgeon: Bonna Gains,  Lennette Bihari, MD;  Location: [facility];  Service: Endoscopy;  Laterality: N/A;   EVALUATION UNDER ANESTHESIA WITH HEMORRHOIDECTOMY N/A 08/10/2020   Procedure: EXAM UNDER ANESTHESIA WITH HEMORRHOIDECTOMY;  Surgeon: Benjamine Sprague, DO;  Location: [facility] OR[facility];  Service: General;  Laterality: N/A;   femoral stents     LOWER EXTREMITY ANGIOGRAPHY Left 10/02/2016   Procedure: Lower Extremity Angiography;  Surgeon: Algernon Huxley, MD;   Location: [place] [facility];  Service: Cardiovascular;  Laterality: Left;   LOWER EXTREMITY ANGIOGRAPHY N/A 10/03/2016   Procedure: Lower Extremity Angiography with possible revascularization;  Surgeon: Algernon Huxley, MD;  Location: [place] [facility];  Service: Cardiovascular;  Laterality: N/A;   PORTACATH PLACEMENT N/A 09/13/2020   Procedure: INSERTION PORT-A-CATH;  Surgeon: Benjamine Sprague, DO;  Location: [facility];  Service: General;  Laterality: N/A;   PORTACATH PLACEMENT Right 09/28/2020   Procedure: REVISION/REPOSITIONING OF PORT-A-CATH;  Surgeon: Benjamine Sprague, DO;  Location: [facility];  Service: General;  Laterality: Right;   VASCULAR SURGERY      SOCIAL HISTORY: Social History   Socioeconomic History   Marital status: Widowed    Spouse name: Not on file   Number of children: Not on file   Years of education: Not on file   Highest education level: Not on file  Occupational History   Not on file  Tobacco Use   Smoking status: Every Day    Packs/day: 0.25    Years: 60.00    Pack years: 15.00    Types: Cigarettes   Smokeless tobacco: Never  Vaping Use   Vaping Use: Never used  Substance and Sexual Activity   Alcohol use: No   Drug use: Yes    Types: Marijuana    Comment: 2-3 times a week- smoked last night 08/09/20   Sexual activity: Never  Other Topics Concern   Not on file  Social History Narrative   Lives in [place]. Smokes-1 pck in 3 days. No alcohol. Worked in [employer]. Son with her son.    Social Determinants of Health   Financial Resource Strain: Not on file  Food Insecurity: Not on file  Transportation Needs: Not on file  Physical Activity: Not on file  Stress: Not on file  Social Connections: Not on file  Intimate Partner Violence: Not on file    FAMILY HISTORY: Family History  Problem Relation Age of Onset   Osteoporosis Mother    Alzheimer's disease Mother    Colon cancer Daughter 53    ALLERGIES:  is allergic to codeine and metformin and  related.  MEDICATIONS:  Current Outpatient Medications  Medication Sig Dispense Refill   albuterol (VENTOLIN HFA) 108 (90 Base) MCG/ACT inhaler Inhale 2 puffs into the lungs every 6 (six) hours as needed for wheezing or shortness of breath.     apixaban (ELIQUIS) 5 MG TABS tablet Take 1 tablet (5 mg total) by mouth 2 (two) times daily. 90 tablet 4   aspirin EC 81 MG EC tablet Take 1 tablet (81 mg total) by mouth daily. (Patient taking differently: Take 81 mg by mouth at bedtime.) 30 tablet 5   atorvastatin (LIPITOR) 80 MG tablet Take 80 mg by mouth daily.     cholecalciferol (VITAMIN D) 1000 units tablet Take 1,000 Units by mouth 2 (two) times daily.     clopidogrel (PLAVIX) 75 MG tablet Take 75 mg by mouth daily.     escitalopram (LEXAPRO) 10 MG tablet Take by mouth.     ezetimibe (ZETIA) 10  MG tablet Take 10 mg by mouth at bedtime.     Fluticasone-Salmeterol (ADVAIR) 250-50 MCG/DOSE AEPB Inhale 1 puff into the lungs daily.     lisinopril (ZESTRIL) 5 MG tablet Take 5 mg by mouth daily.     metoprolol tartrate (LOPRESSOR) 25 MG tablet Take 12.5 mg by mouth 2 (two) times daily.     sertraline (ZOLOFT) 100 MG tablet Take 100 mg by mouth 2 (two) times daily.     SPIRIVA HANDIHALER 18 MCG inhalation capsule 1 capsule daily.     diphenhydrAMINE (BENADRYL) 25 MG tablet Take 50 mg by mouth at bedtime. Sleepaid (Patient not taking: Reported on 01/12/2021)     diphenoxylate-atropine (LOMOTIL) 2.5-0.025 MG tablet Take 1 tablet by mouth 4 (four) times daily as needed for diarrhea or loose stools. (Patient not taking: Reported on 01/12/2021) 60 tablet 0   docusate sodium (COLACE) 250 MG capsule Take 250 mg by mouth at bedtime. (Patient not taking: Reported on 05/18/2021)     ezetimibe-simvastatin (VYTORIN) 10-40 MG tablet Take 1 tablet by mouth at bedtime. (Patient not taking: Reported on 05/18/2021)     hydrocortisone 2.5 % cream Apply 1 application topically 2 (two) times daily as needed (itching). (Patient  not taking: Reported on 01/12/2021)     ibuprofen (ADVIL) 400 MG tablet Take 1 tablet (400 mg total) by mouth every 8 (eight) hours as needed for mild pain or moderate pain. (Patient not taking: Reported on 01/12/2021) 30 tablet 0   lidocaine (XYLOCAINE) 5 % ointment Apply 1 application topically every evening. (Patient not taking: Reported on 01/12/2021) 35.44 g 3   lidocaine-prilocaine (EMLA) cream Apply 30 -45 mins prior to port access. (Patient not taking: Reported on 10/16/2020) 30 g 0   lisinopril (ZESTRIL) 20 MG tablet Take 20 mg by mouth daily. (Patient not taking: Reported on 01/12/2021)     magic mouthwash SOLN Take 5 mLs by mouth 3 (three) times daily as needed for mouth pain. (Patient not taking: Reported on 12/21/2020) 240 mL 0   nicotine (NICODERM CQ - DOSED IN MG/24 HOURS) 14 mg/24hr patch Place onto the skin. (Patient not taking: Reported on 05/18/2021)     nortriptyline (PAMELOR) 10 MG capsule Take 10 mg by mouth daily. (Patient not taking: Reported on 05/18/2021)     omeprazole (PRILOSEC) 20 MG capsule Take 20 mg by mouth daily. (Patient not taking: Reported on 05/18/2021)     ondansetron (ZOFRAN) 8 MG tablet One pill every 8 hours as needed for nausea/vomitting. (Patient not taking: Reported on 12/21/2020) 40 tablet 1   oxyCODONE (OXY IR/ROXICODONE) 5 MG immediate release tablet Take 1 tablet (5 mg total) by mouth every 4 (four) hours as needed for severe pain. (Patient not taking: Reported on 12/21/2020) 45 tablet 0   pantoprazole (PROTONIX) 20 MG tablet Take 20 mg by mouth daily. (Patient not taking: Reported on 05/18/2021)     prochlorperazine (COMPAZINE) 10 MG tablet Take 1 tablet (10 mg total) by mouth every 6 (six) hours as needed for nausea or vomiting. (Patient not taking: Reported on 12/21/2020) 40 tablet 1   No current facility-administered medications for this visit.   Marland Kitchen  PHYSICAL EXAMINATION: ECOG PERFORMANCE STATUS: 1 - Symptomatic but completely ambulatory  Vitals:   05/18/21  1308  BP: 97/62  Pulse: (!) 57  Resp: 16  Temp: 97.9 F (36.6 C)  SpO2: 99%   Filed Weights   05/18/21 1308  Weight: 130 lb 11.2 oz (59.3 kg)    Physical  Exam Constitutional:      Appearance: She is not ill-appearing.  Eyes:     General: No scleral icterus.    Conjunctiva/sclera: Conjunctivae normal.  Cardiovascular:     Rate and Rhythm: Normal rate and regular rhythm.  Abdominal:     General: There is no distension.     Palpations: Abdomen is soft.     Tenderness: There is no abdominal tenderness. There is no guarding.  Musculoskeletal:        General: No deformity.     Right lower leg: No edema.     Left lower leg: No edema.  Lymphadenopathy:     Cervical: No cervical adenopathy.  Skin:    General: Skin is warm and dry.  Neurological:     Mental Status: She is alert and oriented to person, place, and time. Mental status is at baseline.  Psychiatric:        Mood and Affect: Mood normal.        Behavior: Behavior normal.    LABORATORY DATA:  I have reviewed the data as listed Lab Results  Component Value Date   WBC 6.3 05/18/2021   HGB 11.6 (L) 05/18/2021   HCT 34.6 (L) 05/18/2021   MCV 89.9 05/18/2021   PLT 219 05/18/2021   Recent Labs    10/16/20 0937 10/30/20 1230 12/29/20 0949 02/05/21 1708  NA 137 134* 134* 136  K 4.1 3.8 4.3 5.0  CL 101 102 103 102  CO2 24 24 21* 21*  GLUCOSE 142* 124* 154* 218*  BUN 12 20 15 18   CREATININE 1.04* 1.00 0.83 1.00  CALCIUM 9.0 8.0* 8.6* 9.4  GFRNONAA 58* >60 >60 >60  PROT 6.8 6.4* 7.2  --   ALBUMIN 3.5 3.1* 3.5  --   AST 23 22 18   --   ALT 15 18 11   --   ALKPHOS 59 58 73  --   BILITOT 0.3 0.3 0.3  --      RADIOGRAPHIC STUDIES: I have personally reviewed the radiological images as listed and agreed with the findings in the report. No results found.   ASSESSMENT & PLAN:   1.  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 She is s/p chemo  and radiation completed Nov 08, 2020.  January 22, 2021 PET scan showed minimal residual hypermetabolic tumor in the anal region but no evidence of any discrete mass.  No inguinal mass or adenopathy. I recommended that she follow up with Dr. Lysle Pearl for anoscopy. Reviewed NCCN Guidelines including DRE every 3-6 months for 5 years, inguinal node palpation every 3-6 months for 5 years, anoscopy every 6-12 months x 3 years, and imaging annually for 3 years.   2. Port a cath-due to recent heart attack in August 2021. She needs to stay on blood thinners without any interruption for at least 6 months.  Would postpone port removal until after that time and then would follow recommendations of cardiology regarding holding of blood thinners.  Patient agreeable. She will continue port flushes every 8-12 weeks.   3. COPD & Lung nodules-previous imaging revealed stable nodules.  Patient continues to smoke.  Again encouraged smoking cessation and overall health benefits of avoiding cigarette smoking.  4.  PVD and history of NSTEMI-on Eliquis.  5. Adrenal Nodule- 2.8 cm left adrenal gland nodule on 02/07/21 CT from [facility]. Will order adrenal protocol CT to evaluate further  DISPOSITION:  Port flush.  CT  scan Adrenal Protocol to follow up on Adrenal nodule Referral to Dr. Lysle Pearl for anoscopy RTC in 3 months for labs (cbc, cmp) and follow up with Dr. Rogue Bussing  No problem-specific Assessment & Plan notes found for this encounter.  All questions were answered. The patient knows to call the clinic with any problems, questions or concerns.   Verlon Au, NP 05/18/2021   CC: Dr. Rogue Bussing

## 2021-05-21 ENCOUNTER — Inpatient Hospital Stay: Payer: Medicare Other

## 2021-05-21 ENCOUNTER — Other Ambulatory Visit: Payer: Self-pay

## 2021-05-21 DIAGNOSIS — Z95828 Presence of other vascular implants and grafts: Secondary | ICD-10-CM

## 2021-05-21 DIAGNOSIS — Z85048 Personal history of other malignant neoplasm of rectum, rectosigmoid junction, and anus: Secondary | ICD-10-CM | POA: Diagnosis not present

## 2021-05-21 MED ORDER — SODIUM CHLORIDE 0.9% FLUSH
10.0000 mL | INTRAVENOUS | Status: DC | PRN
  Administered 2021-05-21: 10 mL via INTRAVENOUS
  Filled 2021-05-21: qty 10

## 2021-05-21 MED ORDER — HEPARIN SOD (PORK) LOCK FLUSH 100 UNIT/ML IV SOLN
500.0000 [IU] | Freq: Once | INTRAVENOUS | Status: AC
  Administered 2021-05-21: 500 [IU] via INTRAVENOUS
  Filled 2021-05-21: qty 5

## 2021-05-23 ENCOUNTER — Encounter: Payer: Self-pay | Admitting: Internal Medicine

## 2021-05-23 ENCOUNTER — Ambulatory Visit
Admission: RE | Admit: 2021-05-23 | Discharge: 2021-05-23 | Disposition: A | Discharge status: Home / Self Care | Payer: Medicare Other | Source: Ambulatory Visit | Attending: Radiation Oncology | Admitting: Radiation Oncology

## 2021-05-23 ENCOUNTER — Encounter: Payer: Self-pay | Admitting: Radiation Oncology

## 2021-05-23 ENCOUNTER — Other Ambulatory Visit: Payer: Self-pay

## 2021-05-23 VITALS — BP 134/59 | HR 58 | Temp 97.6°F | Wt 132.0 lb

## 2021-05-23 DIAGNOSIS — R197 Diarrhea, unspecified: Secondary | ICD-10-CM | POA: Insufficient documentation

## 2021-05-23 DIAGNOSIS — Z923 Personal history of irradiation: Secondary | ICD-10-CM | POA: Insufficient documentation

## 2021-05-23 DIAGNOSIS — C210 Malignant neoplasm of anus, unspecified: Secondary | ICD-10-CM | POA: Diagnosis present

## 2021-05-23 DIAGNOSIS — C211 Malignant neoplasm of anal canal: Secondary | ICD-10-CM

## 2021-05-23 NOTE — Patient Instructions (Signed)
Please contact Dr. Ines Bloomer office at [facility] to schedule an appointment: [phone].

## 2021-05-23 NOTE — Progress Notes (Signed)
Radiation Oncology Follow up Note  Name: Mariah Rogers   Date:   05/23/2021 MRN:  [id number] DOB: 07-10-1950    This 70 y.o. female presents to the clinic today for 91-month follow-up status post concurrent chemoradiation therapy for squamous cell carcinoma the anus.  REFERRING PROVIDER: Letta Median, MD  HPI: Patient is a 70 year old female now about 6 months having completed concurrent chemoradiation therapy for squamous cell carcinoma the anus.  Seen today in routine follow-up she still having some slight rectal pain.  She tends towards diarrhea does not take Imodium.  Does not follow-up follow any low residue diet restrictions..She had a PET CT scan back in August showing only minimal residual hypermetabolic activity in the anal region with no discrete mass.  She has had a 2.8 cm left adrenal nodule found on scans not hypermetabolic on PET scan she recently had a stent placed and is otherwise doing well.  COMPLICATIONS OF TREATMENT: none  FOLLOW UP COMPLIANCE: keeps appointments   PHYSICAL EXAM:  BP (!) 134/59   Pulse (!) 58   Temp 97.6 F (36.4 C) (Tympanic)   Wt 132 lb (59.9 kg)   BMI 24.14 kg/m  On rectal exam there is firm most likely exophytic hemorrhoid noted.  She has significant rectal pain on examination.  No inguinal adenopathy is appreciated.  Well-developed well-nourished patient in NAD. HEENT reveals PERLA, EOMI, discs not visualized.  Oral cavity is clear. No oral mucosal lesions are identified. Neck is clear without evidence of cervical or supraclavicular adenopathy. Lungs are clear to A&P. Cardiac examination is essentially unremarkable with regular rate and rhythm without murmur rub or thrill. Abdomen is benign with no organomegaly or masses noted. Motor sensory and DTR levels are equal and symmetric in the upper and lower extremities. Cranial nerves II through XII are grossly intact. Proprioception is intact. No peripheral adenopathy or edema is identified. No  motor or sensory levels are noted. Crude visual fields are within normal range.  RADIOLOGY RESULTS: PET CT scan reviewed compatible with above-stated findings  PLAN: Present time patient is doing well.  She may need endoscopic examination at some point to differentiate between a hemorrhoid and possible residual disease although PET/CT argues for this to be a thrombosed hemorrhoid.  I have asked to see her back in 6 months for follow-up.  I have made follow-up appointment with Dr. Lynett Fish for the patient.  I am starting her and given her instructions for low residue diet and to take Imodium as needed.  Patient knows to call with any concerns.  I would like to take this opportunity to thank you for allowing me to participate in the care of your patient.[name], MD

## 2021-06-04 ENCOUNTER — Telehealth: Payer: Self-pay | Admitting: Internal Medicine

## 2021-06-04 NOTE — Telephone Encounter (Signed)
Pt called to reschedule her appt on 06-20-21. Call back at [phone]

## 2021-06-05 NOTE — Telephone Encounter (Signed)
done

## 2021-06-18 ENCOUNTER — Encounter: Payer: Self-pay | Admitting: Internal Medicine

## 2021-06-20 ENCOUNTER — Other Ambulatory Visit: Payer: Medicare Other

## 2021-06-20 ENCOUNTER — Ambulatory Visit: Payer: Medicare Other | Admitting: Internal Medicine

## 2021-06-25 ENCOUNTER — Inpatient Hospital Stay: Payer: Commercial Managed Care - HMO | Attending: Internal Medicine | Admitting: Internal Medicine

## 2021-06-25 ENCOUNTER — Other Ambulatory Visit: Payer: Self-pay

## 2021-06-25 DIAGNOSIS — Z79899 Other long term (current) drug therapy: Secondary | ICD-10-CM | POA: Insufficient documentation

## 2021-06-25 DIAGNOSIS — F1290 Cannabis use, unspecified, uncomplicated: Secondary | ICD-10-CM | POA: Diagnosis not present

## 2021-06-25 DIAGNOSIS — F17210 Nicotine dependence, cigarettes, uncomplicated: Secondary | ICD-10-CM | POA: Insufficient documentation

## 2021-06-25 DIAGNOSIS — C211 Malignant neoplasm of anal canal: Secondary | ICD-10-CM

## 2021-06-25 DIAGNOSIS — I10 Essential (primary) hypertension: Secondary | ICD-10-CM | POA: Insufficient documentation

## 2021-06-25 DIAGNOSIS — J449 Chronic obstructive pulmonary disease, unspecified: Secondary | ICD-10-CM | POA: Diagnosis not present

## 2021-06-25 DIAGNOSIS — C210 Malignant neoplasm of anus, unspecified: Secondary | ICD-10-CM | POA: Insufficient documentation

## 2021-06-25 NOTE — Assessment & Plan Note (Addendum)
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 Slight uptake in inguinal LN. S/P- Chemo-RT [may 25th, 2022]; AUG 8th PET scan-minimal residual hypermetabolic tumor in the anal region but no evidence of any discrete mass.  No inguinal mass or adenopathy.   #Recommend evaluation with surgery/and if negative for any recurrent disease would recommend explantation before.  I have sent a message for Dr. Lysle Pearl  #Adrenal nodule [CT [facility] 2022[facility]]-awaiting CT scan March 2023/[facility].   #COPD/lung nodules [stable; likely benign]-given active smoking I have counseled again that unfortunately she may progress from a COPD.  See below  # PVD- on eliquis-stable.  # Smoker active again recommended patient quit smoking; especially in the context of her COPD/CAD and risk for  lung and other malignancies.   # Mediport: recommend  Explantation- sent message to Dr.Sakai.   # DISPOSITION:  #Keep CT scan as planned in March; canceling the appointment in March # follow up in 71 month- MD; lab-s cbc/cmp--Dr.B

## 2021-06-25 NOTE — Progress Notes (Signed)
Pt concerned with how to arrange getting her port removed and getting approval with her cardiologist.

## 2021-06-25 NOTE — Progress Notes (Signed)
[place] NOTE  Patient Care Team: Letta Median, MD as PCP - General (Family Medicine) Clent Jacks, RN as Oncology Nurse Navigator Benjamine Sprague, DO as Consulting Physician (Surgery) Cammie Sickle, MD as Consulting Physician (Oncology) [name], MD as Consulting Physician (Radiation Oncology) Virgel Manifold, MD as Consulting Physician (Gastroenterology)  CHIEF COMPLAINTS/PURPOSE OF CONSULTATION: anal cancer  #  Oncology History Overview Note  # [facility] 2022- ANAL CA- EXCISIONAL BIOPSY:  - INVASIVE MODERATELY DIFFERENTIATED SQUAMOUS CELL CARCINOMA, PRESENT AT DEEP AND LATERAL TISSUE EDGES. [Dr.Sakai]  # PVD [s/p Stent; Dr.Dew]on Eliquis; active smoker;   # SURVIVORSHIP:   # GENETICS:   DIAGNOSIS:   STAGE:         ;  GOALS:  CURRENT/MOST RECENT THERAPY :     Cancer of anal canal ([place])  08/18/2020 Initial Diagnosis   Cancer of anal canal ([place])   09/29/2020 -  Chemotherapy    Patient is on Treatment Plan: ANUS MITOMYCIN D1,28 / 5FU D1-4, 28-31 Q32D         HISTORY OF PRESENTING ILLNESS: Alone.  Ambulating independently.  Mariah Rogers 71 y.o.  female history of COPD; anal cancer [stafe II vs III] currently on chemoradiation is here for follow-up.  In the interim patient was evaluated at [facility] a MI. patient had stent placement.   Patient did not follow-up with surgery as recommended because of above cardiac event.  Patient denies any nausea vomiting.  Denies chest pain.  No difficulty breathing.  Gaining weight.    Review of Systems  Constitutional:  Negative for chills, diaphoresis, fever and weight loss.  HENT:  Negative for nosebleeds and sore throat.   Eyes:  Negative for double vision.  Respiratory:  Negative for cough, hemoptysis, sputum production and wheezing.   Cardiovascular:  Negative for chest pain, palpitations, orthopnea and leg swelling.  Gastrointestinal:  Negative for abdominal pain,  constipation, heartburn and melena.  Genitourinary:  Negative for dysuria, frequency and urgency.  Musculoskeletal:  Positive for back pain and joint pain.  Skin: Negative.  Negative for itching and rash.  Neurological:  Negative for dizziness, tingling, focal weakness, weakness and headaches.  Endo/Heme/Allergies:  Does not bruise/bleed easily.  Psychiatric/Behavioral:  Negative for depression. The patient is not nervous/anxious and does not have insomnia.     MEDICAL HISTORY:  Past Medical History:  Diagnosis Date   Arthritis    LEFT KNEE    CAD (coronary artery disease)    COPD (chronic obstructive pulmonary disease) (HCC)    Depression    Emphysema lung (HCC)    GERD (gastroesophageal reflux disease)    Hernia, hiatal    Hyperlipidemia    Hypertension    Peripheral vascular disease ([place])    Pre-diabetes    Thrombosis    in Lt leg    SURGICAL HISTORY: Past Surgical History:  Procedure Laterality Date   ABDOMINAL AORTA STENT     ABDOMINAL HYSTERECTOMY     aortobifemoral bypass  2005   CESAREAN SECTION     x 3   COLONOSCOPY WITH PROPOFOL N/A 03/10/2019   Procedure: COLONOSCOPY WITH PROPOFOL;  Surgeon: Virgel Manifold, MD;  Location: [facility] ENDOS[facility];  Service: Endoscopy;  Laterality: N/A;   ESOPHAGOGASTRODUODENOSCOPY (EGD) WITH PROPOFOL N/A 03/10/2019   Procedure: ESOPHAGOGASTRODUODENOSCOPY (EGD) WITH PROPOFOL;  Surgeon: Virgel Manifold, MD;  Location: [facility] ENDOSCOPY;  Service: Endoscopy;  Laterality: N/A;   EVALUATION UNDER ANESTHESIA WITH HEMORRHOIDECTOMY N/A 08/10/2020   Procedure: Jasmine December  UNDER ANESTHESIA WITH HEMORRHOIDECTOMY;  Surgeon: Benjamine Sprague, DO;  Location: [facility] ORS;  Service: General;  Laterality: N/A;   femoral stents     LOWER EXTREMITY ANGIOGRAPHY Left 10/02/2016   Procedure: Lower Extremity Angiography;  Surgeon: Algernon Huxley, MD;  Location: [place] [facility];  Service: Cardiovascular;  Laterality: Left;   LOWER EXTREMITY ANGIOGRAPHY N/A 10/03/2016    Procedure: Lower Extremity Angiography with possible revascularization;  Surgeon: Algernon Huxley, MD;  Location: [place] [facility];  Service: Cardiovascular;  Laterality: N/A;   PORTACATH PLACEMENT N/A 09/13/2020   Procedure: INSERTION PORT-A-CATH;  Surgeon: Benjamine Sprague, DO;  Location: [facility] OR[facility];  Service: General;  Laterality: N/A;   PORTACATH PLACEMENT Right 09/28/2020   Procedure: REVISION/REPOSITIONING OF PORT-A-CATH;  Surgeon: Benjamine Sprague, DO;  Location: [facility] OR[facility];  Service: General;  Laterality: Right;   VASCULAR SURGERY      SOCIAL HISTORY: Social History   Socioeconomic History   Marital status: Widowed    Spouse name: Not on file   Number of children: Not on file   Years of education: Not on file   Highest education level: Not on file  Occupational History   Not on file  Tobacco Use   Smoking status: Every Day    Packs/day: 0.25    Years: 60.00    Pack years: 15.00    Types: Cigarettes   Smokeless tobacco: Never  Vaping Use   Vaping Use: Never used  Substance and Sexual Activity   Alcohol use: No   Drug use: Yes    Types: Marijuana    Comment: 2-3 times a week- smoked last night 08/09/20   Sexual activity: Never  Other Topics Concern   Not on file  Social History Narrative   Lives in [place]. Smokes-1 pck in 3 days. No alcohol. Worked in [employer]. Son with her son.    Social Determinants of Health   Financial Resource Strain: Not on file  Food Insecurity: Not on file  Transportation Needs: Not on file  Physical Activity: Not on file  Stress: Not on file  Social Connections: Not on file  Intimate Partner Violence: Not on file    FAMILY HISTORY: Family History  Problem Relation Age of Onset   Osteoporosis Mother    Alzheimer's disease Mother    Colon cancer Daughter 69    ALLERGIES:  is allergic to codeine and metformin and related.  MEDICATIONS:  Current Outpatient Medications  Medication Sig Dispense Refill   albuterol (VENTOLIN HFA) 108 (90  Base) MCG/ACT inhaler Inhale 2 puffs into the lungs every 6 (six) hours as needed for wheezing or shortness of breath.     apixaban (ELIQUIS) 5 MG TABS tablet Take 1 tablet (5 mg total) by mouth 2 (two) times daily. 90 tablet 4   atorvastatin (LIPITOR) 80 MG tablet Take 80 mg by mouth daily.     cholecalciferol (VITAMIN D) 1000 units tablet Take 1,000 Units by mouth 2 (two) times daily.     clopidogrel (PLAVIX) 75 MG tablet Take 75 mg by mouth daily.     escitalopram (LEXAPRO) 10 MG tablet Take by mouth.     ezetimibe (ZETIA) 10 MG tablet Take 10 mg by mouth at bedtime.     Fluticasone-Salmeterol (ADVAIR) 250-50 MCG/DOSE AEPB Inhale 1 puff into the lungs daily.     lisinopril (ZESTRIL) 5 MG tablet Take 5 mg by mouth daily.     metoprolol tartrate (LOPRESSOR) 25 MG tablet Take 12.5 mg by mouth 2 (  two) times daily.     SPIRIVA HANDIHALER 18 MCG inhalation capsule 1 capsule daily.     aspirin EC 81 MG EC tablet Take 1 tablet (81 mg total) by mouth daily. (Patient not taking: Reported on 06/25/2021) 30 tablet 5   lidocaine-prilocaine (EMLA) cream Apply 30 -45 mins prior to port access. (Patient not taking: Reported on 10/16/2020) 30 g 0   nicotine (NICODERM CQ - DOSED IN MG/24 HOURS) 14 mg/24hr patch Place onto the skin. (Patient not taking: Reported on 05/18/2021)     nortriptyline (PAMELOR) 10 MG capsule Take 10 mg by mouth daily. (Patient not taking: Reported on 05/18/2021)     omeprazole (PRILOSEC) 20 MG capsule Take 20 mg by mouth daily. (Patient not taking: Reported on 05/18/2021)     oxyCODONE (OXY IR/ROXICODONE) 5 MG immediate release tablet Take 1 tablet (5 mg total) by mouth every 4 (four) hours as needed for severe pain. (Patient not taking: Reported on 12/21/2020) 45 tablet 0   pantoprazole (PROTONIX) 20 MG tablet Take 20 mg by mouth daily. (Patient not taking: Reported on 05/18/2021)     sertraline (ZOLOFT) 100 MG tablet Take 100 mg by mouth 2 (two) times daily.     No current  facility-administered medications for this visit.      Marland Kitchen  PHYSICAL EXAMINATION: ECOG PERFORMANCE STATUS: 1 - Symptomatic but completely ambulatory  Vitals:   06/25/21 1451  BP: (!) 122/50  Pulse: (!) 56  Temp: 98.4 F (36.9 C)  SpO2: 100%   Filed Weights   06/25/21 1451  Weight: 128 lb (58.1 kg)    Physical Exam Constitutional:      Comments: Alone.  Ambulating.   HENT:     Head: Normocephalic and atraumatic.     Mouth/Throat:     Pharynx: No oropharyngeal exudate.  Eyes:     Pupils: Pupils are equal, round, and reactive to light.  Cardiovascular:     Rate and Rhythm: Normal rate and regular rhythm.  Pulmonary:     Effort: No respiratory distress.     Breath sounds: No wheezing.     Comments: Decreased breath sounds at bases.  Abdominal:     General: Bowel sounds are normal. There is no distension.     Palpations: Abdomen is soft. There is no mass.     Tenderness: There is no abdominal tenderness. There is no guarding or rebound.  Musculoskeletal:        General: No tenderness. Normal range of motion.     Cervical back: Normal range of motion and neck supple.  Skin:    General: Skin is warm.  Neurological:     Mental Status: She is alert and oriented to person, place, and time.  Psychiatric:        Mood and Affect: Affect normal.     LABORATORY DATA:  I have reviewed the data as listed Lab Results  Component Value Date   WBC 6.3 05/18/2021   HGB 11.6 (L) 05/18/2021   HCT 34.6 (L) 05/18/2021   MCV 89.9 05/18/2021   PLT 219 05/18/2021   Recent Labs    10/30/20 1230 12/29/20 0949 02/05/21 1708 05/18/21 1249  NA 134* 134* 136 135  K 3.8 4.3 5.0 3.9  CL 102 103 102 104  CO2 24 21* 21* 24  GLUCOSE 124* 154* 218* 103*  BUN 20 15 18 14   CREATININE 1.00 0.83 1.00 0.85  CALCIUM 8.0* 8.6* 9.4 8.8*  GFRNONAA >60 >60 >60 >60  PROT  6.4* 7.2  --  7.1  ALBUMIN 3.1* 3.5  --  3.7  AST 22 18  --  15  ALT 18 11  --  11  ALKPHOS 58 73  --  69  BILITOT 0.3  0.3  --  0.6    RADIOGRAPHIC STUDIES: I have personally reviewed the radiological images as listed and agreed with the findings in the report. No results found.   ASSESSMENT & PLAN:   Cancer of anal canal (HCC) #Anal cancer squamous cell- [clinical T2N0 or T2N1] PET scan-? Slight uptake in inguinal LN. S/P- Chemo-RT [may 25th, 2022]; AUG 8th PET scan-minimal residual hypermetabolic tumor in the anal region but no evidence of any discrete mass.  No inguinal mass or adenopathy.   #Recommend evaluation with surgery/and if negative for any recurrent disease would recommend explantation before.  I have sent a message for Dr. Lysle Pearl  #Adrenal nodule [CT [facility] 2022-[facility]]-awaiting CT scan March 2023/[facility].   #COPD/lung nodules [stable; likely benign]-given active smoking I have counseled again that unfortunately she may progress from a COPD.  See below  # PVD- on eliquis-stable.  # Smoker active again recommended patient quit smoking; especially in the context of her COPD/CAD and risk for  lung and other malignancies.   # Mediport: recommend  Explantation- sent message to Dr.Sakai.   # DISPOSITION:  #Keep CT scan as planned in March; canceling the appointment in March # follow up in 6 month- MD; lab-s cbc/cmp--Dr.B   All questions were answered. The patient knows to call the clinic with any problems, questions or concerns.    Cammie Sickle, MD 06/25/2021 3:27 PM

## 2021-07-27 ENCOUNTER — Encounter: Payer: Self-pay | Admitting: *Deleted

## 2021-08-09 ENCOUNTER — Encounter: Payer: Self-pay | Admitting: Internal Medicine

## 2021-08-16 ENCOUNTER — Ambulatory Visit
Admission: RE | Admit: 2021-08-16 | Discharge: 2021-08-16 | Disposition: A | Discharge status: Home / Self Care | Payer: Medicare Other | Source: Ambulatory Visit | Attending: Nurse Practitioner | Admitting: Nurse Practitioner

## 2021-08-16 ENCOUNTER — Other Ambulatory Visit: Payer: Self-pay

## 2021-08-16 DIAGNOSIS — C211 Malignant neoplasm of anal canal: Secondary | ICD-10-CM | POA: Insufficient documentation

## 2021-08-20 ENCOUNTER — Inpatient Hospital Stay: Payer: Medicare Other | Attending: Internal Medicine

## 2021-08-20 ENCOUNTER — Other Ambulatory Visit: Payer: Medicare Other

## 2021-08-20 ENCOUNTER — Other Ambulatory Visit: Payer: Self-pay

## 2021-08-20 ENCOUNTER — Ambulatory Visit: Payer: Medicare Other | Admitting: Internal Medicine

## 2021-08-20 ENCOUNTER — Inpatient Hospital Stay (HOSPITAL_BASED_OUTPATIENT_CLINIC_OR_DEPARTMENT_OTHER): Payer: Medicare Other | Admitting: Internal Medicine

## 2021-08-20 DIAGNOSIS — C211 Malignant neoplasm of anal canal: Secondary | ICD-10-CM

## 2021-08-20 DIAGNOSIS — I2510 Atherosclerotic heart disease of native coronary artery without angina pectoris: Secondary | ICD-10-CM | POA: Diagnosis not present

## 2021-08-20 DIAGNOSIS — I739 Peripheral vascular disease, unspecified: Secondary | ICD-10-CM | POA: Insufficient documentation

## 2021-08-20 DIAGNOSIS — Z85048 Personal history of other malignant neoplasm of rectum, rectosigmoid junction, and anus: Secondary | ICD-10-CM | POA: Insufficient documentation

## 2021-08-20 DIAGNOSIS — F32A Depression, unspecified: Secondary | ICD-10-CM | POA: Insufficient documentation

## 2021-08-20 DIAGNOSIS — J449 Chronic obstructive pulmonary disease, unspecified: Secondary | ICD-10-CM | POA: Insufficient documentation

## 2021-08-20 DIAGNOSIS — Z7901 Long term (current) use of anticoagulants: Secondary | ICD-10-CM | POA: Diagnosis not present

## 2021-08-20 DIAGNOSIS — R918 Other nonspecific abnormal finding of lung field: Secondary | ICD-10-CM | POA: Insufficient documentation

## 2021-08-20 DIAGNOSIS — E279 Disorder of adrenal gland, unspecified: Secondary | ICD-10-CM | POA: Diagnosis not present

## 2021-08-20 DIAGNOSIS — Z79899 Other long term (current) drug therapy: Secondary | ICD-10-CM | POA: Diagnosis not present

## 2021-08-20 DIAGNOSIS — F17210 Nicotine dependence, cigarettes, uncomplicated: Secondary | ICD-10-CM | POA: Insufficient documentation

## 2021-08-20 LAB — CBC WITH DIFFERENTIAL/PLATELET
Abs Immature Granulocytes: 0.01 10*3/uL (ref 0.00–0.07)
Basophils Absolute: 0 10*3/uL (ref 0.0–0.1)
Basophils Relative: 0 %
Eosinophils Absolute: 0.1 10*3/uL (ref 0.0–0.5)
Eosinophils Relative: 2 %
HCT: 33 % — ABNORMAL LOW (ref 36.0–46.0)
Hemoglobin: 11 g/dL — ABNORMAL LOW (ref 12.0–15.0)
Immature Granulocytes: 0 %
Lymphocytes Relative: 33 %
Lymphs Abs: 2.1 10*3/uL (ref 0.7–4.0)
MCH: 31 pg (ref 26.0–34.0)
MCHC: 33.3 g/dL (ref 30.0–36.0)
MCV: 93 fL (ref 80.0–100.0)
Monocytes Absolute: 0.4 10*3/uL (ref 0.1–1.0)
Monocytes Relative: 6 %
Neutro Abs: 3.7 10*3/uL (ref 1.7–7.7)
Neutrophils Relative %: 59 %
Platelets: 203 10*3/uL (ref 150–400)
RBC: 3.55 MIL/uL — ABNORMAL LOW (ref 3.87–5.11)
RDW: 13.7 % (ref 11.5–15.5)
WBC: 6.3 10*3/uL (ref 4.0–10.5)
nRBC: 0 % (ref 0.0–0.2)

## 2021-08-20 LAB — COMPREHENSIVE METABOLIC PANEL
ALT: 13 U/L (ref 0–44)
AST: 15 U/L (ref 15–41)
Albumin: 3.7 g/dL (ref 3.5–5.0)
Alkaline Phosphatase: 71 U/L (ref 38–126)
Anion gap: 8 (ref 5–15)
BUN: 12 mg/dL (ref 8–23)
CO2: 24 mmol/L (ref 22–32)
Calcium: 8.7 mg/dL — ABNORMAL LOW (ref 8.9–10.3)
Chloride: 103 mmol/L (ref 98–111)
Creatinine, Ser: 0.88 mg/dL (ref 0.44–1.00)
GFR, Estimated: >60 mL/min (ref >60)
Glucose, Bld: 105 mg/dL — ABNORMAL HIGH (ref 70–99)
Potassium: 3.9 mmol/L (ref 3.5–5.1)
Sodium: 135 mmol/L (ref 135–145)
Total Bilirubin: 0.4 mg/dL (ref 0.3–1.2)
Total Protein: 7 g/dL (ref 6.5–8.1)

## 2021-08-20 MED ORDER — SODIUM CHLORIDE 0.9% FLUSH
10.0000 mL | Freq: Once | INTRAVENOUS | Status: AC
  Administered 2021-08-20: 10 mL via INTRAVENOUS
  Filled 2021-08-20: qty 10

## 2021-08-20 MED ORDER — SERTRALINE HCL 100 MG PO TABS: 100.0000 mg | ORAL_TABLET | Freq: Two times a day (BID) | ORAL | 0 refills | Status: AC

## 2021-08-20 MED ORDER — HEPARIN SOD (PORK) LOCK FLUSH 100 UNIT/ML IV SOLN
500.0000 [IU] | Freq: Once | INTRAVENOUS | Status: AC
  Administered 2021-08-20: 500 [IU] via INTRAVENOUS
  Filled 2021-08-20: qty 5

## 2021-08-20 NOTE — Progress Notes (Signed)
[place] NOTE  Patient Care Team: Letta Median, MD as PCP - General (Family Medicine) Clent Jacks, RN as Oncology Nurse Navigator Benjamine Sprague, DO as Consulting Physician (Surgery) Cammie Sickle, MD as Consulting Physician (Oncology) [name], MD as Consulting Physician (Radiation Oncology) Virgel Manifold, MD (Inactive) as Consulting Physician (Gastroenterology)  CHIEF COMPLAINTS/PURPOSE OF CONSULTATION: anal cancer  #  Oncology History Overview Note  # [facility] 2022- ANAL CA- EXCISIONAL BIOPSY:  - INVASIVE MODERATELY DIFFERENTIATED SQUAMOUS CELL CARCINOMA, PRESENT AT DEEP AND LATERAL TISSUE EDGES. [Dr.Sakai]  # PVD [s/p Stent; Dr.Dew]on Eliquis; active smoker;   # SURVIVORSHIP:   # GENETICS:   DIAGNOSIS:   STAGE:         ;  GOALS:  CURRENT/MOST RECENT THERAPY :     Cancer of anal canal ([place])  08/18/2020 Initial Diagnosis   Cancer of anal canal ([place])   09/29/2020 -  Chemotherapy    Patient is on Treatment Plan: ANUS MITOMYCIN D1,28 / 5FU D1-4, 28-31 Q32D         HISTORY OF PRESENTING ILLNESS: Alone.  Ambulating independently.  Mariah Rogers 71 y.o.  female history of COPD; anal cancer [stage II vs III] currently on chemoradiation is here for follow-up review results of the CT scan.  Patient did not follow-up with surgery as recommended because MI in the fall 2022.  Patient complains of pelvic pressure.  Complains of anal discharge.  Denies any blood in stools or black-colored stools.  Patient has multiple complaints including anxiety/depression.  Patient has been off Zoloft.  Requesting refill.  Patient denies any nausea vomiting.  Denies chest pain.  No difficulty breathing.    Review of Systems  Constitutional:  Negative for chills, diaphoresis, fever and weight loss.  HENT:  Negative for nosebleeds and sore throat.   Eyes:  Negative for double vision.  Respiratory:  Negative for cough, hemoptysis,  sputum production and wheezing.   Cardiovascular:  Negative for chest pain, palpitations, orthopnea and leg swelling.  Gastrointestinal:  Negative for abdominal pain, constipation, heartburn and melena.  Genitourinary:  Negative for dysuria, frequency and urgency.  Musculoskeletal:  Positive for back pain and joint pain.  Skin: Negative.  Negative for itching and rash.  Neurological:  Negative for dizziness, tingling, focal weakness, weakness and headaches.  Endo/Heme/Allergies:  Does not bruise/bleed easily.  Psychiatric/Behavioral:  Negative for depression. The patient is not nervous/anxious and does not have insomnia.     MEDICAL HISTORY:  Past Medical History:  Diagnosis Date   Arthritis    LEFT KNEE    CAD (coronary artery disease)    COPD (chronic obstructive pulmonary disease) (HCC)    Depression    Emphysema lung (HCC)    GERD (gastroesophageal reflux disease)    Hernia, hiatal    Hyperlipidemia    Hypertension    Peripheral vascular disease ([place])    Pre-diabetes    Thrombosis    in Lt leg    SURGICAL HISTORY: Past Surgical History:  Procedure Laterality Date   ABDOMINAL AORTA STENT     ABDOMINAL HYSTERECTOMY     aortobifemoral bypass  2005   CESAREAN SECTION     x 3   COLONOSCOPY WITH PROPOFOL N/A 03/10/2019   Procedure: COLONOSCOPY WITH PROPOFOL;  Surgeon: Virgel Manifold, MD;  Location: [facility] ENDOSCOPY;  Service: Endoscopy;  Laterality: N/A;   ESOPHAGOGASTRODUODENOSCOPY (EGD) WITH PROPOFOL N/A 03/10/2019   Procedure: ESOPHAGOGASTRODUODENOSCOPY (EGD) WITH PROPOFOL;  Surgeon: [name]  B, MD;  Location: [facility] ENDOSCOPY;  Service: Endoscopy;  Laterality: N/A;   EVALUATION UNDER ANESTHESIA WITH HEMORRHOIDECTOMY N/A 08/10/2020   Procedure: EXAM UNDER ANESTHESIA WITH HEMORRHOIDECTOMY;  Surgeon: Benjamine Sprague, DO;  Location: [facility] ORS;  Service: General;  Laterality: N/A;   femoral stents     LOWER EXTREMITY ANGIOGRAPHY Left 10/02/2016   Procedure: Lower  Extremity Angiography;  Surgeon: Algernon Huxley, MD;  Location: [facility];  Service: Cardiovascular;  Laterality: Left;   LOWER EXTREMITY ANGIOGRAPHY N/A 10/03/2016   Procedure: Lower Extremity Angiography with possible revascularization;  Surgeon: Algernon Huxley, MD;  Location: [facility];  Service: Cardiovascular;  Laterality: N/A;   PORTACATH PLACEMENT N/A 09/13/2020   Procedure: INSERTION PORT-A-CATH;  Surgeon: Benjamine Sprague, DO;  Location: [facility] ORS;  Service: General;  Laterality: N/A;   PORTACATH PLACEMENT Right 09/28/2020   Procedure: REVISION/REPOSITIONING OF PORT-A-CATH;  Surgeon: Benjamine Sprague, DO;  Location: [facility] ORS;  Service: General;  Laterality: Right;   VASCULAR SURGERY      SOCIAL HISTORY: Social History   Socioeconomic History   Marital status: Widowed    Spouse name: Not on file   Number of children: Not on file   Years of education: Not on file   Highest education level: Not on file  Occupational History   Not on file  Tobacco Use   Smoking status: Every Day    Packs/day: 0.25    Years: 60.00    Pack years: 15.00    Types: Cigarettes   Smokeless tobacco: Never  Vaping Use   Vaping Use: Never used  Substance and Sexual Activity   Alcohol use: No   Drug use: Yes    Types: Marijuana    Comment: 2-3 times a week- smoked last night 08/09/20   Sexual activity: Never  Other Topics Concern   Not on file  Social History Narrative   Lives in [place]. Smokes-1 pck in 3 days. No alcohol. Worked in [employer]. Son with her son.    Social Determinants of Health   Financial Resource Strain: Not on file  Food Insecurity: Not on file  Transportation Needs: Not on file  Physical Activity: Not on file  Stress: Not on file  Social Connections: Not on file  Intimate Partner Violence: Not on file    FAMILY HISTORY: Family History  Problem Relation Age of Onset   Osteoporosis Mother    Alzheimer's disease Mother    Colon cancer Daughter 98     ALLERGIES:  is allergic to codeine and metformin and related.  MEDICATIONS:  Current Outpatient Medications  Medication Sig Dispense Refill   albuterol (VENTOLIN HFA) 108 (90 Base) MCG/ACT inhaler Inhale 2 puffs into the lungs every 6 (six) hours as needed for wheezing or shortness of breath.     apixaban (ELIQUIS) 5 MG TABS tablet Take 1 tablet (5 mg total) by mouth 2 (two) times daily. 90 tablet 4   atorvastatin (LIPITOR) 80 MG tablet Take 80 mg by mouth daily.     cholecalciferol (VITAMIN D) 1000 units tablet Take 1,000 Units by mouth 2 (two) times daily.     clopidogrel (PLAVIX) 75 MG tablet Take 75 mg by mouth daily.     escitalopram (LEXAPRO) 10 MG tablet Take by mouth.     ezetimibe (ZETIA) 10 MG tablet Take 10 mg by mouth at bedtime.     Fluticasone-Salmeterol (ADVAIR) 250-50 MCG/DOSE AEPB Inhale 1 puff into the lungs daily.     lisinopril (ZESTRIL)  5 MG tablet Take 5 mg by mouth daily.     metoprolol tartrate (LOPRESSOR) 25 MG tablet Take 12.5 mg by mouth 2 (two) times daily.     nicotine (NICODERM CQ - DOSED IN MG/24 HOURS) 14 mg/24hr patch Place onto the skin.     SPIRIVA HANDIHALER 18 MCG inhalation capsule 1 capsule daily.     aspirin EC 81 MG EC tablet Take 1 tablet (81 mg total) by mouth daily. (Patient not taking: Reported on 06/25/2021) 30 tablet 5   lidocaine-prilocaine (EMLA) cream Apply 30 -45 mins prior to port access. (Patient not taking: Reported on 10/16/2020) 30 g 0   nortriptyline (PAMELOR) 10 MG capsule Take 10 mg by mouth daily. (Patient not taking: Reported on 05/18/2021)     omeprazole (PRILOSEC) 20 MG capsule Take 20 mg by mouth daily. (Patient not taking: Reported on 05/18/2021)     oxyCODONE (OXY IR/ROXICODONE) 5 MG immediate release tablet Take 1 tablet (5 mg total) by mouth every 4 (four) hours as needed for severe pain. (Patient not taking: Reported on 12/21/2020) 45 tablet 0   pantoprazole (PROTONIX) 20 MG tablet Take 20 mg by mouth daily. (Patient not taking:  Reported on 05/18/2021)     sertraline (ZOLOFT) 100 MG tablet Take 1 tablet (100 mg total) by mouth 2 (two) times daily. 60 tablet 0   No current facility-administered medications for this visit.      Marland Kitchen  PHYSICAL EXAMINATION: ECOG PERFORMANCE STATUS: 1 - Symptomatic but completely ambulatory  Vitals:   08/20/21 1430  BP: (!) 93/50  Pulse: (!) 59  Resp: 20  Temp: 98.7 F (37.1 C)  SpO2: 100%   Filed Weights   08/20/21 1430  Weight: 128 lb 3.2 oz (58.2 kg)    Physical Exam Constitutional:      Comments: Alone.  Ambulating.   HENT:     Head: Normocephalic and atraumatic.     Mouth/Throat:     Pharynx: No oropharyngeal exudate.  Eyes:     Pupils: Pupils are equal, round, and reactive to light.  Cardiovascular:     Rate and Rhythm: Normal rate and regular rhythm.  Pulmonary:     Effort: No respiratory distress.     Breath sounds: No wheezing.     Comments: Decreased breath sounds at bases.  Abdominal:     General: Bowel sounds are normal. There is no distension.     Palpations: Abdomen is soft. There is no mass.     Tenderness: There is no abdominal tenderness. There is no guarding or rebound.  Musculoskeletal:        General: No tenderness. Normal range of motion.     Cervical back: Normal range of motion and neck supple.  Skin:    General: Skin is warm.  Neurological:     Mental Status: She is alert and oriented to person, place, and time.  Psychiatric:        Mood and Affect: Affect normal.     LABORATORY DATA:  I have reviewed the data as listed Lab Results  Component Value Date   WBC 6.3 08/20/2021   HGB 11.0 (L) 08/20/2021   HCT 33.0 (L) 08/20/2021   MCV 93.0 08/20/2021   PLT 203 08/20/2021   Recent Labs    12/29/20 0949 02/05/21 1708 05/18/21 1249 08/20/21 1420  NA 134* 136 135 135  K 4.3 5.0 3.9 3.9  CL 103 102 104 103  CO2 21* 21* 24 24  GLUCOSE 154* 218*  103* 105*  BUN '15 18 14 12  '$ CREATININE 0.83 1.00 0.85 0.88  CALCIUM 8.6* 9.4  8.8* 8.7*  GFRNONAA >60 >60 >60 >60  PROT 7.2  --  7.1 7.0  ALBUMIN 3.5  --  3.7 3.7  AST 18  --  15 15  ALT 11  --  11 13  ALKPHOS 73  --  69 71  BILITOT 0.3  --  0.6 0.4    RADIOGRAPHIC STUDIES: I have personally reviewed the radiological images as listed and agreed with the findings in the report. CT ADRENAL ABD WO  Result Date: 08/18/2021 CLINICAL DATA:  71 year old female with history of anal cancer. Left adrenal lesion. EXAM: CT ABDOMEN WITHOUT CONTRAST TECHNIQUE: Multidetector CT imaging of the abdomen was performed following the standard protocol without IV contrast. RADIATION DOSE REDUCTION: This exam was performed according to the departmental dose-optimization program which includes automated exposure control, adjustment of the mA and/or kV according to patient size and/or use of iterative reconstruction technique. COMPARISON:  PET-CT 01/22/2021. FINDINGS: Lower chest: Large hiatal hernia. Atherosclerotic calcifications in the descending thoracic aorta as well as the left anterior descending, left circumflex and right coronary arteries. Hepatobiliary: No definite suspicious cystic or solid hepatic lesions are confidently identified on today's noncontrast CT examination. Unenhanced appearance of the gallbladder is normal. Pancreas: No definite pancreatic mass or peripancreatic fluid collections or inflammatory changes are noted on today's noncontrast CT examination. Spleen: Unremarkable. Adrenals/Urinary Tract: 3.1 cm exophytic low-attenuation lesion in the lower pole of the left kidney, incompletely characterized on today's non-contrast CT examination, but statistically likely to represent a cyst. Right kidney and right adrenal gland are normal in appearance. In the left adrenal gland there is a 2.4 x 1.5 cm low-attenuation (4 HU) nodule, compatible with a benign adenoma. No hydroureteronephrosis in the visualized portions of the abdomen. Stomach/Bowel: Intra-abdominal portion of the stomach  is normal. No pathologic dilatation of visualized portions of small bowel or colon. Vascular/Lymphatic: Postoperative changes of aortobifemoral bypass graft, incompletely imaged. Extensive aortic atherosclerosis. No lymphadenopathy noted in the abdomen. Other: No significant volume of ascites and no pneumoperitoneum noted in the visualized portions of the peritoneal cavity. Musculoskeletal: No aggressive appearing osseous lesions are noted in the visualized portions of the skeleton. IMPRESSION: 1. 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 2. Large hiatal hernia. 3. Aortic atherosclerosis, in addition to at least 3 vessel coronary artery disease. Assessment for potential risk factor modification, dietary therapy or pharmacologic therapy may be warranted, if clinically indicated. 4. Additional incidental findings, as above. Electronically Signed   By: Vinnie Langton M.D.   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 Slight uptake in inguinal LN. S/P- Chemo-RT [may 25th, 2022]; AUG 8th, 2022- PET scan-minimal residual hypermetabolic tumor in the anal region but no evidence of any discrete mass.  No inguinal mass or adenopathy.  Question local recurrence-see below  #Pelvic pressure/discharge-question hemorrhoidal.  Unlikely any recurrent disease soon after chemoradiation.  However patient would benefit from urgent evaluation with Surgery.  Informed Dr.Sakai.  And also patient was given Dr.Sakai's office number to contact.   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  No further work-up recommended.  #COPD/lung nodules [stable; likely benign]-given active smoking I have counseled again that unfortunately she may progress from a COPD.  See below  # Depression: recommend  restarting Zoloft.  Prescription given.  However patient declined that she follow-up with PCP for  further refills.  # PVD- on eliquis-stable.  # Smoker active again recommended patient quit smoking; especially in the context of her COPD/CAD and risk for  lung and other malignancies.   # Mediport: recommend  Explantation- sent message to Dr.Sakai-if no local evidence of disease.  #Incidental findings on Imaging August 16, 2021 CT scan-emphysema coronary arteriosclerosis I reviewed/discussed/counseled the patient.   # DISPOSITION:  # sakai office number # cancel July appt # follow up in 3 month- NP- lab-s cbc/cmp--Dr.B  # I reviewed the blood work- with the patient in detail; also reviewed the imaging independently [as summarized above]; and with the patient in detail.     All questions were answered. The patient knows to call the clinic with any problems, questions or concerns.    Cammie Sickle, MD 08/20/2021 7:51 PM

## 2021-08-20 NOTE — Patient Instructions (Signed)
#  Please call Dr.Sakai's office-for an appointment ASAP.  ?

## 2021-08-20 NOTE — Progress Notes (Signed)
Patient states she is having pain in anal area which affecting the vaginal area. Patient states it feels like pressure when sitting. Patient can't sit on bottom area. Patient states it burning and very uncomfortable. Patient states right arm hurts quiet a bit. Patient states it'll hurt to the point where she can't raise her arm up. Patient ran out of sertaline about a month ago. States she told her pcp and they "sent it in" but every time she goes to get its not there. She also states she ben trying to get in touch with pcp but no luck.  ? ?

## 2021-08-20 NOTE — Assessment & Plan Note (Addendum)
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 Slight uptake in inguinal LN. S/P- Chemo-RT [may 25th, 2022]; AUG 8th, 2022- PET scan-minimal residual hypermetabolic tumor in the anal region but no evidence of any discrete mass.  No inguinal mass or adenopathy.  Question local recurrence-see below ? ?#Pelvic pressure/discharge-question hemorrhoidal.  Unlikely any recurrent disease soon after chemoradiation.  However patient would benefit from urgent evaluation with Surgery.  Informed Dr.Sakai.  And also patient was given Dr.Sakai's office number to contact.  ? 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  No further work-up recommended. ? ?#COPD/lung nodules [stable; likely benign]-given active smoking I have counseled again that unfortunately she may progress from a COPD.  See below ? ?# Depression: recommend restarting Zoloft.  Prescription given.  However patient declined that she follow-up with PCP for further refills. ? ?# PVD- on eliquis-stable. ? ?# Smoker active again recommended patient quit smoking; especially in the context of her COPD/CAD and risk for  lung and other malignancies.  ? ?# Mediport: recommend  Explantation- sent message to Dr.Sakai-if no local evidence of disease. ? ?#Incidental findings on Imaging August 16, 2021 CT scan-emphysema coronary arteriosclerosis I reviewed/discussed/counseled the patient.  ? ?# DISPOSITION:  ?# sakai office number ?# cancel July appt ?# follow up in 3 month- NP- lab-s cbc/cmp--Dr.B ? ?# I reviewed the blood work- with the patient in detail; also reviewed the imaging independently [as summarized above]; and with the patient in detail.  ? ?

## 2021-08-27 ENCOUNTER — Other Ambulatory Visit: Payer: Self-pay | Admitting: Surgery

## 2021-08-27 DIAGNOSIS — C211 Malignant neoplasm of anal canal: Secondary | ICD-10-CM

## 2021-08-27 MED ORDER — TRAMADOL HCL 50 MG PO TABS
50.0000 mg | ORAL_TABLET | Freq: Four times a day (QID) | ORAL | 0 refills | Status: DC | PRN
Start: 2021-08-27 — End: 2021-09-25

## 2021-08-27 MED ORDER — LIDOCAINE 5 % EX OINT: 1.0000 "application " | TOPICAL_OINTMENT | CUTANEOUS | 0 refills | Status: AC | PRN

## 2021-09-26 ENCOUNTER — Other Ambulatory Visit
Admission: RE | Admit: 2021-09-26 | Discharge: 2021-09-26 | Disposition: A | Discharge status: Home / Self Care | Payer: Medicare Other | Source: Ambulatory Visit | Attending: Surgery | Admitting: Surgery

## 2021-09-26 ENCOUNTER — Ambulatory Visit: Payer: Self-pay | Admitting: Surgery

## 2021-09-26 HISTORY — DX: Malignant (primary) neoplasm, unspecified: C80.1

## 2021-09-26 HISTORY — DX: Type 2 diabetes mellitus without complications: E11.9

## 2021-09-26 HISTORY — DX: Cardiac arrhythmia, unspecified: I49.9

## 2021-09-26 HISTORY — DX: Dyspnea, unspecified: R06.00

## 2021-09-26 HISTORY — DX: Acute myocardial infarction, unspecified: I21.9

## 2021-09-26 NOTE — Patient Instructions (Addendum)
Your procedure is scheduled on: 09/28/21 - Friday ?Report to the Registration Desk on the 1st floor of the [facility]. ?To find out your arrival time, please call [phone] between 1PM - 3PM on: 09/27/21 - Thursday ? ?REMEMBER: ?Instructions that are not followed completely may result in serious medical risk, up to and including death; or upon the discretion of your surgeon and anesthesiologist your surgery may need to be rescheduled. ? ?Do not eat food after midnight the night before surgery.  ?No gum chewing, lozengers or hard candies. ? ?You may however, drink CLEAR liquids up to 2 hours before you are scheduled to arrive for your surgery. Do not drink anything within 2 hours of your scheduled arrival time. ? ?Clear liquids include: ?- water  ?- apple juice without pulp ?- gatorade (not RED colors) ?- black coffee or tea (Do NOT add milk or creamers to the coffee or tea) ?Do NOT drink anything that is not on this list. ? ?TAKE THESE MEDICATIONS THE MORNING OF SURGERY WITH A SIP OF WATER: ?- sertraline (ZOLOFT) 100 MG tablet ? ?Use albuterol (VENTOLIN HFA) 108 (90 Base) MCG/ACT inhaler on the day of surgery and bring to the hospital. ? ?Follow recommendations from Cardiologist, Pulmonologist or PCP regarding stopping Aspirin, Coumadin, Plavix, Eliquis, Pradaxa, or Pletal. Reports last does for Eliquis and Plavix was taken on 09/22/21, ? ?One week prior to surgery: ?Stop Anti-inflammatories (NSAIDS) such as Advil, Aleve, Ibuprofen, Motrin, Naproxen, Naprosyn and Aspirin based products such as Excedrin, Goodys Powder, BC Powder. ? ?Stop ANY OVER THE COUNTER supplements until after surgery. ? ?You may take Tylenol if needed for pain up until the day of surgery. ? ?No Alcohol for 24 hours before or after surgery. ? ?No Smoking including e-cigarettes for 24 hours prior to surgery.  ?No chewable tobacco products for at least 6 hours prior to surgery.  ?No nicotine patches on the day of surgery. ? ?Do not use any  "recreational" drugs for at least a week prior to your surgery.  ?Please be advised that the combination of cocaine and anesthesia may have negative outcomes, up to and including death. ?If you test positive for cocaine, your surgery will be cancelled. ? ?On the morning of surgery brush your teeth with toothpaste and water, you may rinse your mouth with mouthwash if you wish. ?Do not swallow any toothpaste or mouthwash. ? ?Do not wear jewelry, make-up, hairpins, clips or nail polish. ? ?Do not wear lotions, powders, or perfumes.  ? ?Do not shave body from the neck down 48 hours prior to surgery just in case you cut yourself which could leave a site for infection.  ?Also, freshly shaved skin may become irritated if using the CHG soap. ? ?Contact lenses, hearing aids and dentures may not be worn into surgery. ? ?Do not bring valuables to the hospital. [facility] is not responsible for any missing/lost belongings or valuables.  ? ?Notify your doctor if there is any change in your medical condition (cold, fever, infection). ? ?Wear comfortable clothing (specific to your surgery type) to the hospital. ? ?After surgery, you can help prevent lung complications by doing breathing exercises.  ?Take deep breaths and cough every 1-2 hours. Your doctor may order a device called an Incentive Spirometer to help you take deep breaths. ?When coughing or sneezing, hold a pillow firmly against your incision with both hands. This is called ?splinting.? Doing this helps protect your incision. It also decreases belly discomfort. ? ?If you  are being admitted to the hospital overnight, leave your suitcase in the car. ?After surgery it may be brought to your room. ? ?If you are being discharged the day of surgery, you will not be allowed to drive home. ?You will need a responsible adult (18 years or older) to drive you home and stay with you that night.  ? ?If you are taking public transportation, you will need to have a responsible adult  (18 years or older) with you. ?Please confirm with your physician that it is acceptable to use public transportation.  ? ?Please call the [facility] at [phone] if you have any questions about these instructions. ? ?Surgery Visitation Policy: ? ?Patients undergoing a surgery or procedure may have two family members or support persons with them as long as the person is not COVID-19 positive or experiencing its symptoms.  ? ?Inpatient Visitation:   ? ?Visiting hours are 7 a.m. to 8 p.m. ?Up to four visitors are allowed at one time in a patient room, including children. The visitors may rotate out with other people during the day. One designated support person (adult) may remain overnight.  ?

## 2021-09-26 NOTE — H&P (Signed)
Subjective:  ? 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 ?HPI: ?Mariah Rogers is a 71 y.o. female who is here for followup from above. Having some rectal pain recently, worse with hard stool, and also some scant blood on toilet paper.  ? ?Decreased appetite as well, but maintaining weight. If no longer indicated, will also like to discuss port removal. ? ?Current Medications: has a current medication list which includes the following prescription(s): apixaban, atorvastatin, clopidogrel, ezetimibe, fluticasone propion-salmeterol, lisinopril, metoprolol tartrate, proair hfa, spiriva with handihaler, and ezetimibe-simvastatin. ? ?Allergies:  ?Allergies  ?Allergen Reactions  ? Codeine Nausea And Vomiting  ? Metformin Diarrhea  ? ?ROS: ?General: Denies weight loss, weight gain, fatigue, fevers, chills, and night sweats. ?Heart: Denies chest pain, palpitations, racing heart, irregular heartbeat, leg pain or swelling, and decreased activity tolerance. ?Respiratory: Denies breathing difficulty, shortness of breath, wheezing, cough, and sputum. ?GI: Denies change in appetite, heartburn, nausea, vomiting, constipation, diarrhea, and blood in stool. ?GU: Denies difficulty urinating, pain with urinating, urgency, frequency, blood in urine ? ? ?Objective:  ? ? ?BP 133/77  Pulse 57  Ht 154.9 cm ('5\' 1"'$ )  Wt 65.8 kg (145 lb)  BMI 27.40 kg/m?  ? ?Constitutional : Alert, no distress, cooperative  ?Gastrointestinal: soft, non-tender; bowel sounds normal; no masses, no organomegaly.  ?Musculoskeletal: Steady gait and movement  ?Skin: Cool and moist.  ?Psychiatric: Normal affect, non-agitated, not confused  ?Rectal: Chaperone present for exam. External exam noted circumfrential thickened tissue, similar to post radiation tissue, with focal TTP along left anterior portion, where previous cancer was noted. No obvious lesion externally. DRE revealed, normal rectal tone, with moderate TTP along same are of tenderness in left anterior  portion. After obtaining verbal consent, an anoscope was gently inserted after prepped with lubricant and limited exam noted possible persistent ulcer like lesion in the are of previous biopsy proven cancer. No active bleeding and further examination aborted due to patient intolerance secondary to pain. No other obvious pathology such as fissures, fistulas, hemorrhoids, polyps noted. Scope withdrawn and patient tolerated procedure well. ? ? ?LABS:  ?N/A  ? ?RADS: ?N/A ? ?Assessment:  ? ? 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 ?Hx of MI on dual antiplatelet therapy ? ?Plan:  ? ? ?1. Areas of concern noted on limited bedside exam today. Recommended exam under anesthesia, possible repeat biopsy for further eval/survelliance of anal cancer. R/b/a discussed and patient agreeable to proceed once cards approves holding anticoag for procedure.  ? ?Discussed how if EUA is unremarkable, can proceed with removal of port during procedure.   ?

## 2021-09-26 NOTE — Pre-Procedure Instructions (Signed)
Progress Notes ?- documented in this encounter ?Caren Hazy, MD - 02/23/2021 10:10 AM EDT ?Formatting of this note is different from the original. ?Images from the original note were not included. ? ? ?DIVISION OF CARDIOLOGY ?[facility] [place]  ?Date of Service: 02/23/2021 ? ?PCP: Referring Provider:  ?Mliss Fritz, MD ?[address] ?[place] [address] ?Phone: [phone] ?Fax: [place], [place] ?[address] ?[id number] ?[place], [place] [address] ?Phone: [phone] ?Fax: [phone]  ? ?______________________________________________________________________________________________ ? ?ASSESSMENT AND PLAN:  ? ?NSTEMI - with biphasic Wellen's T waves s/p successful atherectomy to the mid LAD ?- she is on Plavix and Eliquis. She is chest pain free. ? ?ICM with EF of 45% - I have lowered her lisinopril from '20mg'$  to 5 mg, and restarted metoprolol 12.5 mg BID for her ACS and HF. ? ?HL - on lipitor 80 ? ?PVD - s/p aortobifemoral bypass - on Eliquis ? ?DM - on metformin ? ?Caren Hazy, MD, [facility] [place] ?Interventional Cardiology ?Associate Professor of Medicine ?[employer] [facility] ? ?______________________________________________________________________________________________ ? ?SUBJECTIVE:  ? ?HISTORY OF PRESENT ILLNESS: ? ?Dear Dr. Loni Muse, MD ?[address] ?[id number] ?[place], [place] [address], ? ?I had the pleasure of seeing Mariah Rogers in our adult general cardiology clinic today for follow up visit regarding her recent cardiac cath referred by Dr. Vivianne Spence, MD. She is a 71 y.o. year-old patient with a history of cancer, COPD, diabetes, GERD, HLD and HTN. She also has has an aortobifemoral bypass in which she was started on Eliquis. She was recently admitted to [facility] ED in August 2022 for persistent nausea - with an EKG showing biphasic T waves suggestive of proximal LAD disease.  Cardiac catheterization revealed a 99% mid LAD that underwent atherectomy the next day by Dr. Ignacia Bayley ? ?Ms Lerch reports feeling significantly better. She says her nausea has completely resolved. She is moving around and eating normally again. She says her BP was low when visiting her PCP and she has stopped taking her metoprolol. We discussed decreasing her lisinopril to 5 mg/day and increasing her metoprolol to 12.5 mg 2x/day.  ? ?She reports no lightheadedness, dizziness, palpitations, or syncope. Patient also denies any overt heart failure symptoms such as orthopnea, paroxysmal nocturnal dyspnea, or worsening lower extremity edema.  ? ?Mariah Rogers states compliance with her medications and denies any untoward side effects from it.  ? ?CARDIOVASCULAR HISTORY AND PROCEDURES ? ?Cardiovascular Studies Date Comments ? ?ECG 2022 NSR  ?Echo 2022 EF of 40-45%  ?Stress test 2021  ?Cardiac catheterization 2022 PCI with atherectomy to the LAD  ?Electrophysiology  ?Cardiovascular Surgery  ?Peripheral Vascular Studies  ? ?I have reviewed the cardiology tests personally.  ? ?PAST MEDICAL HISTORY ?Past Medical History:  ?Diagnosis Date  ? Cancer (CMS-HCC)  ? Chronic kidney disease  ? COPD (chronic obstructive pulmonary disease) (CMS-HCC)  ? Diabetes mellitus (CMS-HCC)  ? GERD (gastroesophageal reflux disease)  ? Hyperlipidemia  ? Hypertension  ? Osteoarthritis  ? ?ALLERGIES ?Codeine ? ?CURRENT MEDICATIONS ?Current Outpatient Medications  ?Medication Sig Dispense Refill  ? albuterol HFA 90 mcg/actuation inhaler Inhale 2 puffs every six (6) hours as needed.  ? atorvastatin (LIPITOR) 80 MG tablet Take 1 tablet (80 mg total) by mouth daily. 30 tablet 0  ? cholecalciferol, vitamin D3, 1,000 unit (25 mcg) tablet Take 1,000 Units by mouth Two (2) times a day.  ? clopidogreL (PLAVIX)  75 mg tablet Take 1 tablet (75 mg total) by mouth daily. 30 tablet 0  ? ELIQUIS 5 mg Tab Take 5 mg by mouth Two (2) times a day.  ? ezetimibe  (ZETIA) 10 mg tablet Take 1 tablet (10 mg total) by mouth nightly. 30 tablet 0  ? fluticasone propion-salmeteroL (ADVAIR) 250-50 mcg/dose diskus Inhale 1 puff in the morning.  ? lisinopriL (PRINIVIL,ZESTRIL) 20 MG tablet Take 20 mg by mouth daily.  ? metFORMIN (GLUCOPHAGE) 500 MG tablet Take 500 mg by mouth daily.  ? nicotine (NICODERM CQ) 14 mg/24 hr patch Place 1 patch on the skin daily. 28 patch 0  ? nicotine polacrilex (NICORETTE MINI) lozenge 2 mg Apply 1 lozenge (2 mg total) to cheek every hour as needed for smoking cessation. 108 lozenge 0  ? nicotine polacrilex (NICORETTE) 2 mg gum Apply 1 each (2 mg total) to cheek every hour as needed for smoking cessation. 110 each 0  ? nortriptyline (PAMELOR) 10 MG capsule Take 10 mg by mouth daily.  ? pantoprazole (PROTONIX) 20 MG tablet Take 1 tablet (20 mg total) by mouth daily. 30 tablet 0  ? sertraline (ZOLOFT) 100 MG tablet Take 100 mg by mouth daily. Take 2 tablets daily  ? tiotropium (SPIRIVA) 18 mcg inhalation capsule Place 18 mcg into inhaler and inhale daily.  ? ?No current facility-administered medications for this visit.  ? ?FAMILY HISTORY ?Negative for early CAD ? ?SOCIAL HISTORY ?She reports that she has been smoking cigarettes. She has a 10.00 pack-year smoking history. She has never used smokeless tobacco. She reports previous alcohol use. She reports current drug use. Frequency: 3.00 times per week. Drug: Marijuana. ? ?REVIEW OF SYSTEMS ? ?Review of Systems - 10 systems were reviewed and negative except as noted in HPI ? ?Constitutional: negative for - chills, fatigue, fever or night sweats ?ENT ROS: negative for - vertigo or visual changes ?Hematological and Lymphatic ROS: negative for - blood transfusions, jaundice, night sweats or swollen lymph nodes ?Endocrine ROS: negative for - skin changes or temperature intolerance ?Respiratory ROS: no cough, shortness of breath, or wheezing negative for - hemoptysis, orthopnea, shortness of breath, tachypnea or  wheezing ?Cardiovascular ROS: As reported in HPI ?Gastrointestinal ROS: negative for - abdominal pain, hematemesis, melena, nausea/vomiting or swallowing difficulty/pain ?Genito-Urinary ROS: negative for - dysuria, incontinence or nocturia ?Musculoskeletal ROS: negative for - gait disturbance, joint pain, muscle pain or muscular weakness ?Neurological ROS: negative for - behavioral changes, bowel and bladder control changes, headaches, seizures or speech problems ? ?PHYSICAL EXAM ? ?Physical Exam ?LMP (LMP Unknown)  ?Wt Readings from Last 3 Encounters:  ?02/09/21 57 kg (125 lb 10.6 oz)  ?01/11/19 68.1 kg (150 lb 1.6 oz)  ?12/09/08 69 kg (152 lb 3.2 oz)  ? ?General: Alert, no distress.  ?Eyes: Intact, sclerae anicteric.  ?Ears, nose, mouth: Moist mucous membranes.Supple, no carotid bruit.  ?Respiratory: CTAB bilaterally with normal WOB.  ?Cardiovascular: No carotid bruit, no JVD, RRR no murmurs rubs or gallops  ?Gastrointestinal: Normal bowel sounds, soft, NTND.  ?Musculoskeletal: Normal strength  ?Skin: Warm, well perfused.  ?Neurologic: No focal deficits.  ? ?Most recent labs  ?Lab Results  ?Component Value Date  ?Sodium 134 (L) 02/09/2021  ?Potassium 3.9 02/09/2021  ?Chloride 105 02/09/2021  ?CO2 24.0 02/09/2021  ?BUN 18 02/09/2021  ?Creatinine 0.89 (H) 02/09/2021  ?Magnesium 1.8 02/09/2021  ? ?Lab Results  ?Component Value Date  ?HGB 12.0 02/09/2021  ?MCV 83.8 02/09/2021  ?Platelet 126 (L) 02/09/2021  ? ?Lab  Results  ?Component Value Date  ?Cholesterol 176 02/07/2021  ?Triglycerides 425 (H) 02/07/2021  ?HDL 26 (L) 02/07/2021  ?Non-HDL Cholesterol 150 (H) 02/07/2021  ?LDL Calculated 02/07/2021  ?Comment:  ?NHLBI Recommended Ranges, LDL Cholesterol, for Adults (20+yrs) (ATPIII), mg/dL ?Optimal <100 ?Near Optimal 100-129 ?Borderline High 130-159 ?High 160-189 ?Very High >=190 ?NHLBI Recommended Ranges, LDL Cholesterol, for Children (2-19 yrs), mg/dL ?Desirable <110 ?Borderline High 110-129 ?High >=130 ? ?Unable to  calculate due to triglyceride greater than 400 mg/dL.  ?LDL Direct 90.3 02/08/2021  ?Hemoglobin A1C 6.1 (H) 02/07/2021  ?TSH 3.594 02/07/2021  ?INR 0.97 02/09/2021  ? ?Lab Results  ?Component Value Date

## 2021-09-26 NOTE — H&P (View-Only) (Signed)
Subjective:  ? 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 ?HPI: ?Mariah Rogers is a 71 y.o. female who is here for followup from above. Having some rectal pain recently, worse with hard stool, and also some scant blood on toilet paper.  ? ?Decreased appetite as well, but maintaining weight. If no longer indicated, will also like to discuss port removal. ? ?Current Medications: has a current medication list which includes the following prescription(s): apixaban, atorvastatin, clopidogrel, ezetimibe, fluticasone propion-salmeterol, lisinopril, metoprolol tartrate, proair hfa, spiriva with handihaler, and ezetimibe-simvastatin. ? ?Allergies:  ?Allergies  ?Allergen Reactions  ? Codeine Nausea And Vomiting  ? Metformin Diarrhea  ? ?ROS: ?General: Denies weight loss, weight gain, fatigue, fevers, chills, and night sweats. ?Heart: Denies chest pain, palpitations, racing heart, irregular heartbeat, leg pain or swelling, and decreased activity tolerance. ?Respiratory: Denies breathing difficulty, shortness of breath, wheezing, cough, and sputum. ?GI: Denies change in appetite, heartburn, nausea, vomiting, constipation, diarrhea, and blood in stool. ?GU: Denies difficulty urinating, pain with urinating, urgency, frequency, blood in urine ? ? ?Objective:  ? ? ?BP 133/77  Pulse 57  Ht 154.9 cm ('5\' 1"'$ )  Wt 65.8 kg (145 lb)  BMI 27.40 kg/m?  ? ?Constitutional : Alert, no distress, cooperative  ?Gastrointestinal: soft, non-tender; bowel sounds normal; no masses, no organomegaly.  ?Musculoskeletal: Steady gait and movement  ?Skin: Cool and moist.  ?Psychiatric: Normal affect, non-agitated, not confused  ?Rectal: Chaperone present for exam. External exam noted circumfrential thickened tissue, similar to post radiation tissue, with focal TTP along left anterior portion, where previous cancer was noted. No obvious lesion externally. DRE revealed, normal rectal tone, with moderate TTP along same are of tenderness in left anterior  portion. After obtaining verbal consent, an anoscope was gently inserted after prepped with lubricant and limited exam noted possible persistent ulcer like lesion in the are of previous biopsy proven cancer. No active bleeding and further examination aborted due to patient intolerance secondary to pain. No other obvious pathology such as fissures, fistulas, hemorrhoids, polyps noted. Scope withdrawn and patient tolerated procedure well. ? ? ?LABS:  ?N/A  ? ?RADS: ?N/A ? ?Assessment:  ? ? 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 ?Hx of MI on dual antiplatelet therapy ? ?Plan:  ? ? ?1. Areas of concern noted on limited bedside exam today. Recommended exam under anesthesia, possible repeat biopsy for further eval/survelliance of anal cancer. R/b/a discussed and patient agreeable to proceed once cards approves holding anticoag for procedure.  ? ?Discussed how if EUA is unremarkable, can proceed with removal of port during procedure.   ?

## 2021-09-27 ENCOUNTER — Other Ambulatory Visit
Admission: RE | Admit: 2021-09-27 | Discharge: 2021-09-27 | Disposition: A | Discharge status: Home / Self Care | Payer: Medicare Other | Source: Ambulatory Visit | Attending: Surgery | Admitting: Surgery

## 2021-09-27 DIAGNOSIS — Z01810 Encounter for preprocedural cardiovascular examination: Secondary | ICD-10-CM | POA: Insufficient documentation

## 2021-09-27 MED ORDER — ORAL CARE MOUTH RINSE: 15.0000 mL | Freq: Once | OROMUCOSAL | Status: AC

## 2021-09-27 MED ORDER — CEFAZOLIN SODIUM-DEXTROSE 2-4 GM/100ML-% IV SOLN
2.0000 g | INTRAVENOUS | Status: AC
  Administered 2021-09-28: 2 g via INTRAVENOUS

## 2021-09-27 MED ORDER — FAMOTIDINE 20 MG PO TABS: 20.0000 mg | ORAL_TABLET | Freq: Once | ORAL | Status: AC

## 2021-09-27 MED ORDER — LACTATED RINGERS IV SOLN: INTRAVENOUS | Status: DC

## 2021-09-27 MED ORDER — CHLORHEXIDINE GLUCONATE 0.12 % MT SOLN: 15.0000 mL | Freq: Once | OROMUCOSAL | Status: AC

## 2021-09-27 MED ORDER — CHLORHEXIDINE GLUCONATE CLOTH 2 % EX PADS: 6.0000 | MEDICATED_PAD | Freq: Once | CUTANEOUS | Status: DC

## 2021-09-28 ENCOUNTER — Ambulatory Visit
Admission: RE | Admit: 2021-09-28 | Discharge: 2021-09-28 | Disposition: A | Discharge status: Home / Self Care | Payer: Medicare Other | Attending: Surgery | Admitting: Surgery

## 2021-09-28 ENCOUNTER — Encounter: Payer: Self-pay | Admitting: Surgery

## 2021-09-28 ENCOUNTER — Other Ambulatory Visit: Payer: Self-pay

## 2021-09-28 ENCOUNTER — Ambulatory Visit: Payer: Medicare Other | Admitting: Anesthesiology

## 2021-09-28 ENCOUNTER — Encounter
Admission: RE | Disposition: A | Discharge status: Home / Self Care | Payer: Self-pay | Source: Home / Self Care | Attending: Surgery

## 2021-09-28 DIAGNOSIS — Z955 Presence of coronary angioplasty implant and graft: Secondary | ICD-10-CM | POA: Diagnosis not present

## 2021-09-28 DIAGNOSIS — I252 Old myocardial infarction: Secondary | ICD-10-CM | POA: Diagnosis not present

## 2021-09-28 DIAGNOSIS — I739 Peripheral vascular disease, unspecified: Secondary | ICD-10-CM | POA: Diagnosis not present

## 2021-09-28 DIAGNOSIS — I779 Disorder of arteries and arterioles, unspecified: Secondary | ICD-10-CM | POA: Diagnosis not present

## 2021-09-28 DIAGNOSIS — Z86718 Personal history of other venous thrombosis and embolism: Secondary | ICD-10-CM | POA: Diagnosis not present

## 2021-09-28 DIAGNOSIS — F17200 Nicotine dependence, unspecified, uncomplicated: Secondary | ICD-10-CM | POA: Diagnosis not present

## 2021-09-28 DIAGNOSIS — I10 Essential (primary) hypertension: Secondary | ICD-10-CM | POA: Insufficient documentation

## 2021-09-28 DIAGNOSIS — Z95828 Presence of other vascular implants and grafts: Secondary | ICD-10-CM | POA: Diagnosis not present

## 2021-09-28 DIAGNOSIS — C210 Malignant neoplasm of anus, unspecified: Secondary | ICD-10-CM | POA: Insufficient documentation

## 2021-09-28 DIAGNOSIS — J449 Chronic obstructive pulmonary disease, unspecified: Secondary | ICD-10-CM | POA: Diagnosis not present

## 2021-09-28 DIAGNOSIS — I6529 Occlusion and stenosis of unspecified carotid artery: Secondary | ICD-10-CM | POA: Diagnosis not present

## 2021-09-28 DIAGNOSIS — I2510 Atherosclerotic heart disease of native coronary artery without angina pectoris: Secondary | ICD-10-CM | POA: Diagnosis not present

## 2021-09-28 DIAGNOSIS — C211 Malignant neoplasm of anal canal: Secondary | ICD-10-CM

## 2021-09-28 DIAGNOSIS — R7303 Prediabetes: Secondary | ICD-10-CM | POA: Insufficient documentation

## 2021-09-28 HISTORY — PX: RECTAL EXAM UNDER ANESTHESIA: SHX6399

## 2021-09-28 LAB — GLUCOSE, CAPILLARY
Glucose-Capillary: 121 mg/dL — ABNORMAL HIGH (ref 70–99)
Glucose-Capillary: 172 mg/dL — ABNORMAL HIGH (ref 70–99)

## 2021-09-28 SURGERY — EXAM UNDER ANESTHESIA, RECTUM
Anesthesia: General | Site: Rectum

## 2021-09-28 MED ORDER — OXYCODONE HCL 5 MG/5ML PO SOLN: 5.0000 mg | Freq: Once | ORAL | Status: DC | PRN

## 2021-09-28 MED ORDER — PHENYLEPHRINE 40 MCG/ML (10ML) SYRINGE FOR IV PUSH (FOR BLOOD PRESSURE SUPPORT)
PREFILLED_SYRINGE | INTRAVENOUS | Status: DC | PRN
  Administered 2021-09-28: 80 ug via INTRAVENOUS

## 2021-09-28 MED ORDER — ACETAMINOPHEN 10 MG/ML IV SOLN
INTRAVENOUS | Status: DC | PRN
  Administered 2021-09-28: 1000 mg via INTRAVENOUS

## 2021-09-28 MED ORDER — SUCCINYLCHOLINE CHLORIDE 200 MG/10ML IV SOSY
PREFILLED_SYRINGE | INTRAVENOUS | Status: DC | PRN
  Administered 2021-09-28: 80 mg via INTRAVENOUS

## 2021-09-28 MED ORDER — ACETAMINOPHEN 10 MG/ML IV SOLN: 1000.0000 mg | Freq: Once | INTRAVENOUS | Status: DC | PRN

## 2021-09-28 MED ORDER — DEXAMETHASONE SODIUM PHOSPHATE 10 MG/ML IJ SOLN
INTRAMUSCULAR | Status: DC | PRN
  Administered 2021-09-28: 10 mg via INTRAVENOUS

## 2021-09-28 MED ORDER — CHLORHEXIDINE GLUCONATE 0.12 % MT SOLN
OROMUCOSAL | Status: AC
  Administered 2021-09-28: 15 mL via OROMUCOSAL
  Filled 2021-09-28: qty 15

## 2021-09-28 MED ORDER — ONDANSETRON HCL 4 MG/2ML IJ SOLN
INTRAMUSCULAR | Status: DC | PRN
  Administered 2021-09-28: 4 mg via INTRAVENOUS

## 2021-09-28 MED ORDER — DOCUSATE SODIUM 100 MG PO CAPS: 100.0000 mg | ORAL_CAPSULE | Freq: Two times a day (BID) | ORAL | 0 refills | Status: AC | PRN

## 2021-09-28 MED ORDER — ROCURONIUM BROMIDE 100 MG/10ML IV SOLN
INTRAVENOUS | Status: DC | PRN
  Administered 2021-09-28: 30 mg via INTRAVENOUS

## 2021-09-28 MED ORDER — ACETAMINOPHEN 325 MG PO TABS: 650.0000 mg | ORAL_TABLET | Freq: Three times a day (TID) | ORAL | 0 refills | Status: DC | PRN

## 2021-09-28 MED ORDER — GLYCOPYRROLATE 0.2 MG/ML IJ SOLN
INTRAMUSCULAR | Status: DC | PRN
  Administered 2021-09-28: .2 mg via INTRAVENOUS

## 2021-09-28 MED ORDER — PROMETHAZINE HCL 25 MG/ML IJ SOLN: 6.2500 mg | INTRAMUSCULAR | Status: DC | PRN

## 2021-09-28 MED ORDER — TRAMADOL HCL 50 MG PO TABS
50.0000 mg | ORAL_TABLET | Freq: Four times a day (QID) | ORAL | 0 refills | Status: DC | PRN
Start: 2021-09-28 — End: 2021-10-23

## 2021-09-28 MED ORDER — HYDROCODONE-ACETAMINOPHEN 5-325 MG PO TABS: 1.0000 | ORAL_TABLET | Freq: Four times a day (QID) | ORAL | 0 refills | Status: DC | PRN

## 2021-09-28 MED ORDER — LIDOCAINE HCL (CARDIAC) PF 100 MG/5ML IV SOSY
PREFILLED_SYRINGE | INTRAVENOUS | Status: DC | PRN
  Administered 2021-09-28: 100 mg via INTRAVENOUS

## 2021-09-28 MED ORDER — FENTANYL CITRATE (PF) 100 MCG/2ML IJ SOLN
INTRAMUSCULAR | Status: DC | PRN
  Administered 2021-09-28 (×2): 25 ug via INTRAVENOUS

## 2021-09-28 MED ORDER — FAMOTIDINE 20 MG PO TABS
ORAL_TABLET | ORAL | Status: AC
  Administered 2021-09-28: 20 mg via ORAL
  Filled 2021-09-28: qty 1

## 2021-09-28 MED ORDER — OXYCODONE HCL 5 MG PO TABS: 5.0000 mg | ORAL_TABLET | Freq: Once | ORAL | Status: DC | PRN

## 2021-09-28 MED ORDER — ACETAMINOPHEN 10 MG/ML IV SOLN
INTRAVENOUS | Status: AC
  Filled 2021-09-28: qty 100

## 2021-09-28 MED ORDER — CEFAZOLIN SODIUM-DEXTROSE 2-4 GM/100ML-% IV SOLN
INTRAVENOUS | Status: AC
  Filled 2021-09-28: qty 100

## 2021-09-28 MED ORDER — DEXMEDETOMIDINE HCL IN NACL 200 MCG/50ML IV SOLN
INTRAVENOUS | Status: DC | PRN
  Administered 2021-09-28: 4 ug via INTRAVENOUS

## 2021-09-28 MED ORDER — BUPIVACAINE-EPINEPHRINE (PF) 0.5% -1:200000 IJ SOLN
INTRAMUSCULAR | Status: AC
Start: 2021-09-28 — End: ?
  Filled 2021-09-28: qty 30

## 2021-09-28 MED ORDER — VASOPRESSIN 20 UNIT/ML IV SOLN
INTRAVENOUS | Status: DC | PRN
  Administered 2021-09-28: 2 [IU] via INTRAVENOUS

## 2021-09-28 MED ORDER — BUPIVACAINE LIPOSOME 1.3 % IJ SUSP
INTRAMUSCULAR | Status: AC
  Filled 2021-09-28: qty 20

## 2021-09-28 MED ORDER — SUGAMMADEX SODIUM 200 MG/2ML IV SOLN
INTRAVENOUS | Status: DC | PRN
  Administered 2021-09-28: 200 mg via INTRAVENOUS

## 2021-09-28 MED ORDER — BUPIVACAINE-EPINEPHRINE (PF) 0.5% -1:200000 IJ SOLN
INTRAMUSCULAR | Status: DC | PRN
Start: 2021-09-28 — End: 2021-09-28
  Administered 2021-09-28: 5 mL

## 2021-09-28 MED ORDER — FENTANYL CITRATE (PF) 100 MCG/2ML IJ SOLN: 25.0000 ug | INTRAMUSCULAR | Status: DC | PRN

## 2021-09-28 MED ORDER — FENTANYL CITRATE (PF) 100 MCG/2ML IJ SOLN
INTRAMUSCULAR | Status: AC
  Filled 2021-09-28: qty 2

## 2021-09-28 SURGICAL SUPPLY — 34 items
BLADE SURG 15 STRL LF DISP TIS (BLADE) ×1 IMPLANT
BLADE SURG 15 STRL SS (BLADE) ×1
DRAPE PERI LITHO V/GYN (MISCELLANEOUS) ×2 IMPLANT
DRAPE UNDER BUTTOCK W/FLU (DRAPES) ×2 IMPLANT
DRSG GAUZE FLUFF 36X18 (GAUZE/BANDAGES/DRESSINGS) ×2 IMPLANT
ELECT REM PT RETURN 9FT ADLT (ELECTROSURGICAL) ×2
ELECTRODE REM PT RTRN 9FT ADLT (ELECTROSURGICAL) ×1 IMPLANT
GAUZE 4X4 16PLY ~~LOC~~+RFID DBL (SPONGE) ×2 IMPLANT
GLOVE BIOGEL PI IND STRL 7.0 (GLOVE) ×1 IMPLANT
GLOVE BIOGEL PI INDICATOR 7.0 (GLOVE) ×1
GLOVE SURG SYN 6.5 ES PF (GLOVE) ×2 IMPLANT
GLOVE SURG SYN 6.5 PF PI (GLOVE) ×1 IMPLANT
GOWN STRL REUS W/ TWL LRG LVL3 (GOWN DISPOSABLE) ×2 IMPLANT
GOWN STRL REUS W/TWL LRG LVL3 (GOWN DISPOSABLE) ×2
KIT TURNOVER CYSTO (KITS) ×2 IMPLANT
LABEL OR SOLS (LABEL) ×2 IMPLANT
MANIFOLD NEPTUNE II (INSTRUMENTS) ×2 IMPLANT
NDL HYPO 25X1 1.5 SAFETY (NEEDLE) ×1 IMPLANT
NEEDLE HYPO 22GX1.5 SAFETY (NEEDLE) ×2 IMPLANT
NEEDLE HYPO 25X1 1.5 SAFETY (NEEDLE) ×2 IMPLANT
NS IRRIG 500ML POUR BTL (IV SOLUTION) ×2 IMPLANT
PACK BASIN MINOR ARMC (MISCELLANEOUS) ×2 IMPLANT
PAD PREP 24X41 OB/GYN DISP (PERSONAL CARE ITEMS) ×2 IMPLANT
PANTS MESH DISP 2XL (UNDERPADS AND DIAPERS) ×1 IMPLANT
PANTS MESH DISPOSABLE 2XL (UNDERPADS AND DIAPERS) ×1
SHEARS HARMONIC 9CM CVD (BLADE) IMPLANT
SOL PREP PVP 2OZ (MISCELLANEOUS) ×2
SOLUTION PREP PVP 2OZ (MISCELLANEOUS) ×1 IMPLANT
SURGILUBE 2OZ TUBE FLIPTOP (MISCELLANEOUS) ×2 IMPLANT
SUT VIC AB 3-0 SH 27 (SUTURE) ×1
SUT VIC AB 3-0 SH 27X BRD (SUTURE) ×1 IMPLANT
SYR 10ML LL (SYRINGE) ×2 IMPLANT
SYR 20ML LL LF (SYRINGE) ×2 IMPLANT
WATER STERILE IRR 500ML POUR (IV SOLUTION) ×2 IMPLANT

## 2021-09-28 NOTE — Anesthesia Postprocedure Evaluation (Signed)
Anesthesia Post Note ? ?Patient: Mariah Rogers ? ?Procedure(s) Performed: RECTAL EXAM UNDER ANESTHESIA (Rectum) ? ?Patient location during evaluation: PACU ?Anesthesia Type: General ?Level of consciousness: awake and alert ?Pain management: pain level controlled ?Vital Signs Assessment: post-procedure vital signs reviewed and stable ?Respiratory status: spontaneous breathing, nonlabored ventilation and respiratory function stable ?Cardiovascular status: blood pressure returned to baseline and stable ?Postop Assessment: no apparent nausea or vomiting ?Anesthetic complications: no ? ? ?No notable events documented. ? ? ?Last Vitals:  ?Vitals:  ? 09/28/21 1021 09/28/21 1032  ?BP: (!) 130/58 (!) 142/55  ?Pulse: 66 64  ?Resp: (!) 22 16  ?Temp: 37.2 ?C 36.6 ?C  ?SpO2: 95% 97%  ?  ?Last Pain:  ?Vitals:  ? 09/28/21 1032  ?TempSrc: Oral  ?PainSc: 0-No pain  ? ? ?  ?  ?  ?  ?  ?  ? ?[place] Ouch ? ? ? ? ?

## 2021-09-28 NOTE — Anesthesia Procedure Notes (Signed)
Procedure Name: Intubation ?Date/Time: 09/28/2021 9:06 AM ?Performed by: Kelton Pillar, CRNA ?Pre-anesthesia Checklist: Patient identified, Emergency Drugs available, Suction available and Patient being monitored ?Patient Re-evaluated:Patient Re-evaluated prior to induction ?Oxygen Delivery Method: Circle system utilized ?Preoxygenation: Pre-oxygenation with 100% oxygen ?Induction Type: IV induction ?Ventilation: Mask ventilation without difficulty ?Laryngoscope Size: McGraph and 3 ?Grade View: Grade I ?Tube type: Oral ?Tube size: 6.5 mm ?Number of attempts: 1 ?Airway Equipment and Method: Stylet and Oral airway ?Placement Confirmation: ETT inserted through vocal cords under direct vision, positive ETCO2, breath sounds checked- equal and bilateral and CO2 detector ?Secured at: 21 cm ?Tube secured with: Tape ?Dental Injury: Teeth and Oropharynx as per pre-operative assessment  ? ? ? ? ?

## 2021-09-28 NOTE — Discharge Instructions (Addendum)
? ?AMBULATORY SURGERY  ?DISCHARGE INSTRUCTIONS ? ?Removal, Care After ?This sheet gives you information about how to care for yourself after your procedure. Your health care provider may also give you more specific instructions. If you have problems or questions, contact your health care provider. ?What can I expect after the procedure? ?After the procedure, it is common to have: ?Soreness. ?Bruising. ?Itching. ?Follow these instructions at home: ?site care ?Follow instructions from your health care provider about how to take care of your site. Make sure you: ?Wash your hands with soap and water before and after you change your bandage (dressing). If soap and water are not available, use hand sanitizer. ?Leave stitches (sutures), skin glue, or adhesive strips in place. These skin closures may need to stay in place for 2 weeks or longer. If adhesive strip edges start to loosen and curl up, you may trim the loose edges. Do not remove adhesive strips completely unless your health care provider tells you to do that. ?If the area bleeds or bruises, apply gentle pressure for 10 minutes. ?OK TO SHOWER IN 24HRS ? ?Check your site every day for signs of infection. Check for: ?Redness, swelling, or pain. ?Fluid or blood. ?Warmth. ?Pus or a bad smell. ? ?General instructions ?Rest and then return to your normal activities as told by your health care provider. ?RESUME BLOOD THINNNERS IN 48HRS  ?tylenol and advil as needed for discomfort.  Please alternate between the two every four hours as needed for pain.   ? Use narcotics, if prescribed, only when tylenol and motrin is not enough to control pain. ? 325-'650mg'$  every 8hrs to max of '3000mg'$ /24hrs (including the '325mg'$  in every norco dose) for the tylenol.   ? Advil up to '800mg'$  per dose every 8hrs as needed for pain.   ?Keep all follow-up visits as told by your health care provider. This is important. ?Contact a health care provider if: ?You have redness, swelling, or pain around  your site. ?You have fluid or blood coming from your site. ?Your site feels warm to the touch. ?You have pus or a bad smell coming from your site. ?You have a fever. ?Your sutures, skin glue, or adhesive strips loosen or come off sooner than expected. ?Get help right away if: ?You have bleeding that does not stop with pressure or a dressing. ?Summary ?After the procedure, it is common to have some soreness, bruising, and itching at the site. ?Follow instructions from your health care provider about how to take care of your site. ?Check your site every day for signs of infection. ?Contact a health care provider if you have redness, swelling, or pain around your site, or your site feels warm to the touch. ?Keep all follow-up visits as told by your health care provider. This is important. ?This information is not intended to replace advice given to you by your health care provider. Make sure you discuss any questions you have with your health care provider. ?Document Released: 06/30/2015 Document Revised: 12/01/2017 Document Reviewed: 12/01/2017 ?Elsevier Interactive Patient Education ? 2019 [place]. ? ? ? ? ?The drugs that you were given will stay in your system until tomorrow so for the next 24 hours you should not: ? ?Drive an automobile ?Make any legal decisions ?Drink any alcoholic beverage ? ? ?You may resume regular meals tomorrow.  Today it is better to start with liquids and gradually work up to solid foods. ? ?You may eat anything you prefer, but it is better to start  with liquids, then soup and crackers, and gradually work up to solid foods. ? ? ?Please notify your doctor immediately if you have any unusual bleeding, trouble breathing, redness and pain at the surgery site, drainage, fever, or pain not relieved by medication. ? ? ? ?Additional Instructions: ? ? ? ? ? ? ? ?Please contact your physician with any problems or Same Day Surgery at [phone], Monday through Friday 6 am to 4 pm, or [facility] number at [phone].  ?

## 2021-09-28 NOTE — Anesthesia Preprocedure Evaluation (Addendum)
Anesthesia Evaluation  ?Patient identified by MRN, date of birth, ID band ?Patient awake ? ? ? ?Reviewed: ?Allergy & Precautions, H&P , NPO status , reviewed documented beta blocker date and time  ? ?Airway ?Mallampati: II ? ?TM Distance: >3 FB ?Neck ROM: full ? ? ? Dental ? ?(+) Edentulous Upper, Edentulous Lower ?  ?Pulmonary ?shortness of breath, COPD,  COPD inhaler, Current Smoker and Patient abstained from smoking.,  ?  ?Pulmonary exam normal ? ? ? ? ? ? ? Cardiovascular ?Exercise Tolerance: Poor ?METS: 3 - Mets hypertension, Pt. on home beta blockers and Pt. on medications ?+ CAD, + Past MI, + Cardiac Stents (x2), + Peripheral Vascular Disease (s/p aortobifemoral bypass) and + DOE  ?Normal cardiovascular exam ? ?Carotid stenosis ?  ?Neuro/Psych ?PSYCHIATRIC DISORDERS Depression negative neurological ROS ?   ? GI/Hepatic ?hiatal hernia (large), GERD  Controlled,  ?Endo/Other  ?diabetesPre-diabetes  ? Renal/GU ?  ? ?  ?Musculoskeletal ? ?(+) Arthritis ,  ? Abdominal ?Normal abdominal exam  (+)   ?Peds ? Hematology ?  ?Anesthesia Other Findings ?Past Medical History: ?No date: Arthritis ?    Comment:  LEFT KNEE  ?No date: CAD (coronary artery disease) ?No date: COPD (chronic obstructive pulmonary disease) ([place]) ?No date: Depression ?No date: Emphysema lung ([place]) ?No date: GERD (gastroesophageal reflux disease) ?No date: Hernia, hiatal ?No date: Hyperlipidemia ?No date: Hypertension ?No date: Peripheral vascular disease ([place]) ?No date: Pre-diabetes ?No date: Thrombosis ?    Comment:  in Lt leg ?Past Surgical History: ?No date: ABDOMINAL AORTA STENT ?No date: ABDOMINAL HYSTERECTOMY ?2005: aortobifemoral bypass ?No date: CESAREAN SECTION ?    Comment:  x 3 ?03/10/2019: COLONOSCOPY WITH PROPOFOL; N/A ?    Comment:  Procedure: COLONOSCOPY WITH PROPOFOL;  Surgeon:  ?             Virgel Manifold, MD;  Location: [facility] ENDOSCOPY;   ?             Service: Endoscopy;  Laterality:  N/A; ?03/10/2019: ESOPHAGOGASTRODUODENOSCOPY (EGD) WITH PROPOFOL; N/A ?    Comment:  Procedure: ESOPHAGOGASTRODUODENOSCOPY (EGD) WITH  ?             PROPOFOL;  Surgeon: Virgel Manifold, MD;  Location:  ?             [facility] ENDOSCOPY;  Service: Endoscopy;  Laterality: N/A; ?08/10/2020: EVALUATION UNDER ANESTHESIA WITH HEMORRHOIDECTOMY; N/A ?    Comment:  Procedure: EXAM UNDER ANESTHESIA WITH HEMORRHOIDECTOMY;  ?             Surgeon: Benjamine Sprague, DO;  Location: [facility] ORS;  Service: ?             General;  Laterality: N/A; ?No date: femoral stents ?10/02/2016: LOWER EXTREMITY ANGIOGRAPHY; Left ?    Comment:  Procedure: Lower Extremity Angiography;  Surgeon: Corene Cornea  ?             Bunnie Domino, MD;  Location: [place] CV LAB;  Service:  ?             Cardiovascular;  Laterality: Left; ?10/03/2016: LOWER EXTREMITY ANGIOGRAPHY; N/A ?    Comment:  Procedure: Lower Extremity Angiography with possible  ?             revascularization;  Surgeon: Algernon Huxley, MD;  Location:  ?             [place] CV LAB;  Service: Cardiovascular;   ?  Laterality: N/A; ?09/13/2020: PORTACATH PLACEMENT; N/A ?    Comment:  Procedure: INSERTION PORT-A-CATH;  Surgeon: Lysle Pearl,  ?             Isami, DO;  Location: [facility] ORS;  Service: General;   ?             Laterality: N/A; ?No date: VASCULAR SURGERY ?BMI   ? Body Mass Index: 27.78 kg/m?  ?  ? Reproductive/Obstetrics ? ?  ? ? ? ? ? ? ? ? ? ? ? ? ? ?  ?  ? ? ? ? ? ? ?Anesthesia Physical ? ?Anesthesia Plan ? ?ASA: III ? ?Anesthesia Plan: General  ? ?Post-op Pain Management:   ? ?Induction: Intravenous ? ?PONV Risk Score and Plan: Ondansetron, Dexamethasone and Treatment may vary due to age or medical condition ? ?Airway Management Planned: Oral ETT ? ?Additional Equipment:  ? ?Intra-op Plan:  ? ?Post-operative Plan: Extubation in OR ? ?Informed Consent: I have reviewed the patients History and Physical, chart, labs and discussed the procedure including the risks, benefits and alternatives  for the proposed anesthesia with the patient or authorized representative who has indicated his/her understanding and acceptance.  ? ? ? ?Dental Advisory Given ? ?Plan Discussed with: CRNA and Anesthesiologist ? ?Anesthesia Plan Comments:   ? ? ? ? ? ?Anesthesia Quick Evaluation ? ?

## 2021-09-28 NOTE — Op Note (Addendum)
Preoperative diagnosis: Anal cancer ?Postoperative diagnosis: same ? ?Procedure: Exam under anesthesia, biopsy of anal mass ? ?Anesthesia: LMA ? ?Surgeon: Lysle Pearl ? ?Wound Classification: Clean Contaminated ? ?Specimen: None ? ?Complications: None ? ?EBL: 3 mL ? ?Indications:  Patient is a 71 y.o. female with clinical exam concerning for persistent anal cancer. Here today for formal exam under anesthesia.  See H&P for further details. ? ?Description of procedure: The patient was taken to the operating room and placed in high lithotomy position.  LMA anesthesia was induced without any difficulty. A time-out was completed verifying correct patient, procedure, site, positioning, and implant(s) and/or special equipment prior to beginning this procedure. ? ?Digital rectal exam noted persistent mass like structure located in anterior aspect spanning from Plummer position to 3oclock position, extending total of roughly 6cm into anal canal on manual palpation.  Small area of ulceration noted with some caseuous looking discharge.  After infusion of local anesthesia, An elliptical incision was made around the area of discharge and biopsied tissue sent as frozen section to pathology, call back confirming persistent anal cancer.   ? ?Procedure terminated at this point and after confirming hemostasis, wound was dressed with fluffs, secured with mesh undergarments. ? ?The patient tolerated the procedure well and  taken to the postoperative care unit in stable condition.  Sponge and instrument count was correct at the end of the procedure. ? ?

## 2021-09-28 NOTE — Interval H&P Note (Signed)
OK to proceed. No change.

## 2021-09-28 NOTE — Transfer of Care (Signed)
Immediate Anesthesia Transfer of Care Note ? ?Patient: Mariah Rogers ? ?Procedure(s) Performed: RECTAL EXAM UNDER ANESTHESIA (Rectum) ? ?Patient Location: PACU ? ?Anesthesia Type:General ? ?Level of Consciousness: awake, drowsy and patient cooperative ? ?Airway & Oxygen Therapy: Patient Spontanous Breathing and Patient connected to face mask oxygen ? ?Post-op Assessment: Report given to RN and Post -op Vital signs reviewed and stable ? ?Post vital signs: Reviewed and stable ? ?Last Vitals:  ?Vitals Value Taken Time  ?BP 130/62 09/28/21 1015  ?Temp 37.2 ?C 09/28/21 0959  ?Pulse 64 09/28/21 1016  ?Resp 16 09/28/21 1016  ?SpO2 94 % 09/28/21 1016  ?Vitals shown include unvalidated device data. ? ?Last Pain:  ?Vitals:  ? 09/28/21 0959  ?TempSrc:   ?PainSc: 0-No pain  ?   ? ?  ? ?Complications: No notable events documented. ?

## 2021-10-01 ENCOUNTER — Other Ambulatory Visit: Payer: Self-pay | Admitting: Surgery

## 2021-10-01 DIAGNOSIS — C210 Malignant neoplasm of anus, unspecified: Secondary | ICD-10-CM

## 2021-10-01 LAB — SURGICAL PATHOLOGY

## 2021-10-04 ENCOUNTER — Ambulatory Visit
Admission: RE | Admit: 2021-10-04 | Discharge: 2021-10-04 | Disposition: A | Discharge status: Home / Self Care | Payer: Medicare Other | Source: Ambulatory Visit | Attending: Surgery | Admitting: Surgery

## 2021-10-04 DIAGNOSIS — C210 Malignant neoplasm of anus, unspecified: Secondary | ICD-10-CM

## 2021-10-04 MED ORDER — IOHEXOL 300 MG/ML  SOLN
75.0000 mL | Freq: Once | INTRAMUSCULAR | Status: AC | PRN
  Administered 2021-10-04: 75 mL via INTRAVENOUS

## 2021-10-08 ENCOUNTER — Telehealth: Payer: Self-pay

## 2021-10-08 ENCOUNTER — Other Ambulatory Visit: Payer: Self-pay | Admitting: Internal Medicine

## 2021-10-08 DIAGNOSIS — C211 Malignant neoplasm of anal canal: Secondary | ICD-10-CM

## 2021-10-08 NOTE — Progress Notes (Signed)
Patient notified.  Please schedule as MD recommends and inform her of appt details.   ?

## 2021-10-08 NOTE — Progress Notes (Signed)
Please inform patient that I would recommend a follow-up with PET scan as recommended by the surgeon. ? ?PET scan was ordered; ASAP; please schedule. ? ?Please schedule follow-up 1 to 2 days post PET scan- MD; labs CBC CMP; Thanks ?GB ?

## 2021-10-08 NOTE — Telephone Encounter (Signed)
Mariah Rogers was seen today at [facility] by Dr. Marcello Moores. They have ordered a PET scan and are requesting she be seen by medical oncology following. Will arrange appointment with Dr. Rogue Bussing once PET scheduled. ?

## 2021-10-08 NOTE — Addendum Note (Signed)
Addended by: Vanice Sarah on: 10/08/2021 01:40 PM ? ? Modules accepted: Orders ? ?

## 2021-10-11 ENCOUNTER — Encounter
Admission: RE | Admit: 2021-10-11 | Discharge: 2021-10-11 | Disposition: A | Discharge status: Home / Self Care | Payer: Medicare Other | Source: Ambulatory Visit | Attending: Internal Medicine | Admitting: Internal Medicine

## 2021-10-11 DIAGNOSIS — C211 Malignant neoplasm of anal canal: Secondary | ICD-10-CM | POA: Diagnosis present

## 2021-10-11 MED ORDER — FLUDEOXYGLUCOSE F - 18 (FDG) INJECTION
6.7000 | Freq: Once | INTRAVENOUS | Status: AC | PRN
  Administered 2021-10-11: 6.99 via INTRAVENOUS

## 2021-10-15 ENCOUNTER — Inpatient Hospital Stay: Payer: Medicare Other | Attending: Internal Medicine

## 2021-10-15 ENCOUNTER — Telehealth: Payer: Self-pay

## 2021-10-15 ENCOUNTER — Encounter: Payer: Self-pay | Admitting: Internal Medicine

## 2021-10-15 ENCOUNTER — Inpatient Hospital Stay (HOSPITAL_BASED_OUTPATIENT_CLINIC_OR_DEPARTMENT_OTHER): Payer: Medicare Other | Admitting: Internal Medicine

## 2021-10-15 DIAGNOSIS — R102 Pelvic and perineal pain: Secondary | ICD-10-CM | POA: Diagnosis not present

## 2021-10-15 DIAGNOSIS — F17210 Nicotine dependence, cigarettes, uncomplicated: Secondary | ICD-10-CM | POA: Diagnosis not present

## 2021-10-15 DIAGNOSIS — J449 Chronic obstructive pulmonary disease, unspecified: Secondary | ICD-10-CM | POA: Diagnosis not present

## 2021-10-15 DIAGNOSIS — C211 Malignant neoplasm of anal canal: Secondary | ICD-10-CM | POA: Diagnosis not present

## 2021-10-15 DIAGNOSIS — K449 Diaphragmatic hernia without obstruction or gangrene: Secondary | ICD-10-CM | POA: Insufficient documentation

## 2021-10-15 DIAGNOSIS — D3502 Benign neoplasm of left adrenal gland: Secondary | ICD-10-CM | POA: Insufficient documentation

## 2021-10-15 DIAGNOSIS — Z800 Family history of malignant neoplasm of digestive organs: Secondary | ICD-10-CM | POA: Diagnosis not present

## 2021-10-15 DIAGNOSIS — F1290 Cannabis use, unspecified, uncomplicated: Secondary | ICD-10-CM | POA: Diagnosis not present

## 2021-10-15 DIAGNOSIS — I739 Peripheral vascular disease, unspecified: Secondary | ICD-10-CM | POA: Insufficient documentation

## 2021-10-15 DIAGNOSIS — I2510 Atherosclerotic heart disease of native coronary artery without angina pectoris: Secondary | ICD-10-CM | POA: Diagnosis not present

## 2021-10-15 DIAGNOSIS — I700 Atherosclerosis of aorta: Secondary | ICD-10-CM | POA: Insufficient documentation

## 2021-10-15 DIAGNOSIS — C210 Malignant neoplasm of anus, unspecified: Secondary | ICD-10-CM | POA: Diagnosis not present

## 2021-10-15 LAB — COMPREHENSIVE METABOLIC PANEL
ALT: 11 U/L (ref 0–44)
AST: 13 U/L — ABNORMAL LOW (ref 15–41)
Albumin: 3.5 g/dL (ref 3.5–5.0)
Alkaline Phosphatase: 92 U/L (ref 38–126)
Anion gap: 6 (ref 5–15)
BUN: 12 mg/dL (ref 8–23)
CO2: 24 mmol/L (ref 22–32)
Calcium: 8.4 mg/dL — ABNORMAL LOW (ref 8.9–10.3)
Chloride: 103 mmol/L (ref 98–111)
Creatinine, Ser: 0.92 mg/dL (ref 0.44–1.00)
GFR, Estimated: >60 mL/min (ref >60)
Glucose, Bld: 124 mg/dL — ABNORMAL HIGH (ref 70–99)
Potassium: 3.9 mmol/L (ref 3.5–5.1)
Sodium: 133 mmol/L — ABNORMAL LOW (ref 135–145)
Total Bilirubin: 0.3 mg/dL (ref 0.3–1.2)
Total Protein: 7.2 g/dL (ref 6.5–8.1)

## 2021-10-15 LAB — CBC WITH DIFFERENTIAL/PLATELET
Abs Immature Granulocytes: 0.02 10*3/uL (ref 0.00–0.07)
Basophils Absolute: 0 10*3/uL (ref 0.0–0.1)
Basophils Relative: 0 %
Eosinophils Absolute: 0.1 10*3/uL (ref 0.0–0.5)
Eosinophils Relative: 2 %
HCT: 36.1 % (ref 36.0–46.0)
Hemoglobin: 12 g/dL (ref 12.0–15.0)
Immature Granulocytes: 0 %
Lymphocytes Relative: 27 %
Lymphs Abs: 1.8 10*3/uL (ref 0.7–4.0)
MCH: 30.8 pg (ref 26.0–34.0)
MCHC: 33.2 g/dL (ref 30.0–36.0)
MCV: 92.6 fL (ref 80.0–100.0)
Monocytes Absolute: 0.3 10*3/uL (ref 0.1–1.0)
Monocytes Relative: 5 %
Neutro Abs: 4.2 10*3/uL (ref 1.7–7.7)
Neutrophils Relative %: 66 %
Platelets: 199 10*3/uL (ref 150–400)
RBC: 3.9 MIL/uL (ref 3.87–5.11)
RDW: 14 % (ref 11.5–15.5)
WBC: 6.5 10*3/uL (ref 4.0–10.5)
nRBC: 0 % (ref 0.0–0.2)

## 2021-10-15 MED ORDER — ONDANSETRON HCL 8 MG PO TABS: ORAL_TABLET | ORAL | 1 refills | Status: AC

## 2021-10-15 MED ORDER — OXYCODONE-ACETAMINOPHEN 5-325 MG PO TABS: 1.0000 | ORAL_TABLET | Freq: Four times a day (QID) | ORAL | 0 refills | Status: DC | PRN

## 2021-10-15 MED ORDER — PROCHLORPERAZINE MALEATE 10 MG PO TABS: 10.0000 mg | ORAL_TABLET | Freq: Four times a day (QID) | ORAL | 1 refills | Status: AC | PRN

## 2021-10-15 NOTE — Progress Notes (Signed)
Patient states she is having pain in the rectal area. Pain level is a 10. Patient states its a burning pain with bowel movements and urine. Patient is unable to sit comfortable.  ?

## 2021-10-15 NOTE — Progress Notes (Signed)
Mariah Rogers ?CONSULT NOTE ? ?Patient Care Team: ?Letta Median, MD as PCP - General (Family Medicine) ?Clent Jacks, RN as Oncology Nurse Navigator ?Benjamine Sprague, DO as Consulting Physician (Surgery) ?Cammie Sickle, MD as Consulting Physician (Oncology) ?[name], MD as Consulting Physician (Radiation Oncology) ?Virgel Manifold, MD (Inactive) as Consulting Physician (Gastroenterology) ? ?CHIEF COMPLAINTS/PURPOSE OF CONSULTATION: anal cancer ? ?#  ?Oncology History Overview Note  ?# FEB-MARCH 2022- ANAL CA- EXCISIONAL BIOPSY:  ?- INVASIVE MODERATELY DIFFERENTIATED SQUAMOUS CELL CARCINOMA, PRESENT AT DEEP AND LATERAL TISSUE EDGES. [Dr.Sakai] ? ?DIAGNOSIS:  ?A. ANAL LESION; BIOPSY:  ?- INVASIVE SQUAMOUS CELL CARCINOMA.  ? ?Comment:  ?Invasive keratinizing squamous cell carcinoma, moderately  ?differentiated, is present in multiple fragments, extending to the deep  ?and peripheral edges of the tissue.  ?IMPRESSION: ?1. Metastatic anal cancer as evidenced by a hypermetabolic anal ?mass, hypermetabolic left lower lobe nodule, hypermetabolic lymph ?nodes in the chest/pelvis and a probable left sacral metastasis. ? ?# May 18th- CARBO-TAXOL q 3 W; udenyca ? ?# PVD [s/p Stent; Dr.Dew]on Eliquis; active smoker;  ? ?# SURVIVORSHIP:  ? ?# GENETICS:  ? ?DIAGNOSIS:  ? ?STAGE:         ;  GOALS: ? ?CURRENT/MOST RECENT THERAPY :  ? ?  ?Cancer of anal canal ([place])  ?08/18/2020 Initial Diagnosis  ? Cancer of anal canal [facility]) ?  ?09/29/2020 - 11/03/2020 Chemotherapy  ? Patient is on Treatment Plan : ANUS Mitomycin D1,28 / 5FU D1-4, 28-31 q32d  ? ?  ?  ?10/15/2021 Cancer Staging  ? Staging form: Anus, AJCC 8th Edition ?- Clinical: Stage IV (cT2, cN1c, cM1) - Signed by Cammie Sickle, MD on 10/15/2021 ? ?  ?10/15/2021 -  Chemotherapy  ? Patient is on Treatment Plan : anal cancer- Carboplatin + Paclitaxel q21d  ? ?  ?  ? ? ?HISTORY OF PRESENTING ILLNESS: Accompanied by her son.  Ambulating  independently. ? ?Mariah Rogers 71 y.o.  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 ? ?Patient in the interim was evaluated by surgery-noted to have recurrent disease.  Status post biopsy.  Patient also status post evaluation with surgery-however given imaging findings concerning for metastases, patient has been referred to medical oncology for further evaluation. ? ?Patient complains of pelvic pressure.  Complains of anal discharge.  Denies any blood in stools or black-colored stools. Patient denies any nausea vomiting.  Denies chest pain.  No difficulty breathing.   ? ?Review of Systems  ?Constitutional:  Negative for chills, diaphoresis, fever and weight loss.  ?HENT:  Negative for nosebleeds and sore throat.   ?Eyes:  Negative for double vision.  ?Respiratory:  Negative for cough, hemoptysis, sputum production and wheezing.   ?Cardiovascular:  Negative for chest pain, palpitations, orthopnea and leg swelling.  ?Gastrointestinal:  Negative for abdominal pain, constipation, heartburn and melena.  ?Genitourinary:  Negative for dysuria, frequency and urgency.  ?Musculoskeletal:  Positive for back pain and joint pain.  ?Skin: Negative.  Negative for itching and rash.  ?Neurological:  Negative for dizziness, tingling, focal weakness, weakness and headaches.  ?Endo/Heme/Allergies:  Does not bruise/bleed easily.  ?Psychiatric/Behavioral:  Negative for depression. The patient is not nervous/anxious and does not have insomnia.    ? ?MEDICAL HISTORY:  ?Past Medical History:  ?Diagnosis Date  ? Arthritis   ? LEFT KNEE , both knee  ? CAD (coronary artery disease)   ?  x 1 stent  ? Cancer [facility])   ? COPD (chronic obstructive pulmonary disease) ([place])   ? Depression   ? Diabetes mellitus without complication ([place])   ? Dyspnea   ? Dysrhythmia   ? Emphysema lung ([place])   ? GERD (gastroesophageal reflux disease)   ? Hernia,  hiatal   ? Hyperlipidemia   ? Hypertension   ? Myocardial infarction [facility])   ? x 2 stents  ? Peripheral vascular disease ([place])   ? Pre-diabetes   ? Thrombosis   ? in Lt leg  ? ? ?SURGICAL HISTORY: ?Past Surgical History:  ?Procedure Laterality Date  ? ABDOMINAL AORTA STENT    ? ABDOMINAL HYSTERECTOMY    ? aortobifemoral bypass  2005  ? CESAREAN SECTION    ? x 3  ? COLONOSCOPY WITH PROPOFOL N/A 03/10/2019  ? Procedure: COLONOSCOPY WITH PROPOFOL;  Surgeon: Virgel Manifold, MD;  Location: [facility] ENDOSCOPY;  Service: Endoscopy;  Laterality: N/A;  ? ESOPHAGOGASTRODUODENOSCOPY (EGD) WITH PROPOFOL N/A 03/10/2019  ? Procedure: ESOPHAGOGASTRODUODENOSCOPY (EGD) WITH PROPOFOL;  Surgeon: Virgel Manifold, MD;  Location: [facility] ENDOSCOPY;  Service: Endoscopy;  Laterality: N/A;  ? EVALUATION UNDER ANESTHESIA WITH HEMORRHOIDECTOMY N/A 08/10/2020  ? Procedure: EXAM UNDER ANESTHESIA WITH HEMORRHOIDECTOMY;  Surgeon: Benjamine Sprague, DO;  Location: [facility] ORS;  Service: General;  Laterality: N/A;  ? femoral stents    ? LOWER EXTREMITY ANGIOGRAPHY Left 10/02/2016  ? Procedure: Lower Extremity Angiography;  Surgeon: Algernon Huxley, MD;  Location: [place] CV LAB;  Service: Cardiovascular;  Laterality: Left;  ? LOWER EXTREMITY ANGIOGRAPHY N/A 10/03/2016  ? Procedure: Lower Extremity Angiography with possible revascularization;  Surgeon: Algernon Huxley, MD;  Location: [place] CV LAB;  Service: Cardiovascular;  Laterality: N/A;  ? PORTACATH PLACEMENT N/A 09/13/2020  ? Procedure: INSERTION PORT-A-CATH;  Surgeon: Benjamine Sprague, DO;  Location: [facility] ORS;  Service: General;  Laterality: N/A;  ? PORTACATH PLACEMENT Right 09/28/2020  ? Procedure: REVISION/REPOSITIONING OF PORT-A-CATH;  Surgeon: Benjamine Sprague, DO;  Location: [facility] ORS;  Service: General;  Laterality: Right;  ? RECTAL EXAM UNDER ANESTHESIA N/A 09/28/2021  ? Procedure: RECTAL EXAM UNDER ANESTHESIA;  Surgeon: Benjamine Sprague, DO;  Location: [facility] ORS;  Service: General;  Laterality: N/A;  ?  VASCULAR SURGERY    ? ? ?SOCIAL HISTORY: ?Social History  ? ?Socioeconomic History  ? Marital status: Widowed  ?  Spouse name: Not on file  ? Number of children: 1  ? Years of education: Not on file  ? Highest education level: Not on file  ?Occupational History  ? Not on file  ?Tobacco Use  ? Smoking status: Every Day  ?  Packs/day: 0.25  ?  Years: 60.00  ?  Pack years: 15.00  ?  Types: Cigarettes  ? Smokeless tobacco: Never  ?Vaping Use  ? Vaping Use: Never used  ?Substance and Sexual Activity  ? Alcohol use: No  ? Drug use: Yes  ?  Types: Marijuana  ?  Comment: 2-3 times a week- smoked last night 09/22/20  ? Sexual activity: Never  ?Other Topics Concern  ? Not on file  ?Social History Narrative  ? Lives in [place]. Smokes-1 pck in 3 days. No alcohol. Worked in [employer]. Son with her son.   ? ?Social Determinants of Health  ? ?Financial Resource Strain: Not on file  ?Food Insecurity: Not on file  ?Transportation Needs: Not on file  ?Physical Activity: Not on file  ?Stress: Not on file  ?Social Connections: Not on file  ?  Intimate Partner Violence: Not on file  ? ? ?FAMILY HISTORY: ?Family History  ?Problem Relation Age of Onset  ? Osteoporosis Mother   ? Alzheimer's disease Mother   ? Colon cancer Daughter 37  ? ? ?ALLERGIES:  is allergic to codeine and metformin and related. ? ?MEDICATIONS:  ?Current Outpatient Medications  ?Medication Sig Dispense Refill  ? acetaminophen (TYLENOL) 325 MG tablet Take 2 tablets (650 mg total) by mouth every 8 (eight) hours as needed for mild pain. 40 tablet 0  ? albuterol (VENTOLIN HFA) 108 (90 Base) MCG/ACT inhaler Inhale 2 puffs into the lungs every 6 (six) hours as needed for wheezing or shortness of breath.    ? apixaban (ELIQUIS) 5 MG TABS tablet Take 1 tablet (5 mg total) by mouth 2 (two) times daily. 90 tablet 4  ? atorvastatin (LIPITOR) 80 MG tablet Take 80 mg by mouth daily.    ? clopidogrel (PLAVIX) 75 MG tablet Take 75 mg by mouth daily.    ? ezetimibe (ZETIA) 10 MG  tablet Take 10 mg by mouth at bedtime.    ? fluticasone-salmeterol (ADVAIR) 250-50 MCG/ACT AEPB Inhale 1 puff into the lungs daily.    ? lisinopril (ZESTRIL) 5 MG tablet Take 5 mg by mouth daily.    ? metoprolol tartr

## 2021-10-15 NOTE — Progress Notes (Signed)
DISCONTINUE ON PATHWAY REGIMEN - Anal Carcinoma ? ? ?  One cycle, concurrent with RT: ?    Fluorouracil  ?    Mitomycin  ? ?**Always confirm dose/schedule in your pharmacy ordering system** ? ?REASON: Disease Progression ?PRIOR TREATMENT: ANLOS01: Fluorouracil 1,000 mg/m2/day CIV D1-4, 29-32 + Mitomycin 10 mg/m2 D1, 29 + Concurrent Radiation Therapy ?TREATMENT RESPONSE: Progressive Disease (PD) ? ?START ON PATHWAY REGIMEN - Anal Carcinoma ? ? ?  A cycle is every 28 days: ?    Paclitaxel  ?    Carboplatin  ? ?**Always confirm dose/schedule in your pharmacy ordering system** ? ?Patient Characteristics: ?Anal Canal Tumors, Distant Metastases / Local Recurrence - Unresectable, First Line ?Therapeutic Status: Distant Metastases ?Line of therapy: First Line ? ?Intent of Therapy: ?Non-Curative / Palliative Intent, Discussed with Patient ?

## 2021-10-15 NOTE — Telephone Encounter (Signed)
[facility] order submitted online [id number]) for path Accession [id number] .  Also faxed [facility] pathology to notify them testing is ordered.   ?

## 2021-10-15 NOTE — Assessment & Plan Note (Addendum)
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 Metastatic anal cancer as evidenced by a hypermetabolic anal mass, hypermetabolic left lower lobe nodule, hypermetabolic lymph nodes in the chest/pelvis and a probable left sacral metastasis.  Discuss at tumor conference. Check NGS.  ? ?#April 2023 -endoscopy biopsy positive for recurrent squamous cell carcinoma I reviewed with the patient/son the pathology; stage-Iv in detail.  Understands treatments are palliative and not curative.  Treatments 4-6 cycles.  We will do interim scan after 3 cycles.  ? ?#Recommend CarboTaxol chemotherapy every 3 weeks; with factor support. Discussed the potential side effects including but not limited to-increasing fatigue, nausea vomiting, diarrhea, hair loss, sores in the mouth, increase risk of infection and also neuropathy. ?Antiemetics sent to pharmacy. ? ?#Pelvic pressure/pain-secondary to recurrent malignancy.  Start patient on oxycodone 5 mg every 4 to 6 hours.  Prescription sent.  Recommend sitz bath.  ? ?#COPD-clinically stable. ? ?# PVD- on eliquis-stable. ? ?# Smoker active again recommended patient quit smoking; especially in the context of her COPD/CAD and risk for  lung and other malignancies.  ? ?# Mediport: functional.  ? ?#Incidental findings on Imaging August 16, 2021 CT scan- Left adrenal adenoma; Moderate hiatal hernia; Aortic atherosclerosis; Coronary artery calcification. I reviewed/discussed/counseled the patient.  ? ?# DISPOSITION:  ?# follow up in  1 week- MD; port/labs- cbc/cmp; carbo-taxol; d-2 undeyca-Dr.B ? ?# I reviewed the blood work- with the patient in detail; also reviewed the imaging independently [as summarized above]; and with the patient in detail.  ? ?# 40 minutes face-to-face with the patient discussing the above plan of care; more than 50% of time spent on prognosis/ natural history; counseling and  coordination. ? ? ?

## 2021-10-16 ENCOUNTER — Other Ambulatory Visit: Payer: Self-pay | Admitting: Internal Medicine

## 2021-10-16 LAB — GLUCOSE, CAPILLARY: Glucose-Capillary: 109 mg/dL — ABNORMAL HIGH (ref 70–99)

## 2021-10-16 NOTE — Progress Notes (Signed)
Pharmacist Chemotherapy Monitoring - Initial Assessment   ? ?Anticipated start date: 10/23/21  ? ?The following has been reviewed per standard work regarding the patient's treatment regimen: ?The patient's diagnosis, treatment plan and drug doses, and organ/hematologic function ?Lab orders and baseline tests specific to treatment regimen  ?The treatment plan start date, drug sequencing, and pre-medications ?Prior authorization status  ?Patient's documented medication list, including drug-drug interaction screen and prescriptions for anti-emetics and supportive care specific to the treatment regimen ?The drug concentrations, fluid compatibility, administration routes, and timing of the medications to be used ?The patient's access for treatment and lifetime cumulative dose history, if applicable  ?The patient's medication allergies and previous infusion related reactions, if applicable  ? ?Changes made to treatment plan:  ?treatment plan date ? ?Follow up needed:  ?Pending authorization for treatment  ? ? ?Adelina Mings, [facility], ?10/16/2021  11:28 AM  ?

## 2021-10-18 ENCOUNTER — Other Ambulatory Visit: Payer: Medicare Other

## 2021-10-18 NOTE — Progress Notes (Signed)
Tumor Board Documentation ? ?Mariah Rogers was presented by Dr Rogue Bussing at our Tumor Board on 10/18/2021, which included representatives from medical oncology, surgical, pharmacy, pulmonology, genetics, radiology, pathology, research, navigation, radiation oncology, internal medicine, palliative care. ? ?Mariah Rogers currently presents as a current patient, for discussion with history of the following treatments: adjuvant chemotherapy. ? ?Additionally, we reviewed previous medical and familial history, history of present illness, and recent lab results along with all available histopathologic and imaging studies. The tumor board considered available treatment options and made the following recommendations: ?Active surveillance ?  ? ?The following procedures/referrals were also placed: No orders of the defined types were placed in this encounter. ? ? ?Clinical Trial Status: not discussed  ? ?Staging used: AJCC Stage Group ?AJCC Staging: ?T: 2 ?N: 1 ?  ?Group: Stage III Squamous Cell Anal CAncer  ? ?National site-specific guidelines NCCN were discussed with respect to the case. ? ?Tumor board is a meeting of clinicians from various specialty areas who evaluate and discuss patients for whom a multidisciplinary approach is being considered. Final determinations in the plan of care are those of the provider(s). The responsibility for follow up of recommendations given during tumor board is that of the provider.  ? ?Today?s extended care, comprehensive team conference, Mariah Rogers was not present for the discussion and was not examined.  ? ?Multidisciplinary Tumor Board is a multidisciplinary case peer review process.  Decisions discussed in the Multidisciplinary Tumor Board reflect the opinions of the specialists present at the conference without having examined the patient.  Ultimately, treatment and diagnostic decisions rest with the primary provider(s) and the patient. ? ?

## 2021-10-22 ENCOUNTER — Encounter: Payer: Self-pay | Admitting: Internal Medicine

## 2021-10-22 MED FILL — Fosaprepitant Dimeglumine For IV Infusion 150 MG (Base Eq): INTRAVENOUS | Qty: 5 | Status: AC

## 2021-10-22 MED FILL — Dexamethasone Sodium Phosphate Inj 100 MG/10ML: INTRAMUSCULAR | Qty: 1 | Status: AC

## 2021-10-23 ENCOUNTER — Telehealth: Payer: Self-pay | Admitting: Internal Medicine

## 2021-10-23 ENCOUNTER — Telehealth: Payer: Self-pay

## 2021-10-23 ENCOUNTER — Inpatient Hospital Stay: Payer: Medicare Other | Admitting: Internal Medicine

## 2021-10-23 ENCOUNTER — Inpatient Hospital Stay: Payer: Medicare Other

## 2021-10-23 ENCOUNTER — Inpatient Hospital Stay: Payer: Medicare Other | Attending: Hospice and Palliative Medicine | Admitting: Hospice and Palliative Medicine

## 2021-10-23 DIAGNOSIS — C211 Malignant neoplasm of anal canal: Secondary | ICD-10-CM

## 2021-10-23 MED ORDER — OXYCODONE-ACETAMINOPHEN 5-325 MG PO TABS
1.0000 | ORAL_TABLET | ORAL | 0 refills | Status: DC | PRN
Start: 2021-10-23 — End: 2021-10-25

## 2021-10-23 NOTE — Telephone Encounter (Signed)
called pt and spoke with son, per RN HS, patient will have telephone call with Merrily Pew Borders today.Marland KitchenKJ  ?

## 2021-10-23 NOTE — Telephone Encounter (Signed)
Communicated to Josh B, NP regarding patient and her son's concerns of increasing pain limiting functionality.  Merrily Pew will do a telephone visit with patient/son due to no capabilities of virtual visit.   ?

## 2021-10-23 NOTE — Progress Notes (Signed)
Virtual Visit via Telephone Note ? ?I connected with Mariah Rogers on 10/23/21 at  2:00 PM EDT by telephone and verified that I am speaking with the correct person using two identifiers. ? ?Location: ?Patient: Home ?Provider: Clinic ?  ?I discussed the limitations, risks, security and privacy concerns of performing an evaluation and management service by telephone and the availability of in person appointments. I also discussed with the patient that there may be a patient responsible charge related to this service. The patient expressed understanding and agreed to proceed. ? ? ?History of Present Illness: ?Mariah Rogers is a 71 year old woman with multiple medical problems including COPD, stage IV anal cancer.  Patient is status post chemo/RT in 2022.  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  She is pending initiation of CarboTaxol chemotherapy but has had intense anal pain. ?  ?Observations/Objective: ?Patient was scheduled for chemotherapy today but was unable to come into the clinic due to intense rectal/anal pain.  Discussed with patient's son.  It sounds like patient is infrequently using Percocet.  Initially, Percocet seem to be completely relieving the pain but son reports that it is no longer effective.  Recommended liberalizing Percocet to 1 to 2 tablets every 4 hours as needed.  Can consider starting her on a long-acting opioid if needed.  We could also reach out to IR to explore options for nerve blocks. ? ?Assessment and Plan: ?Stage IV anal cancer -reschedule chemotherapy ? ?Neoplasm related pain -liberalize Percocet 1 to 2 tablets every 4 hours as needed.  We will consider starting her on a long-acting opioid if frequent Percocet dosing is required.  Continue daily bowel regimen ? ?Follow Up Instructions: ?Follow-up telephone visit in 1 week ?  ?I discussed the assessment and treatment plan with the patient. The patient was provided an opportunity to ask questions  and all were answered. The patient agreed with the plan and demonstrated an understanding of the instructions. ?  ?The patient was advised to call back or seek an in-person evaluation if the symptoms worsen or if the condition fails to improve as anticipated. ? ?I provided 10 minutes of non-face-to-face time during this encounter. ? ? ?Irean Hong, NP ? ? ?

## 2021-10-23 NOTE — Telephone Encounter (Signed)
Call made to check on patient since she did not come for her first chemo appt today.   ? ?Spoke with her son JR who states that Ms. Ketchem' rectal pain/burning sensation is worsened to the point that she does not want to get out of bed.  Not taking pain medication on a routine basis because it is not helping with the pain.  Not eating/drinking much. ? ?JR has considered taking her to the ER but patient refuses to leave the house.  JR thinks patients condition could improve if the pain is better controlled.   ?

## 2021-10-24 ENCOUNTER — Encounter: Payer: Self-pay | Admitting: Internal Medicine

## 2021-10-24 ENCOUNTER — Ambulatory Visit: Payer: Medicare Other

## 2021-10-25 ENCOUNTER — Other Ambulatory Visit: Payer: Self-pay

## 2021-10-25 ENCOUNTER — Encounter: Payer: Self-pay | Admitting: Internal Medicine

## 2021-10-25 ENCOUNTER — Telehealth: Payer: Self-pay | Admitting: Nurse Practitioner

## 2021-10-25 ENCOUNTER — Telehealth: Payer: Self-pay | Admitting: Internal Medicine

## 2021-10-25 NOTE — Telephone Encounter (Signed)
Refill request sent to MD.

## 2021-10-25 NOTE — Telephone Encounter (Signed)
Patient called the answering service ?and is requesting medication refill for percocet ?

## 2021-10-25 NOTE — Telephone Encounter (Signed)
Rx was not sent to pharmacy on 10/23/21 ?

## 2021-10-25 NOTE — Progress Notes (Signed)
Pharmacist Chemotherapy Monitoring - Initial Assessment   ? ?Anticipated start date: 11/01/21  ? ?The following has been reviewed per standard work regarding the patient's treatment regimen: ?The patient's diagnosis, treatment plan and drug doses, and organ/hematologic function ?Lab orders and baseline tests specific to treatment regimen  ?The treatment plan start date, drug sequencing, and pre-medications ?Prior authorization status  ?Patient's documented medication list, including drug-drug interaction screen and prescriptions for anti-emetics and supportive care specific to the treatment regimen ?The drug concentrations, fluid compatibility, administration routes, and timing of the medications to be used ?The patient's access for treatment and lifetime cumulative dose history, if applicable  ?The patient's medication allergies and previous infusion related reactions, if applicable  ? ?Changes made to treatment plan:  ?treatment plan date ? ?Follow up needed:  ?N/A ? ? ?Mariah Rogers, [facility], ?10/25/2021  11:48 AM  ?

## 2021-10-25 NOTE — Telephone Encounter (Signed)
Ret'd call to patient and spoke with her about the Palliative referral/services and all questions were answered and she was in agreement with beginning services with [place].  I have scheduled a Telephonic Consult for 10/30/21 @ 2:30 PM.  ?

## 2021-10-26 ENCOUNTER — Encounter: Payer: Self-pay | Admitting: Internal Medicine

## 2021-10-26 ENCOUNTER — Telehealth: Payer: Self-pay | Admitting: Internal Medicine

## 2021-10-26 MED ORDER — OXYCODONE-ACETAMINOPHEN 5-325 MG PO TABS: 1.0000 | ORAL_TABLET | ORAL | 0 refills | Status: AC | PRN

## 2021-10-26 NOTE — Telephone Encounter (Signed)
Josh sent rx for oxyCODONE-acetaminophen 5-325 MG Take 1-2 tablets by mouth every 4 (four) hours as needed for severe pain to pharmacy this morning.   ?

## 2021-10-26 NOTE — Telephone Encounter (Signed)
Message received as staff message:  Spoke with Son in regards to rescheduling appt for pt's care, he stated that  has been in communication a few times this week in regards to mom's condition. He does not foresee pt being able to come in for Infusion if her pain problem is not taken care of.. He would like to be contacted to speak about pain medication otherwise would like to cancel treatment. Please advise.Marland Kitchen KJ  ?

## 2021-10-26 NOTE — Telephone Encounter (Signed)
Pt son states He does not foresee pt being able to come in for Inf if her pain problem is not taken care of.. He would like to be contacted to speak about pain medication otherwise would like to cancel treatment.Marland KitchenK ?

## 2021-10-28 ENCOUNTER — Telehealth: Payer: Self-pay | Admitting: Internal Medicine

## 2021-10-28 NOTE — Telephone Encounter (Signed)
On 5/12-I returned patient's son call.  Left voicemail to call us back.  ?GB ?

## 2021-10-29 ENCOUNTER — Telehealth: Payer: Self-pay | Admitting: Internal Medicine

## 2021-10-29 MED ORDER — FENTANYL 50 MCG/HR TD PT72: 1.0000 | MEDICATED_PATCH | TRANSDERMAL | 0 refills | Status: AC

## 2021-10-29 NOTE — Telephone Encounter (Signed)
I had left 2 prior messages to discuss the pain issues. I was able to speak to family finally on third attempt.  ? ?I had a very long discussion with pt's son; Mariah Rogers who was very upset that pt's pain was poorly controlled. Pt currently taking percocets 5/325 -2 tablets every 4 hours. But pain not well controlled.  ? ?Mariah Rogers states that in AM he would take the pt to [facility] for further evaluation of her pain; possibility of nerve blocks etc. Pt does not want to go now. ? ?I offered my apologies that I was not aware of her situation until now; and the fact that pt is in sever pain. Mariah Rogers was ware of my attempts to reach pt. I reminded that I was reachable over the weekend as I was on call.  ? ?I called a script for fentanyl 50 mcg q 72 hours. And, I agreed with Mariah Rogers to take the patient to [facility] for further evaluation; and pain management as pt's pain is not well controlled; and as they are not happy with our services.  ? ?FYI-Josh.  ? ? ? ?

## 2021-10-30 ENCOUNTER — Inpatient Hospital Stay: Payer: Medicare Other | Admitting: Hospice and Palliative Medicine

## 2021-10-30 ENCOUNTER — Telehealth: Payer: Self-pay

## 2021-10-30 ENCOUNTER — Telehealth: Payer: Self-pay | Admitting: Hospice and Palliative Medicine

## 2021-10-30 NOTE — Telephone Encounter (Signed)
I had a breeze conversation via phone with Sonia Side [phone]).  He reports that patient's pain has been poorly controlled and that he has "lost confidence in this hospital".  He says that he has patient at [facility] ER right now with request that she be admitted.  I encouraged him to call back if there was anything we can do to assist. ?

## 2021-10-30 NOTE — Telephone Encounter (Signed)
PA for Fentanyl 50 mcg patch submitted via Cover My Meds (Key: [id number]) ?

## 2021-10-30 NOTE — Telephone Encounter (Signed)
Request Reference Number: [id number]. FENTANYL DIS 50MCG/HR is approved through 06/16/2022. ?

## 2021-10-31 MED FILL — Fosaprepitant Dimeglumine For IV Infusion 150 MG (Base Eq): INTRAVENOUS | Qty: 5 | Status: AC

## 2021-10-31 MED FILL — Dexamethasone Sodium Phosphate Inj 100 MG/10ML: INTRAMUSCULAR | Qty: 1 | Status: AC

## 2021-11-01 ENCOUNTER — Ambulatory Visit: Payer: Medicare Other | Admitting: Internal Medicine

## 2021-11-01 ENCOUNTER — Ambulatory Visit: Payer: Medicare Other

## 2021-11-01 ENCOUNTER — Telehealth: Payer: Self-pay | Admitting: Nurse Practitioner

## 2021-11-01 ENCOUNTER — Inpatient Hospital Stay: Payer: Medicare Other | Admitting: Internal Medicine

## 2021-11-01 ENCOUNTER — Other Ambulatory Visit: Payer: Medicare Other | Admitting: Nurse Practitioner

## 2021-11-01 ENCOUNTER — Inpatient Hospital Stay: Payer: Medicare Other

## 2021-11-01 ENCOUNTER — Other Ambulatory Visit: Payer: Medicare Other

## 2021-11-01 NOTE — Telephone Encounter (Signed)
I called for scheduled initial pc visit, no answer, message left with contact information

## 2021-11-13 ENCOUNTER — Telehealth: Payer: Self-pay | Admitting: Nurse Practitioner

## 2021-11-13 NOTE — Telephone Encounter (Signed)
Spoke with patient and have rescheduled the Palliative Consult for 11/15/21 @ 2 PM (post hospital discharge).

## 2021-11-15 ENCOUNTER — Other Ambulatory Visit: Payer: Medicare Other | Admitting: Nurse Practitioner

## 2021-11-15 ENCOUNTER — Encounter: Payer: Self-pay | Admitting: Nurse Practitioner

## 2021-11-15 DIAGNOSIS — R5381 Other malaise: Secondary | ICD-10-CM

## 2021-11-15 DIAGNOSIS — C210 Malignant neoplasm of anus, unspecified: Secondary | ICD-10-CM

## 2021-11-15 DIAGNOSIS — Z515 Encounter for palliative care: Secondary | ICD-10-CM

## 2021-11-15 NOTE — Progress Notes (Signed)
[facility] Consult Note Telephone: [phone]  Fax: [phone]   Date of encounter: 11/15/21 9:53 PM PATIENT NAME: Mariah Rogers Mariah Rogers [id number]   [phone] (home)  DOB: 06/21/1950 MRN: [id number] PRIMARY CARE PROVIDER:    Letta Median, MD,  [place] [address] [phone]  REFERRING PROVIDER:   Letta Median, MD [place],  [place] [id number]  RESPONSIBLE PARTY:    Contact Information     Name Relation Home Work Mobile   Mariah Rogers [phone]        Due to the COVID-19 crisis, this visit was done via telemedicine from my office and it was initiated and consent by this patient and or family.  I connected with  Mariah Rogers on 11/15/21 by telephone as video not available enabled telemedicine application and verified that I am speaking with the correct person using two identifiers.   I discussed the limitations of evaluation and management by telemedicine. The patient expressed understanding and agreed to proceed. Palliative Care was asked to follow this patient by consultation request of  Mariah Rogers, Mariah Cal, MD to address advance care planning and complex medical decision making. This is the initial visit.                    ASSESSMENT AND PLAN / RECOMMENDATIONS:  Symptom Management/Plan: 1. Advance Care Planning;  pending further discussion, wishes to continue with treatment  2. Goals of Care: Goals include to maximize quality of life and symptom management. Our advance care planning conversation included a discussion about:    The value and importance of advance care planning  Exploration of personal, cultural or spiritual beliefs that might influence medical decisions  Exploration of goals of care in the event of a sudden injury or illness  Identification and preparation of a healthcare agent   Review and updating or creation of an advance directive document 3. Debility secondary to Metastatic squamous cell carcinoma to anus; discussed fall risk; pain as being managed by J.Borders NP;  4. Palliative care encounter; Palliative care encounter; Palliative medicine team will continue to support patient, patient's family, and medical team. Visit consisted of counseling and education dealing with the complex and emotionally intense issues of symptom management and palliative care in the setting of serious and potentially life-threatening illness     Oncology History Overview Note   # [facility] 2022- ANAL CA- EXCISIONAL BIOPSY:  - INVASIVE MODERATELY DIFFERENTIATED SQUAMOUS CELL CARCINOMA, PRESENT AT DEEP AND LATERAL TISSUE EDGES. [Dr.Sakai]   DIAGNOSIS:  A. ANAL LESION; BIOPSY:  - INVASIVE SQUAMOUS CELL CARCINOMA.   Comment:  Invasive keratinizing squamous cell carcinoma, moderately  differentiated, is present in multiple fragments, extending to the deep  and peripheral edges of the tissue.  IMPRESSION: 1. Metastatic anal cancer as evidenced by a hypermetabolic anal mass, hypermetabolic left lower lobe nodule, hypermetabolic lymph nodes in the chest/pelvis and a probable left sacral metastasis.   # May 18th- CARBO-TAXOL q 3 W; udenyca   # PVD [s/p Stent; Dr.Dew]on Eliquis; active smoker;    # SURVIVORSHIP:    # GENETICS:    DIAGNOSIS:    STAGE:         ;  GOALS:   CURRENT/MOST RECENT THERAPY :       Cancer of anal canal (Mariah Rogers)   08/18/2020 Initial Diagnosis     Cancer  of anal canal (Mariah Rogers)     09/29/2020 - 11/03/2020 Chemotherapy     Patient is on Treatment Plan : ANUS Mitomycin D1,28 / 5FU D1-4, 28-31 q32d          10/15/2021 Cancer Staging     Staging form: Anus, AJCC 8th Edition - Clinical: Stage IV (cT2, cN1c, cM1) - Signed by Mariah Sickle, MD on 10/15/2021       10/15/2021 -  Chemotherapy     Patient is on Treatment Plan : anal cancer- Carboplatin +  Paclitaxel q21d            Follow up Palliative Care Visit: Palliative care will continue to follow for complex medical decision making, advance care planning, and clarification of goals. Return 4 weeks or prn. I spent 35 minutes providing this consultation. More than 50% of the time in this consultation was spent in counseling and care coordination. PPS: 50% Chief Complaint: Initial palliative consult for complex medical decision making HISTORY OF PRESENT ILLNESS:  Mariah Rogers is a 71 y.o. year old female  with multiple medical problems including stage IV anal cancer s/p chemo/XRT (2022), recurrent squamous cell carcinoma (09/2021), COPD. I called Mariah Rogers and son Mariah Rogers on conference call for initial telemedicine telephonic as video not available. We talked about purpose of pc visit. We talked about past medical history, ros, symptoms including pain, pain regimen. We talked about functional abilities. We talked about medical goals, family, social history; We talked about appetite; overall decline, quality of life. We talked about f/u in person pc visit to further assess needs, both in agreement, scheduled.   History obtained from review of EMR, discussion with Mariah Rogers.  I reviewed available labs, medications, imaging, studies and related documents from the EMR.  Records reviewed and summarized above.   ROS 10 point system reviewed all negative except HPI  Physical Exam: deferred CURRENT PROBLEM LIST:  Patient Active Problem List   Diagnosis Date Noted   Diarrhea 10/30/2020   Cancer of anal canal ([place]) 08/18/2020   Family history of malignant neoplasm of gastrointestinal tract    Polyp of colon    Gastroesophageal reflux disease    Schatzki's ring of distal esophagus    Hiatal hernia    Primary osteoarthritis of left knee 01/08/2017   Ischemic leg 10/02/2016   PAD (peripheral artery disease) ([place]) 09/30/2016   Carotid stenosis 09/27/2016   Atherosclerosis of native arteries of  extremity with intermittent claudication ([place]) 09/27/2016   Personal history of tobacco use, presenting hazards to health 09/25/2016   Hypertension 03/29/2015   Hyperlipidemia 03/29/2015   Tobacco abuse 03/29/2015   IHD (ischemic heart disease) 09/28/2014   PAST MEDICAL HISTORY:  Active Ambulatory Problems    Diagnosis Date Noted   Hypertension 03/29/2015   Hyperlipidemia 03/29/2015   Tobacco abuse 03/29/2015   IHD (ischemic heart disease) 09/28/2014   Personal history of tobacco use, presenting hazards to health 09/25/2016   Carotid stenosis 09/27/2016   Atherosclerosis of native arteries of extremity with intermittent claudication ([place]) 09/27/2016   PAD (peripheral artery disease) ([place]) 09/30/2016   Ischemic leg 10/02/2016   Primary osteoarthritis of left knee 01/08/2017   Family history of malignant neoplasm of gastrointestinal tract    Polyp of colon    Gastroesophageal reflux disease    Schatzki's ring of distal esophagus    Hiatal hernia    Cancer of anal canal ([place]) 08/18/2020   Diarrhea 10/30/2020   Resolved Ambulatory Problems  Diagnosis Date Noted   No Resolved Ambulatory Problems   Past Medical History:  Diagnosis Date   Arthritis    CAD (coronary artery disease)    Cancer (HCC)    COPD (chronic obstructive pulmonary disease) (HCC)    Depression    Diabetes mellitus without complication (HCC)    Dyspnea    Dysrhythmia    Emphysema lung (HCC)    GERD (gastroesophageal reflux disease)    Hernia, hiatal    Myocardial infarction ([place])    Peripheral vascular disease (HCC)    Pre-diabetes    Thrombosis    SOCIAL HX:  Social History   Tobacco Use   Smoking status: Every Day    Packs/day: 0.25    Years: 60.00    Pack years: 15.00    Types: Cigarettes   Smokeless tobacco: Never  Substance Use Topics   Alcohol use: No   FAMILY HX:  Family History  Problem Relation Age of Onset   Osteoporosis Mother    Alzheimer's disease Mother    Colon cancer  Daughter 24      ALLERGIES:  Allergies  Allergen Reactions   Codeine Nausea And Vomiting   Metformin And Related Diarrhea     PERTINENT MEDICATIONS:  Outpatient Encounter Medications as of 11/15/2021  Medication Sig   albuterol (VENTOLIN HFA) 108 (90 Base) MCG/ACT inhaler Inhale 2 puffs into the lungs every 6 (six) hours as needed for wheezing or shortness of breath.   apixaban (ELIQUIS) 5 MG TABS tablet Take 1 tablet (5 mg total) by mouth 2 (two) times daily.   atorvastatin (LIPITOR) 80 MG tablet Take 80 mg by mouth daily.   clopidogrel (PLAVIX) 75 MG tablet Take 75 mg by mouth daily.   ezetimibe (ZETIA) 10 MG tablet Take 10 mg by mouth at bedtime.   fentaNYL (DURAGESIC) 50 MCG/HR Place 1 patch onto the skin every 3 (three) days.   fluticasone-salmeterol (ADVAIR) 250-50 MCG/ACT AEPB Inhale 1 puff into the lungs daily.   lidocaine (XYLOCAINE) 5 % ointment Apply 1 application. topically as needed. (Patient not taking: Reported on 09/25/2021)   lisinopril (ZESTRIL) 5 MG tablet Take 5 mg by mouth daily.   metoprolol tartrate (LOPRESSOR) 25 MG tablet Take 25 mg by mouth daily.   ondansetron (ZOFRAN) 8 MG tablet One pill every 8 hours as needed for nausea/vomitting.   oxyCODONE-acetaminophen (PERCOCET/ROXICET) 5-325 MG tablet Take 1-2 tablets by mouth every 4 (four) hours as needed for severe pain.   prochlorperazine (COMPAZINE) 10 MG tablet Take 1 tablet (10 mg total) by mouth every 6 (six) hours as needed for nausea or vomiting.   sertraline (ZOLOFT) 100 MG tablet Take 1 tablet (100 mg total) by mouth 2 (two) times daily.   SPIRIVA HANDIHALER 18 MCG inhalation capsule 18 mcg daily.   No facility-administered encounter medications on file as of 11/15/2021.   Thank you for the opportunity to participate in the care of Ms. Bahri.  The palliative care team will continue to follow. Please call our office at [phone] if we can be of additional assistance.   Alyssabeth Bruster Z Blakeley Scheier, NP ,

## 2021-11-19 ENCOUNTER — Ambulatory Visit: Payer: Medicare Other | Admitting: Internal Medicine

## 2021-11-19 ENCOUNTER — Other Ambulatory Visit: Payer: Medicaid Other

## 2021-11-19 ENCOUNTER — Ambulatory Visit: Payer: Medicare Other | Admitting: Radiation Oncology

## 2021-11-23 ENCOUNTER — Ambulatory Visit: Payer: Medicare Other | Admitting: Radiation Oncology

## 2021-11-28 ENCOUNTER — Encounter: Payer: Self-pay | Admitting: Nurse Practitioner

## 2021-11-28 ENCOUNTER — Other Ambulatory Visit: Payer: Medicare Other | Admitting: Nurse Practitioner

## 2021-11-28 DIAGNOSIS — R110 Nausea: Secondary | ICD-10-CM

## 2021-11-28 DIAGNOSIS — C210 Malignant neoplasm of anus, unspecified: Secondary | ICD-10-CM

## 2021-11-28 DIAGNOSIS — G893 Neoplasm related pain (acute) (chronic): Secondary | ICD-10-CM

## 2021-11-28 DIAGNOSIS — Z515 Encounter for palliative care: Secondary | ICD-10-CM

## 2021-11-28 DIAGNOSIS — R630 Anorexia: Secondary | ICD-10-CM

## 2021-11-28 NOTE — Progress Notes (Signed)
[facility] Consult Note Telephone: [phone]  Fax: [phone]    Date of encounter: 11/28/21 4:12 PM PATIENT NAME: Mariah Rogers [place] [address]   [phone] (home)  DOB: [birth year] MRN: [id number] PRIMARY CARE PROVIDER:    Letta Median, MD,  [place] [address] [phone]  RESPONSIBLE PARTY:    Contact Information     Name Relation Home Work Mobile   Clinton Quant [phone]        I met face to face with patient and family in home. Palliative Care was asked to follow this patient by consultation request of  Bender, Durene Cal, MD to address advance care planning and complex medical decision making. This is a follow up visit.                                  ASSESSMENT AND PLAN / RECOMMENDATIONS:  Symptom Management/Plan: 1. Advance Care Planning;  Wishes for comfort care, wishes to cancel chemotherapy appointment and proceed with Hospice services. Notified Dr Joyce Gross Oncology Called Dr Rebeca Alert primary, received order to proceed with hospice but does not wish to be attending. Notified hospice.   2. Anorexia; nausea secondary to anal cancer, continue to encourage foods that Mariah Rogers likes, nutritional counseling, supportive care, comfort foods; zofran for nauseas  3. Pain: chronic secondary to anal cancer. Discussed current pain regimen: will increase gabapentin as it has helped the pain. Currently remains on Fentanyl patch 64mg q72; Oxycodone 5/3251mq4hrs.   Rx: Gabapentin 3009mid; #90; No RF escribed  4. 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 Discussed at length, grieving, coping strategies; discussed medication options, agree to try SSRI  Rx: Celexa 70m23m; #30; No RF escribed  5. Goals of Care: Goals include to maximize quality of life and symptom management. Our advance care planning conversation included a discussion about:    The value and  importance of advance care planning  Exploration of personal, cultural or spiritual beliefs that might influence medical decisions  Exploration of goals of care in the event of a sudden injury or illness  Identification and preparation of a healthcare agent  Review and updating or creation of an advance directive document.  6. Palliative care encounter; Palliative care encounter; Palliative medicine team will continue to support patient, patient's family, and medical team. 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 Incisional bx demonstrated invasive moderately differentiated SCC. 09/04/20, PET showed marked hypermetabolic activity in the anal region. Tiny b/l inguinal LNs show dicernible FDG accumulation. 7 mm RLL lung nodule stable from June 2021. 09/18/20 - 11/08/20, completed definitive chemoradiation with 5-FU/[facility] 01/22/21, PET showed minimal residual hypermetabolism in the anal region but no discrete mass. No metastatic disease. Stable RML and RLL pulmonary nodules. 10/04/21, C CAP showed abnormal soft tissue enhancement in the region of the anus measuring  3.2 x 1.9 x 3.6 cm, concerning for recurrent disease. New left external iliac lymphadenopathy, enlarged right hilar lymph nodes, and prominent subcarinal and left hilar lymph nodes. New 8 mm left lower lobe pulmonary nodule concerning for  metastasis. 10/11/21, PET showed hypermetabolic anal mass, hypermetabolic LLL nodule and LNs in chest/pelvis. Left adrenal adenoma. 11/03/21, MRI AP showed heterogeneous masslike lesion in the anal canal suspicious for recurrent tumor,. 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  11/05/21, underwent transverse loop colostomy with Dr. Hassell Done for palliation of intractable and perineal discomfort with bowel movements   I spent 62 minutes providing this consultation. More than 50% of the time in this consultation was spent in counseling and care coordination. PPS: 40% Chief Complaint: Follow up palliative consult for complex medical decision making  HISTORY OF PRESENT ILLNESS:  Mariah Rogers is a 71 y.o. 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 I contacted JR, Mariah Rogers son to confirm PC visit, in agreement. I met with Mariah Rogers son Mariah Rogers, room-mate and Mariah Rogers in tier home. We talked about past medical history, dx cancer with treatment and currently scheduled to have chemo in 2 days. We talked about medical goals concerning wishes. Reviewed Oncology note with Mariah Rogers, son. We talked about palliative not curative per Oncology note. We talked about side effects of chemotherapy. Mariah Rogers endorses she becomes very sick with chemotherapy. We talked about quality of life vs quantity of days. We talked about things that bring Mariah Rogers joy. Mariah Rogers endorses she no longer wishes to go through any further treatment, wishes to cancel Oncology visit, called Dr Tarry Kos office and cancelled per wishes. Mariah Rogers was very specific that she did not wish to pursue further treatments including chemotherapy, son and room-mate in agreement. We talked about hospice benefit under medicare program, what services are provided. Mariah Rogers  endorses wishes are to proceed with hospice. We talked about ros including nausea, controlled with zofran. We talked about appetite which has been decreased with weight loss. Nutrition discussed. We talked about pain for which she has been taking Fentanyl patch, oxycodone, gabapentin; plan as above. We talked about depression, coping strategies, medications options. Started Celexa as above. We talked about role pc in poc. We talked about expectation with disease progression. Therapeutic listening, emotional support provided. Question answered.   History obtained from review of EMR, discussion with son, Mariah Rogers and roommate with Mariah Rogers.  I reviewed available labs, medications, imaging, studies and related documents from the EMR.  Records reviewed and summarized above.   ROS 10 point system reviewed all negative except +pain; +fatigue, +weakness, +nausea  Physical Exam: Current and past weights: Constitutional: NAD General: frail appearing,  EYES: lids intact ENMT: oral mucous membranes moist CV: S1S2, RRR Pulmonary: LCTA, no increased work of breathing, no cough, room air Abdomen: soft and non tender; Ostomy MSK: steps with max assistance and walker  Skin: warm and dry Neuro:  no generalized weakness, + cognitive impairment Psych: non-anxious affect, Alert, confused Thank you for the opportunity to participate in the care of Ms. Danley.  The palliative care team will continue to follow. Please call our office at [phone] if we can be of additional assistance.   Raissa Dam Z Coriann Brouhard, NP   COVID-19 PATIENT SCREENING TOOL Asked and negative response unless  otherwise noted:   Have you had symptoms of covid, tested positive or been in contact with someone with symptoms/positive test in the past 5-10 days? no

## 2021-11-29 ENCOUNTER — Ambulatory Visit: Payer: Medicare Other | Attending: Radiation Oncology | Admitting: Radiation Oncology

## 2021-12-15 DEATH — deceased

## 2021-12-24 ENCOUNTER — Other Ambulatory Visit: Payer: Commercial Managed Care - HMO

## 2021-12-24 ENCOUNTER — Ambulatory Visit: Payer: Self-pay | Admitting: Internal Medicine

## 2022-01-07 ENCOUNTER — Other Ambulatory Visit: Payer: Self-pay

## 2022-01-11 ENCOUNTER — Ambulatory Visit (INDEPENDENT_AMBULATORY_CARE_PROVIDER_SITE_OTHER): Payer: Medicare Other | Admitting: Vascular Surgery

## 2022-01-11 ENCOUNTER — Encounter (INDEPENDENT_AMBULATORY_CARE_PROVIDER_SITE_OTHER): Payer: Medicare Other

## 2022-01-19 ENCOUNTER — Other Ambulatory Visit: Payer: Self-pay

## 2022-03-26 IMAGING — CT CT ABDOMEN W/O CM
2 of 4 series · 15 of 46 positions shown, 17 images · non-contrast
Comparison: PET-CT 01/22/2021.

CLINICAL DATA: 71-year-old female with history of anal cancer. Left
adrenal lesion.

EXAM:
CT ABDOMEN WITHOUT CONTRAST
TECHNIQUE: Multidetector CT imaging of the abdomen was performed following the
standard protocol without IV contrast.
RADIATION DOSE REDUCTION: This exam was performed according to the
departmental dose-optimization program which includes automated
exposure control, adjustment of the mA and/or kV according to
patient size and/or use of iterative reconstruction technique.

[Series 2: axial st adrenals 2.00 · axial · 0.64mm/px · z∈[-1295,-1061]mm · 12 of 129 slices shown, 14 images]
[im 6/129  soft-tissue]
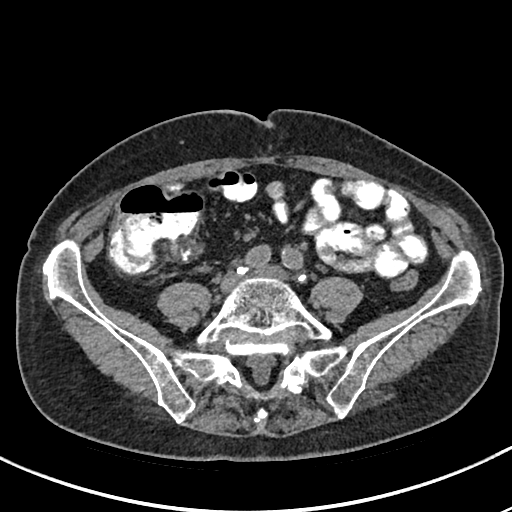
[im 6/129  bone]
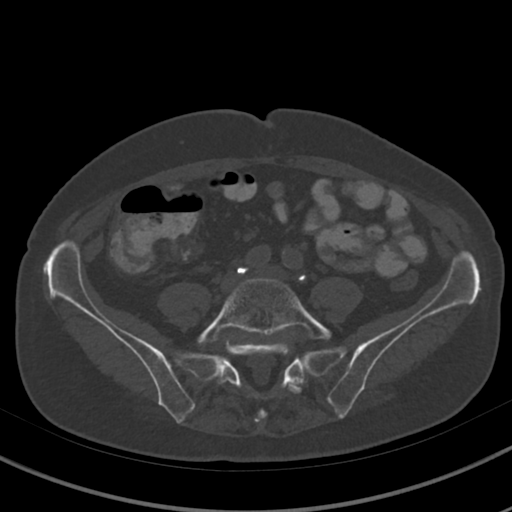
[im 18/129  soft-tissue]
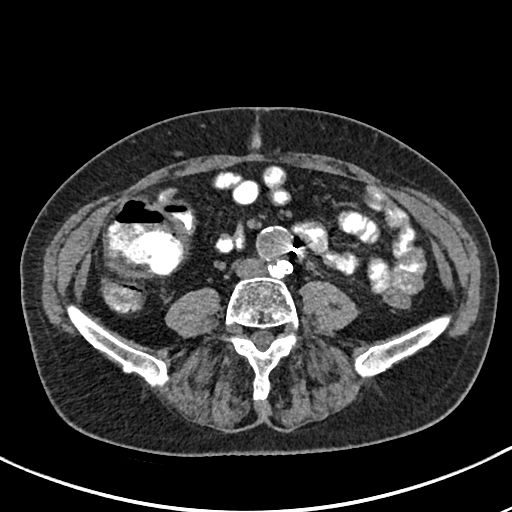
[im 30/129  soft-tissue]
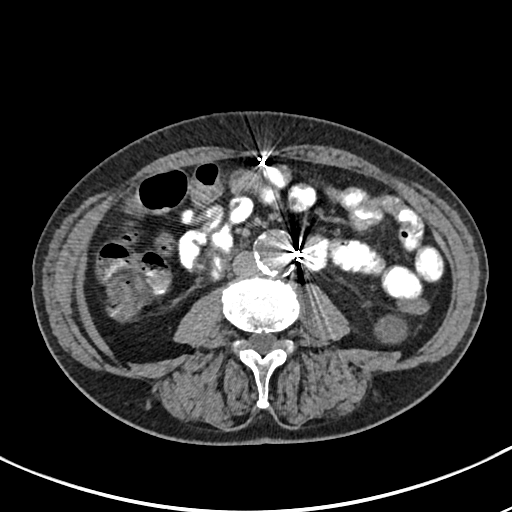
[im 41/129  soft-tissue]
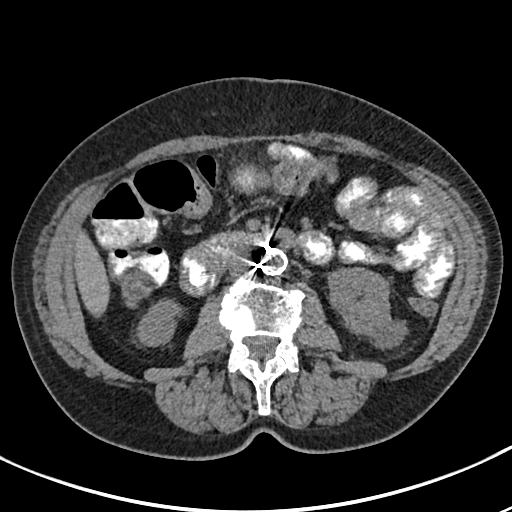
[im 47/129  soft-tissue]
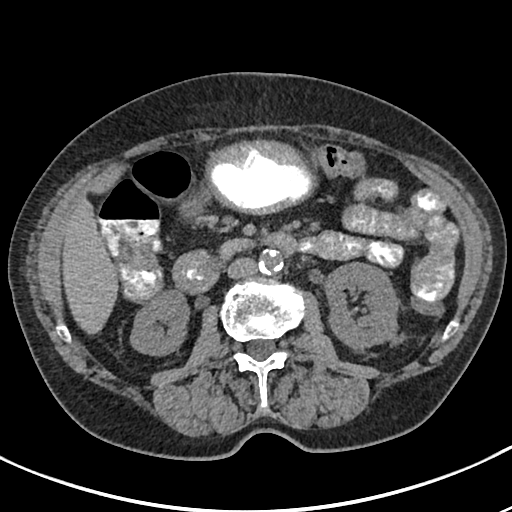
[im 59/129  soft-tissue]
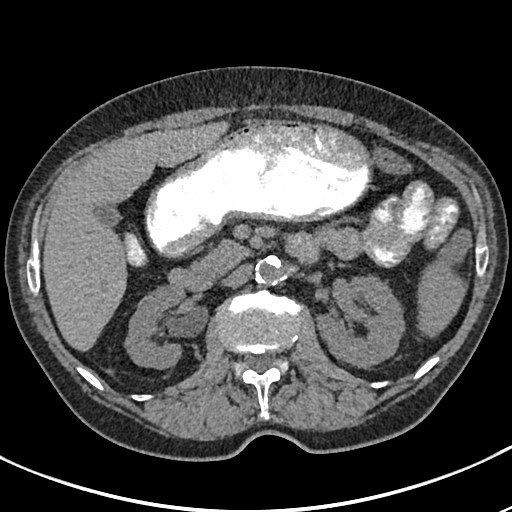
[im 70/129  soft-tissue]
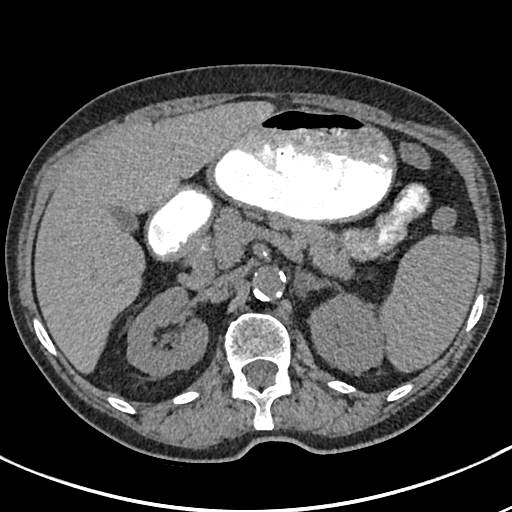
[im 82/129  soft-tissue]
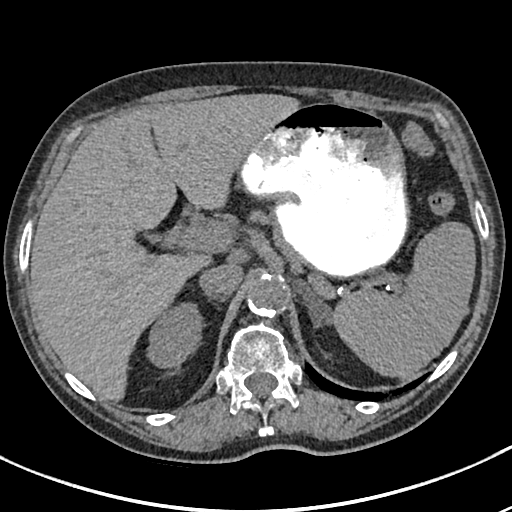
[im 88/129  soft-tissue]
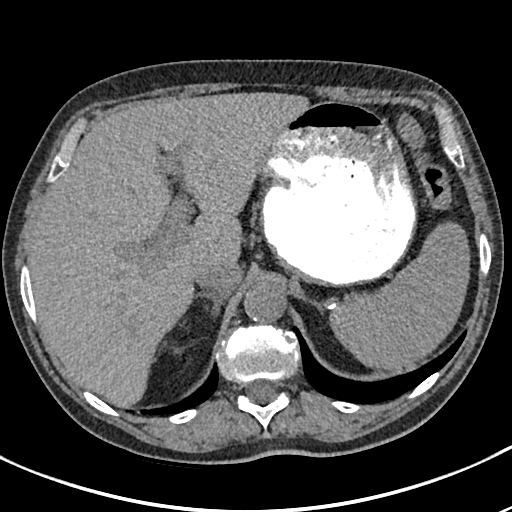
[im 88/129  bone]
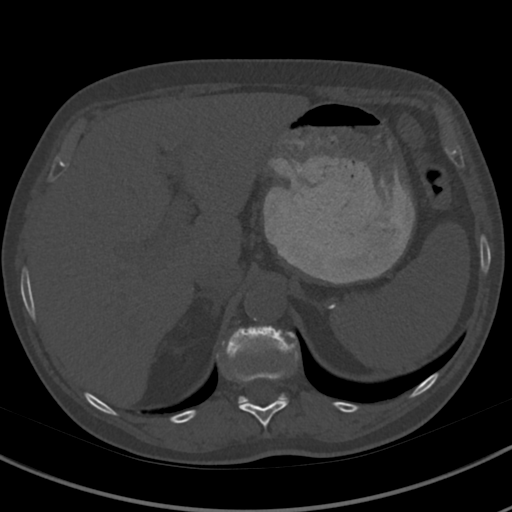
[im 99/129  soft-tissue]
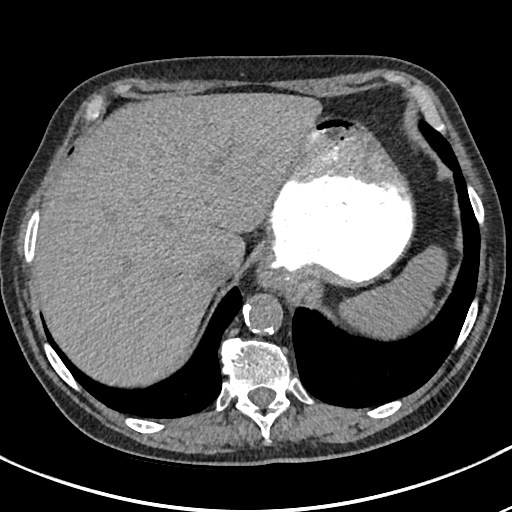
[im 111/129  soft-tissue]
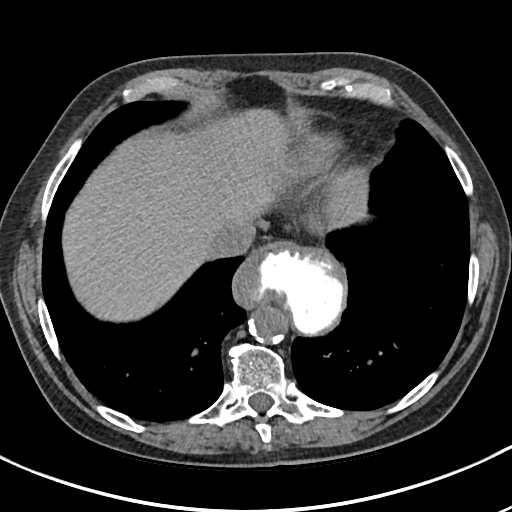
[im 123/129  soft-tissue]
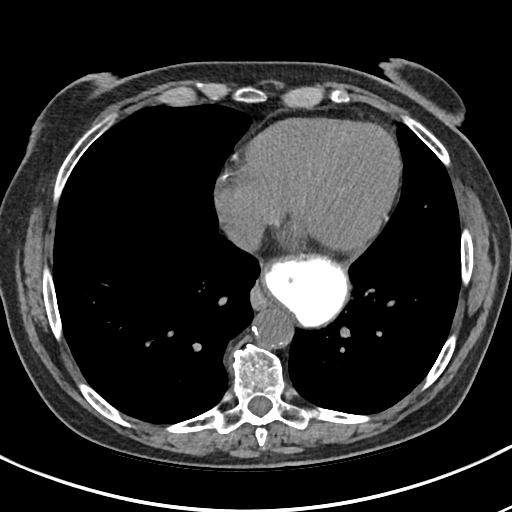

[Series 5: cor st adrenals 2.00 cor · coronal · 0.51mm/px · 3 of 139 slices shown]
[im 47/139  soft-tissue]
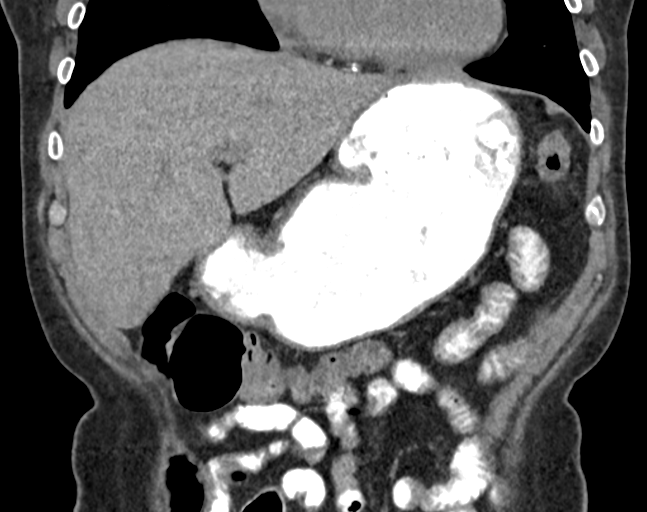
[im 62/139  soft-tissue]
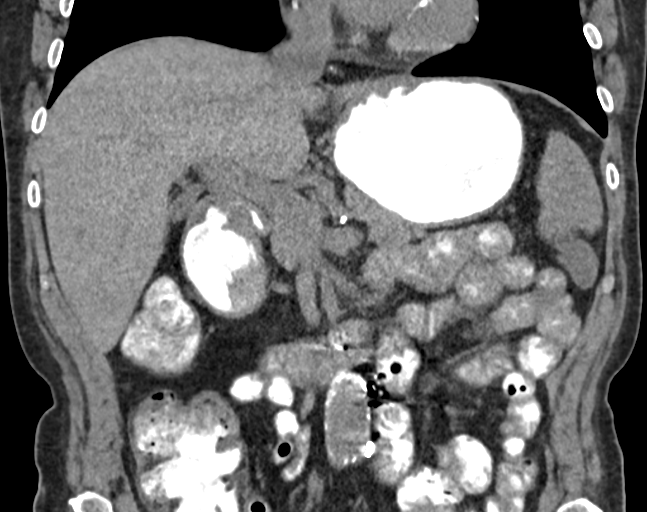
[im 77/139  soft-tissue]
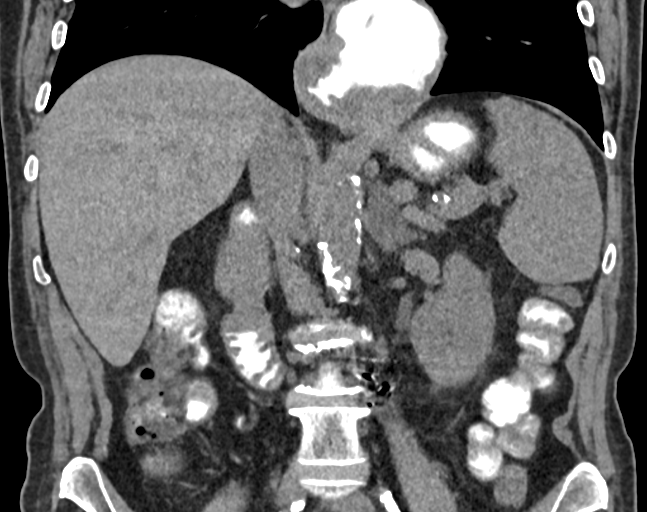

[15 of 46 positions shown; findings below may reference images not displayed]

FINDINGS: Lower chest: Large hiatal hernia. Atherosclerotic calcifications in
the descending thoracic aorta as well as the left anterior
descending, left circumflex and right coronary arteries.

Hepatobiliary: No definite suspicious cystic or solid hepatic
lesions are confidently identified on today's noncontrast CT
examination. Unenhanced appearance of the gallbladder is normal.

Pancreas: No definite pancreatic mass or peripancreatic fluid
collections or inflammatory changes are noted on today's noncontrast
CT examination.

Spleen: Unremarkable.

Adrenals/Urinary Tract: 3.1 cm exophytic low-attenuation lesion in
the lower pole of the left kidney, incompletely characterized on
today's non-contrast CT examination, but statistically likely to
represent a cyst. Right kidney and right adrenal gland are normal in
appearance. In the left adrenal gland there is a 2.4 x 1.5 cm
low-attenuation (4 HU) nodule, compatible with a benign adenoma. No
hydroureteronephrosis in the visualized portions of the abdomen.

Stomach/Bowel: Intra-abdominal portion of the stomach is normal. No
pathologic dilatation of visualized portions of small bowel or
colon.

Vascular/Lymphatic: Postoperative changes of aortobifemoral bypass
graft, incompletely imaged. Extensive aortic atherosclerosis. No
lymphadenopathy noted in the abdomen.

Other: No significant volume of ascites and no pneumoperitoneum
noted in the visualized portions of the peritoneal cavity.

Musculoskeletal: No aggressive appearing osseous lesions are noted
in the visualized portions of the skeleton.
IMPRESSION: 1. 2.4 x 1.5 cm left adrenal adenoma.
2. Large hiatal hernia.
3. Aortic atherosclerosis, in addition to at least 3 vessel coronary
artery disease. Assessment for potential risk factor modification,
dietary therapy or pharmacologic therapy may be warranted, if
clinically indicated.
4. Additional incidental findings, as above.

## 2022-05-21 IMAGING — PT NM PET TUM IMG RESTAG (PS) SKULL BASE T - THIGH
8 series · 25 of 25 positions shown · non-contrast
Comparison: CT chest abdomen pelvis 10/04/2021 and PET 01/22/2021.

CLINICAL DATA: Subsequent treatment strategy for anal cancer.

EXAM:
NUCLEAR MEDICINE PET SKULL BASE TO THIGH
TECHNIQUE: 7.0 mCi F-18 FDG was injected intravenously. Full-ring PET imaging
was performed from the skull base to thigh after the radiotracer. CT
data was obtained and used for attenuation correction and anatomic
localization.
Fasting blood glucose: 109 mg/dl

[Series 2: ct slices · axial · 3.8mm · 1.37mm/px · z∈[-857,+0]mm · 6 of 261 slices shown]
[im 1/261]
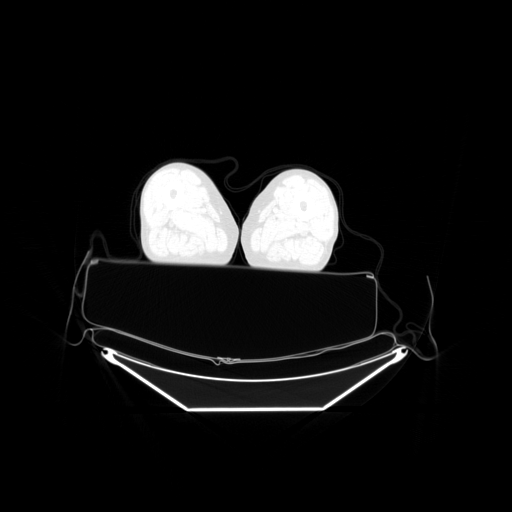
[im 53/261]
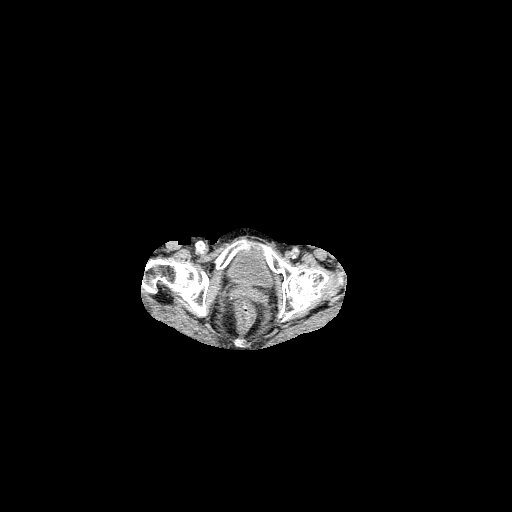
[im 105/261]
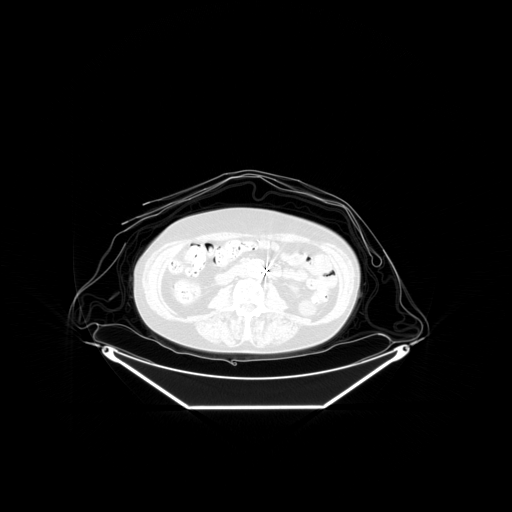
[im 157/261]
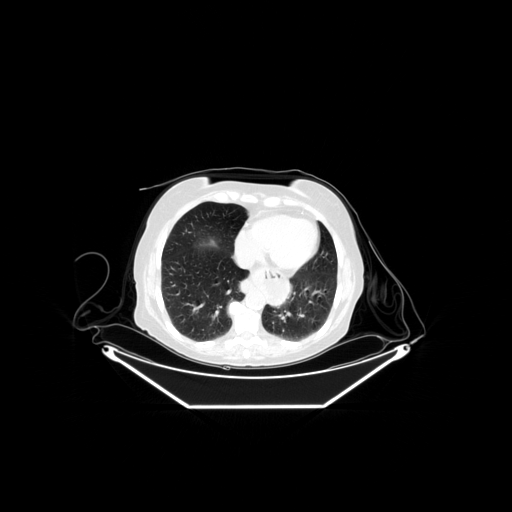
[im 209/261]
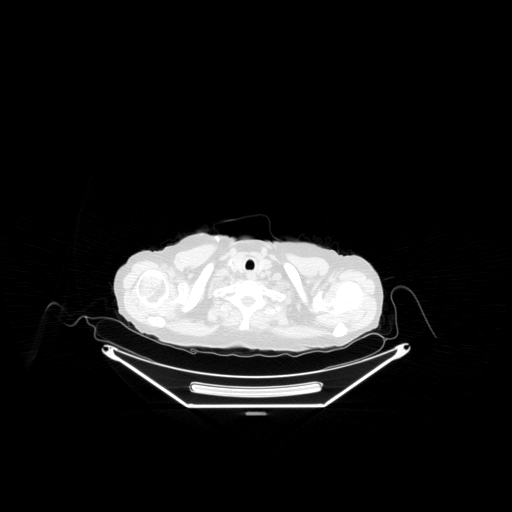
[im 261/261]
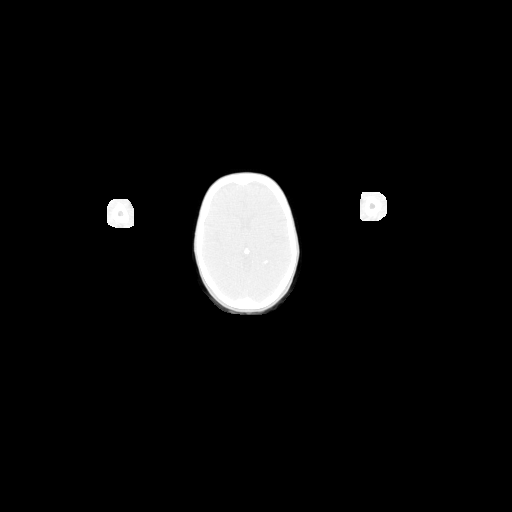

[Series 3: pet ac 3d body · axial · 3.3mm · 5.47mm/px · z∈[-857,+0]mm · 5 of 263 slices shown]
[im 1/263]
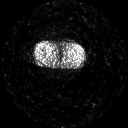
[im 66/263]
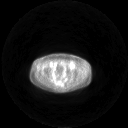
[im 132/263]
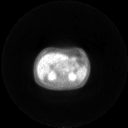
[im 197/263]
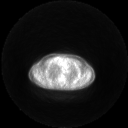
[im 263/263]
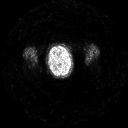

[Series 4: pet nac 3d body · axial · 3.3mm · 5.47mm/px · z∈[-857,+0]mm · 5 of 263 slices shown]
[im 1/263]
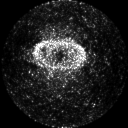
[im 66/263]
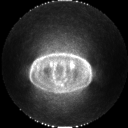
[im 132/263]
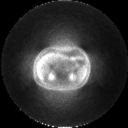
[im 197/263]
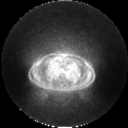
[im 263/263]
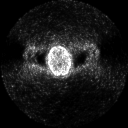

[Series 302: coronal fused · coronal · 5.5mm · 6.88mm/px · 1 of 55 slices shown]
[im 1/55]
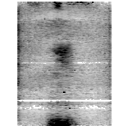

[Series 303: pet axial · axial · 3.3mm · 5.47mm/px · z∈[-857,+0]mm · 5 of 263 slices shown]
[im 1/263]
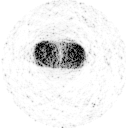
[im 66/263]
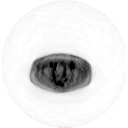
[im 132/263]
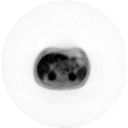
[im 197/263]
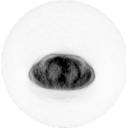
[im 263/263]
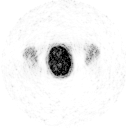

[Series 304: pet sagittal · sagittal · 5.5mm · 6.88mm/px · 1 of 74 slices shown]
[im 1/74]
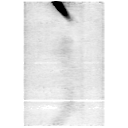

[Series 305: pet coronal · coronal · 5.5mm · 6.88mm/px · 1 of 52 slices shown]
[im 1/52]
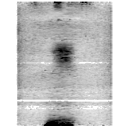

[Series 8257: results mm oncology reading · 5.0mm · 1.06mm/px · 1 of 11 slices shown]
[im 1/11]
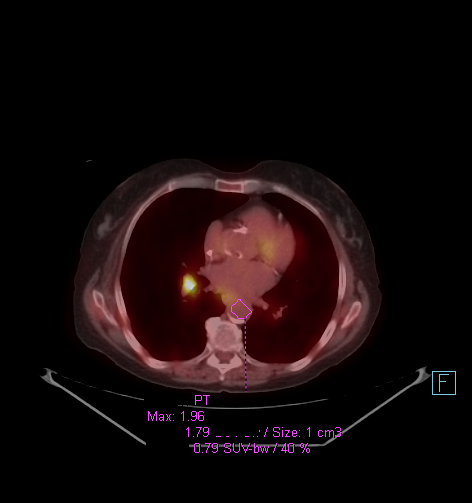

[25 of 25 positions shown; findings below may reference images not displayed]

FINDINGS: Mediastinal blood pool activity: SUV max

Liver activity: SUV max NA

NECK:

No abnormal hypermetabolism.

Incidental CT findings:

None.

CHEST:

Hypermetabolic left scalene muscle, likely physiologic.
Hypermetabolic subcarinal lymph nodes measure up to 9 mm, SUV max
7.2. Hypermetabolic bihilar lymph nodes, SUV max 9.9 on the right.
Corresponding lymph nodes better measured on 10/04/2021. 8 mm left
lower lobe nodule (2/108), SUV max 2.5.

Incidental CT findings:

Right IJ Port-A-Cath terminates in the right atrium. Atherosclerotic
calcification of the aorta, aortic valve and coronary arteries.
Heart is at the upper limits of normal in size. No pericardial or
pleural effusion.

ABDOMEN/PELVIS:

No abnormal hypermetabolism in the liver, adrenal glands, spleen or
pancreas. Hypermetabolic anal mass measures approximately 2.9 x
cm (2/221), SUV max 14.8. Left perirectal hypermetabolic lymph node
measuring 10 mm (2/210), SUV max 5.9.

Left external iliac lymph nodes measure up to 9 mm (2/192), SUV max
11.4. Hypermetabolic inguinal lymph nodes are seen bilaterally, with
an index lymph node on the left measuring 11 mm (2/223), SUV max
8.4.

Incidental CT findings:

Liver, gallbladder and right adrenal gland are unremarkable. Left
adrenal nodule measures 1.8 x 2.9 cm and 6 Hounsfield units. No
follow-up necessary. Right kidney is unremarkable. Exophytic
low-density lesion off the lower pole left kidney measures 2.7 cm,
compatible with a cyst. No follow-up necessary. Spleen and pancreas
are unremarkable. Moderate hiatal hernia. Aorto bi-iliac bypass
graft.

SKELETON:

Focal hypermetabolism in the lateral left sacrum has an SUV max 4.6.
No definite CT correlate. Finding is new. No additional abnormal
hypermetabolism.

Incidental CT findings:

Degenerative changes in the spine. Bilateral L5 pars defects.
Degenerative changes in the spine.
IMPRESSION: 1. Metastatic anal cancer as evidenced by a hypermetabolic anal
mass, hypermetabolic left lower lobe nodule, hypermetabolic lymph
nodes in the chest/pelvis and a probable left sacral metastasis.
2. Left adrenal adenoma.
3. Moderate hiatal hernia.
4. Aortic atherosclerosis ([id number]). Coronary artery
calcification.
